# Patient Record
Sex: Male | Born: 1960
Health system: Southern US, Community
[De-identification: ages and names within clinical notes are randomized; demographics above are authoritative.]

## PROBLEM LIST (undated history)

## (undated) DIAGNOSIS — D649 Anemia, unspecified: Secondary | ICD-10-CM

## (undated) DIAGNOSIS — K219 Gastro-esophageal reflux disease without esophagitis: Secondary | ICD-10-CM

## (undated) DIAGNOSIS — F17200 Nicotine dependence, unspecified, uncomplicated: Secondary | ICD-10-CM

## (undated) DIAGNOSIS — N529 Male erectile dysfunction, unspecified: Secondary | ICD-10-CM

## (undated) DIAGNOSIS — I10 Essential (primary) hypertension: Secondary | ICD-10-CM

## (undated) DIAGNOSIS — G379 Demyelinating disease of central nervous system, unspecified: Secondary | ICD-10-CM

## (undated) DIAGNOSIS — R972 Elevated prostate specific antigen [PSA]: Secondary | ICD-10-CM

## (undated) HISTORY — DX: Nicotine dependence, unspecified, uncomplicated: F17.200

## (undated) HISTORY — DX: Male erectile dysfunction, unspecified: N52.9

## (undated) HISTORY — DX: Demyelinating disease of central nervous system, unspecified: G37.9

## (undated) HISTORY — DX: Elevated prostate specific antigen (PSA): R97.20

## (undated) HISTORY — DX: Essential (primary) hypertension: I10

## (undated) HISTORY — DX: Anemia, unspecified: D64.9

## (undated) HISTORY — PX: COLONOSCOPY: SHX174

## (undated) HISTORY — PX: UPPER GASTROINTESTINAL ENDOSCOPY: SHX188

## (undated) HISTORY — DX: Gastro-esophageal reflux disease without esophagitis: K21.9

---

## 2000-06-27 ENCOUNTER — Encounter: Payer: Self-pay | Admitting: Family Medicine

## 2001-07-03 ENCOUNTER — Encounter: Payer: Self-pay | Admitting: Family Medicine

## 2001-07-03 ENCOUNTER — Ambulatory Visit (HOSPITAL_COMMUNITY): Admission: RE | Admit: 2001-07-03 | Discharge: 2001-07-03 | Payer: Self-pay | Admitting: Family Medicine

## 2003-06-08 ENCOUNTER — Ambulatory Visit (HOSPITAL_COMMUNITY): Admission: RE | Admit: 2003-06-08 | Discharge: 2003-06-08 | Payer: Self-pay | Admitting: Family Medicine

## 2004-06-14 ENCOUNTER — Ambulatory Visit: Payer: Self-pay | Admitting: Family Medicine

## 2004-07-25 ENCOUNTER — Ambulatory Visit: Payer: Self-pay | Admitting: Family Medicine

## 2004-11-29 ENCOUNTER — Ambulatory Visit: Payer: Self-pay | Admitting: Family Medicine

## 2005-03-27 ENCOUNTER — Ambulatory Visit: Payer: Self-pay | Admitting: Family Medicine

## 2005-06-16 ENCOUNTER — Ambulatory Visit: Payer: Self-pay | Admitting: Family Medicine

## 2005-08-15 ENCOUNTER — Ambulatory Visit: Payer: Self-pay | Admitting: Family Medicine

## 2005-10-10 HISTORY — PX: CHOLECYSTECTOMY: SHX55

## 2005-10-28 ENCOUNTER — Emergency Department (HOSPITAL_COMMUNITY): Admission: EM | Admit: 2005-10-28 | Discharge: 2005-10-28 | Payer: Self-pay | Admitting: Emergency Medicine

## 2005-10-31 ENCOUNTER — Emergency Department (HOSPITAL_COMMUNITY): Admission: EM | Admit: 2005-10-31 | Discharge: 2005-10-31 | Payer: Self-pay | Admitting: Emergency Medicine

## 2005-11-01 ENCOUNTER — Ambulatory Visit: Payer: Self-pay | Admitting: Family Medicine

## 2005-11-02 ENCOUNTER — Inpatient Hospital Stay (HOSPITAL_COMMUNITY): Admission: AD | Admit: 2005-11-02 | Discharge: 2005-11-04 | Payer: Self-pay | Admitting: General Surgery

## 2005-11-02 ENCOUNTER — Ambulatory Visit (HOSPITAL_COMMUNITY): Admission: RE | Admit: 2005-11-02 | Discharge: 2005-11-02 | Payer: Self-pay | Admitting: Family Medicine

## 2005-11-03 ENCOUNTER — Encounter (INDEPENDENT_AMBULATORY_CARE_PROVIDER_SITE_OTHER): Payer: Self-pay | Admitting: Specialist

## 2005-11-09 ENCOUNTER — Ambulatory Visit: Payer: Self-pay | Admitting: Family Medicine

## 2005-12-25 ENCOUNTER — Ambulatory Visit: Payer: Self-pay | Admitting: Family Medicine

## 2006-04-26 ENCOUNTER — Ambulatory Visit: Payer: Self-pay | Admitting: Family Medicine

## 2006-04-30 ENCOUNTER — Ambulatory Visit (HOSPITAL_COMMUNITY): Admission: RE | Admit: 2006-04-30 | Discharge: 2006-04-30 | Payer: Self-pay | Admitting: Family Medicine

## 2006-08-17 ENCOUNTER — Encounter: Payer: Self-pay | Admitting: Family Medicine

## 2006-08-17 LAB — CONVERTED CEMR LAB
BUN: 15 mg/dL (ref 6–23)
Basophils Absolute: 0.1 10*3/uL (ref 0.0–0.1)
Calcium: 9.6 mg/dL (ref 8.4–10.5)
Chloride: 101 meq/L (ref 96–112)
Cholesterol: 162 mg/dL (ref 0–200)
Eosinophils Absolute: 0.3 10*3/uL (ref 0.0–0.7)
Eosinophils Relative: 7 % — ABNORMAL HIGH (ref 0–5)
Glucose, Bld: 78 mg/dL (ref 70–99)
HCT: 38.9 % — ABNORMAL LOW (ref 39.0–52.0)
HDL: 46 mg/dL (ref >39)
Hemoglobin: 13.2 g/dL (ref 13.0–17.0)
Lymphocytes Relative: 31 % (ref 12–46)
MCV: 84 fL (ref 78.0–100.0)
Monocytes Absolute: 0.6 10*3/uL (ref 0.2–0.7)
Monocytes Relative: 12 % — ABNORMAL HIGH (ref 3–11)
Neutro Abs: 2.5 10*3/uL (ref 1.7–7.7)
PSA: 2.21 ng/mL (ref 0.10–4.00)
Platelets: 262 10*3/uL (ref 150–400)
RBC: 4.63 M/uL (ref 4.22–5.81)
RDW: 13.5 % (ref 11.5–14.0)
WBC: 5 10*3/uL (ref 4.0–10.5)

## 2006-08-24 ENCOUNTER — Ambulatory Visit: Payer: Self-pay | Admitting: Family Medicine

## 2007-02-05 ENCOUNTER — Ambulatory Visit: Payer: Self-pay | Admitting: Family Medicine

## 2007-06-13 ENCOUNTER — Encounter: Payer: Self-pay | Admitting: Family Medicine

## 2007-06-26 ENCOUNTER — Ambulatory Visit: Payer: Self-pay | Admitting: Family Medicine

## 2007-06-26 ENCOUNTER — Ambulatory Visit (HOSPITAL_COMMUNITY): Admission: RE | Admit: 2007-06-26 | Discharge: 2007-06-26 | Payer: Self-pay | Admitting: Family Medicine

## 2007-06-26 LAB — CONVERTED CEMR LAB
Nitrite: NEGATIVE
Protein, ur: NEGATIVE mg/dL
Specific Gravity, Urine: 1.024 (ref 1.005–1.03)
Urine Glucose: NEGATIVE mg/dL
Urobilinogen, UA: 0.2 (ref 0.0–1.0)
pH: 6.5 (ref 5.0–8.0)

## 2007-09-06 ENCOUNTER — Encounter: Payer: Self-pay | Admitting: Family Medicine

## 2007-09-06 LAB — CONVERTED CEMR LAB
BUN: 13 mg/dL (ref 6–23)
CO2: 25 meq/L (ref 19–32)
Cholesterol: 148 mg/dL (ref 0–200)
Eosinophils Absolute: 0.6 10*3/uL (ref 0.0–0.7)
Eosinophils Relative: 12 % — ABNORMAL HIGH (ref 0–5)
Glucose, Bld: 87 mg/dL (ref 70–99)
HDL: 47 mg/dL (ref >39)
Hemoglobin: 14.1 g/dL (ref 13.0–17.0)
PSA: 2.06 ng/mL (ref 0.10–4.00)
Platelets: 206 10*3/uL (ref 150–400)
Potassium: 4.2 meq/L (ref 3.5–5.3)
RBC: 4.69 M/uL (ref 4.22–5.81)
Sodium: 139 meq/L (ref 135–145)
WBC: 4.9 10*3/uL (ref 4.0–10.5)

## 2007-12-27 DIAGNOSIS — F528 Other sexual dysfunction not due to a substance or known physiological condition: Secondary | ICD-10-CM

## 2007-12-27 DIAGNOSIS — I10 Essential (primary) hypertension: Secondary | ICD-10-CM

## 2007-12-27 DIAGNOSIS — G378 Other specified demyelinating diseases of central nervous system: Secondary | ICD-10-CM | POA: Insufficient documentation

## 2008-01-20 ENCOUNTER — Encounter: Payer: Self-pay | Admitting: Family Medicine

## 2008-01-31 ENCOUNTER — Ambulatory Visit: Payer: Self-pay | Admitting: Family Medicine

## 2008-07-01 ENCOUNTER — Encounter: Payer: Self-pay | Admitting: Family Medicine

## 2008-07-01 LAB — CONVERTED CEMR LAB
BUN: 12 mg/dL (ref 6–23)
CO2: 25 meq/L (ref 19–32)
Calcium: 8.9 mg/dL (ref 8.4–10.5)
Glucose, Bld: 88 mg/dL (ref 70–99)
Potassium: 4.4 meq/L (ref 3.5–5.3)

## 2008-07-07 ENCOUNTER — Ambulatory Visit: Payer: Self-pay | Admitting: Family Medicine

## 2008-07-07 DIAGNOSIS — I739 Peripheral vascular disease, unspecified: Secondary | ICD-10-CM

## 2008-07-10 ENCOUNTER — Ambulatory Visit (HOSPITAL_COMMUNITY): Admission: RE | Admit: 2008-07-10 | Discharge: 2008-07-10 | Payer: Self-pay | Admitting: Family Medicine

## 2008-12-28 ENCOUNTER — Ambulatory Visit: Payer: Self-pay | Admitting: Family Medicine

## 2008-12-28 DIAGNOSIS — R972 Elevated prostate specific antigen [PSA]: Secondary | ICD-10-CM

## 2008-12-28 LAB — CONVERTED CEMR LAB
Basophils Absolute: 0 10*3/uL (ref 0.0–0.1)
Basophils Relative: 0 % (ref 0–1)
Eosinophils Absolute: 0.3 10*3/uL (ref 0.0–0.7)
Eosinophils Relative: 3 % (ref 0–5)
Hemoglobin: 13 g/dL (ref 13.0–17.0)
Lymphocytes Relative: 14 % (ref 12–46)
Lymphs Abs: 1.3 10*3/uL (ref 0.7–4.0)
MCHC: 34.7 g/dL (ref 30.0–36.0)
Monocytes Relative: 12 % (ref 3–12)
Neutro Abs: 6.6 10*3/uL (ref 1.7–7.7)
Neutrophils Relative %: 70 % (ref 43–77)
PSA: 5.24 ng/mL — ABNORMAL HIGH (ref 0.10–4.00)
Platelets: 267 10*3/uL (ref 150–400)
RBC: 4.4 M/uL (ref 4.22–5.81)
RDW: 14.1 % (ref 11.5–15.5)
TSH: 0.668 microintl units/mL (ref 0.350–4.500)
WBC: 9.3 10*3/uL (ref 4.0–10.5)

## 2009-01-06 ENCOUNTER — Encounter: Payer: Self-pay | Admitting: Family Medicine

## 2009-01-18 ENCOUNTER — Encounter: Payer: Self-pay | Admitting: Family Medicine

## 2009-04-13 ENCOUNTER — Ambulatory Visit: Payer: Self-pay | Admitting: Family Medicine

## 2009-04-13 LAB — CONVERTED CEMR LAB
BUN: 10 mg/dL (ref 6–23)
CO2: 25 meq/L (ref 19–32)
Calcium: 8.9 mg/dL (ref 8.4–10.5)
Chloride: 101 meq/L (ref 96–112)
Cholesterol: 165 mg/dL (ref 0–200)
Creatinine, Ser: 1.17 mg/dL (ref 0.40–1.50)
HDL: 51 mg/dL (ref >39)
LDL Cholesterol: 99 mg/dL (ref 0–99)
Sodium: 137 meq/L (ref 135–145)
Triglycerides: 75 mg/dL (ref <150)
VLDL: 15 mg/dL (ref 0–40)

## 2009-06-02 ENCOUNTER — Emergency Department (HOSPITAL_COMMUNITY): Admission: EM | Admit: 2009-06-02 | Discharge: 2009-06-02 | Payer: Self-pay | Admitting: Emergency Medicine

## 2009-09-13 ENCOUNTER — Ambulatory Visit: Payer: Self-pay | Admitting: Family Medicine

## 2010-05-09 LAB — CONVERTED CEMR LAB
BUN: 10 mg/dL (ref 6–23)
Basophils Absolute: 0 10*3/uL (ref 0.0–0.1)
Eosinophils Absolute: 0.2 10*3/uL (ref 0.0–0.7)
Hemoglobin: 13.4 g/dL (ref 13.0–17.0)
Lymphocytes Relative: 39 % (ref 12–46)
Neutrophils Relative %: 37 % — ABNORMAL LOW (ref 43–77)
Potassium: 4.2 meq/L (ref 3.5–5.3)
Sodium: 136 meq/L (ref 135–145)
TSH: 0.97 microintl units/mL (ref 0.350–4.500)
Vit D, 25-Hydroxy: 14 ng/mL — ABNORMAL LOW (ref 30–89)
WBC: 4.5 10*3/uL (ref 4.0–10.5)

## 2010-05-12 ENCOUNTER — Ambulatory Visit: Payer: Self-pay | Admitting: Family Medicine

## 2010-05-15 DIAGNOSIS — F17200 Nicotine dependence, unspecified, uncomplicated: Secondary | ICD-10-CM

## 2010-07-03 ENCOUNTER — Encounter: Payer: Self-pay | Admitting: Family Medicine

## 2010-07-12 NOTE — Assessment & Plan Note (Signed)
Summary: office visit   Vital Signs:  Patient profile:   50 year old male Height:      68 inches Weight:      159.75 pounds BMI:     24.38 O2 Sat:      99 % on Room air Pulse rate:   70 / minute Pulse rhythm:   regular Resp:     16 per minute BP sitting:   120 / 78  (left arm)  Vitals Entered By: Adella Hare LPN (May 12, 2010 9:32 AM)  O2 Flow:  Room air CC: follow-up visit Is Patient Diabetic? No Pain Assessment Patient in pain? no        CC:  follow-up visit.  History of Present Illness: Reports  that he has been doing well. Denies recent fever or chills. Denies sinus pressure, nasal congestion , ear pain or sore throat. Denies chest congestion, or cough productive of sputum. Denies chest pain, palpitations, PND, orthopnea or leg swelling. Denies abdominal pain, nausea, vomitting, diarrhea or constipation. Denies change in bowel movements or bloody stool. Denies dysuria , frequency, incontinence or hesitancy. Denies  joint pain or swelling, has chronic reduced  mobility in both lower extremites and weakness, secondary to neurologic disorder Denies headaches, vertigo, seizures. Denies depression, anxiety or insomnia. Denies  rash, lesions, or itch.     Preventive Screening-Counseling & Management  Alcohol-Tobacco     Smoking Cessation Counseling: yes  Current Medications (verified): 1)  Hydrochlorothiazide 12.5 Mg  Tabs (Hydrochlorothiazide) .... One Tab By Mouth Once Daily 2)  Diltzac 360 Mg  Xr24h-Cap (Diltiazem Hcl Er Beads) .... Once Cap By Mouth Once Daily 3)  Klor-Con M10 10 Meq  Cr-Tabs (Potassium Chloride Crys Cr) .... One Tab By Mouth Once Daily 4)  Flonase 50 Mcg/act Susp (Fluticasone Propionate) .... 2 Puffs Per Nostril Daily As Needed  Allergies (verified): No Known Drug Allergies  Review of Systems      See HPI Eyes:  Denies discharge, eye pain, and red eye. Endo:  Denies cold intolerance, excessive thirst, excessive urination, and  polyuria. Heme:  Denies abnormal bruising, bleeding, fevers, and skin discoloration. Allergy:  Denies hives or rash and itching eyes.  Physical Exam  General:  Well-developed,well-nourished,in no acute distress; alert,appropriate and cooperative throughout examination HEENT: No facial asymmetry,  EOMI, No sinus tenderness, TM's Clear, oropharynx  pink and moist.   Chest: Clear to auscultation bilaterally.  CVS: S1, S2, No murmurs, No S3.   Abd: Soft, Nontender.  MS: Adequate ROM spine, hips, shoulders and knees.  Ext: No edema.   CNS: CN 2-12 intact,reduced  power and  tone in lower extremities  Skin: Intact, no visible lesions or rashes.  Psych: Good eye contact, normal affect.  Memory intact, not anxious or depressed appearing.    Impression & Recommendations:  Problem # 1:  HYPERTENSION (ICD-401.9) Assessment Comment Only  His updated medication list for this problem includes:    Hydrochlorothiazide 12.5 Mg Tabs (Hydrochlorothiazide) ..... One tab by mouth once daily    Diltzac 360 Mg Xr24h-cap (Diltiazem hcl er beads) ..... 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   Complete Medication List: 1)  Hydrochlorothiazide 12.5 Mg Tabs (Hydrochlorothiazide) .... One tab by mouth  once daily 2)  Diltzac 360 Mg Xr24h-cap (Diltiazem hcl er beads) .... Once cap by mouth once daily 3)  Klor-con M10 10 Meq Cr-tabs (Potassium chloride crys cr) .... One tab by mouth once daily 4)  Flonase 50 Mcg/act Susp (Fluticasone propionate) .... 2 puffs per nostril daily as needed 5)  Multivitamins Tabs (Multiple vitamin) .... Take 1 tablet by mouth once a day 6)  Aspir-low 81 Mg Tbec (Aspirin) .... Take 1 tablet by mouth once a day  Patient Instructions: 1)  Follow up appointment in 5.28months 2)  Labs are excellent, no med changes. 3)  Tobacco is very bad for your health and your loved ones! You Should stop smoking!. 4)  Stop Smoking Tips: Choose a Quit date. Cut down before the Quit date. decide what you will do as a substitute when you feel the urge to smoke(gum,toothpick,exercise). 5)  Current is 5/day. 6)  Quit date is [place], 2012 7)  New meds asprin, and a multivit Prescriptions: KLOR-CON M10 10 MEQ  CR-TABS (POTASSIUM CHLORIDE CRYS CR) one tab by mouth once daily  #30 x 5   Entered by:   Adella Hare LPN   Authorized by:   Syliva Overman MD   Signed by:   Adella Hare LPN on 16/03/9603   Method used:   Electronically to        [facility] [address] [phone]* (retail)       [address], [place]  [address]       Ph: [phone]       Fax: [phone]   RxID:   [id number] HYDROCHLOROTHIAZIDE 12.5 MG  TABS (HYDROCHLOROTHIAZIDE) one tab by mouth once daily  #30 x 5   Entered by:   Adella Hare LPN   Authorized by:   Syliva Overman MD   Signed by:   Adella Hare LPN on 53/66/4403   Method used:   Electronically to        [facility] [phone]* (retail)       [address] [place], [place]  [address]       Ph: [phone]       Fax: [phone]   RxID:   [id number] DILTZAC 360 MG  XR24H-CAP (DILTIAZEM HCL ER BEADS) once cap by mouth once daily  #30.0 Each x 5   Entered by:   Adella Hare LPN   Authorized by:   Syliva Overman  MD   Signed by:   Adella Hare LPN on 25/42/7062   Method used:   Electronically to        [facility] [phone]* (retail)       [address] [place], [place]  [address]       Ph: [phone]       Fax: [phone]   RxID:   [id number] ASPIR-LOW 81 MG TBEC (ASPIRIN) Take 1 tablet by mouth once a day  #30 x 11   Entered and Authorized by:   Syliva Overman MD   Signed by:  Syliva Overman MD on 05/12/2010   Method used:   Electronically to        [facility]. [phone]* (retail)       [address] [place]  [address]       Ph: [phone]       Fax: [phone]   RxID:   [id number] MULTIVITAMINS  TABS (MULTIPLE VITAMIN) Take 1 tablet by mouth once a day  #30 x 11   Entered and Authorized by:   Syliva Overman MD   Signed by:   Syliva Overman MD on 05/12/2010   Method used:   Electronically to        [facility] [address] [phone]* (retail)       [address] [place], [place]  [address]       Ph: [phone]       Fax: [phone]   RxID:   [id number]    Orders Added: 1)  Est. Patient Level IV [85462] 2)  Medicare Electronic Prescription [phone]

## 2010-07-12 NOTE — Assessment & Plan Note (Signed)
Summary: PHY   Vital Signs:  Patient profile:   50 year old male Height:      68 inches Weight:      160.50 pounds BMI:     24.49 O2 Sat:      98 % Pulse rate:   87 / minute Pulse rhythm:   regular Resp:     16 per minute BP sitting:   130 / 86  (left arm) Cuff size:   regular  Vitals Entered By: Everitt Amber LPN (September 13, 452 8:05 AM) CC: CPE Is Patient Diabetic? No   CC:  CPE.  History of Present Illness: Reports  that he has been  doing well. Denies recent fever or chills. Denies sinus pressure, nasal congestion , ear pain or sore throat. Denies chest congestion, or cough productive of sputum. Denies chest pain, palpitations, PND, orthopnea or leg swelling. Denies abdominal pain, nausea, vomitting, diarrhea or constipation. Denies change in bowel movements or bloody stool. Denies dysuria , frequency, incontinence or hesitancy. reports   joint pain, and  reduced mobilityof right lower extremity.Reduced power in toes bilaterally, which is chronic Denies headaches, vertigo, seizures. Denies depression, anxiety or insomnia.He has recently been under increased stress because of illness in family members, which is impproving.  Denies  rash, lesions, or itch.     Preventive Screening-Counseling & Management  Alcohol-Tobacco     Smoking Cessation Counseling: yes  Current Medications (verified): 1)  Hydrochlorothiazide 12.5 Mg  Tabs (Hydrochlorothiazide) .... One Tab By Mouth Once Daily 2)  Diltzac 360 Mg  Xr24h-Cap (Diltiazem Hcl Er Beads) .... Once Cap By Mouth Once Daily 3)  Klor-Con M10 10 Meq  Cr-Tabs (Potassium Chloride Crys Cr) .... One Tab By Mouth Once Daily  Allergies (verified): No Known Drug Allergies  Review of Systems      See HPI General:  Complains of sleep disorder; difficulty sleeping lying down over the past several weeks, alot of his close family members got sick but they are all doing better. Eyes:  Denies discharge, eye pain, and red eye. ENT:   Complains of hoarseness and nasal congestion; denies sinus pressure and sore throat; 3 to 4 ciggs per day x 2 months. CV:  Denies chest pain or discomfort, lightheadness, near fainting, palpitations, and shortness of breath with exertion. Resp:  Complains of shortness of breath; denies cough, sputum productive, and wheezing. GI:  Denies abdominal pain. GU:  Complains of erectile dysfunction; denies dysuria and urinary frequency; uses a pump. MS:  Complains of joint pain and stiffness; denies low back pain and mid back pain; pain and stiffness in both feet and toes. Derm:  Denies itching, lesion(s), and rash. Neuro:  Denies headaches, seizures, and sensation of room spinning. Psych:  Complains of anxiety; denies depression, suicidal thoughts/plans, and thoughts of violence. Endo:  Denies cold intolerance, excessive hunger, excessive thirst, excessive urination, heat intolerance, polyuria, and weight change. Heme:  Denies abnormal bruising and bleeding. Allergy:  Complains of seasonal allergies; denies hives or rash and itching eyes; increased allergy symptoms x 3 weeks, eyes puffy.  Physical Exam  General:  Well-developed,well-nourished,in no acute distress; alert,appropriate and cooperative throughout examination Head:  Normocephalic and atraumatic without obvious abnormalities. No apparent alopecia or balding. Eyes:  No corneal or conjunctival inflammation noted. EOMI. Perrla. Funduscopic exam benign, without hemorrhages, exudates or papilledema. Vision grossly normal. Ears:  External ear exam shows no significant lesions or deformities.  Otoscopic examination reveals clear canals, tympanic membranes are intact bilaterally without bulging,  retraction, inflammation or discharge. Hearing is grossly normal bilaterally. Nose:  External nasal examination shows no deformity or inflammation. Nasal mucosa are pink and moist without lesions or exudates. Mouth:  pharynx pink and moist and teeth missing.     Neck:  No deformities, masses, or tenderness noted. Chest Wall:  No deformities, masses, tenderness or gynecomastia noted. Breasts:  No masses or gynecomastia noted Lungs:  Normal respiratory effort, chest expands symmetrically. Lungs are clear to auscultation, no crackles or wheezes. Heart:  Normal rate and regular rhythm. S1 and S2 normal without gallop, murmur, click, rub or other extra sounds. Abdomen:  Bowel sounds positive,abdomen soft and non-tender without masses, organomegaly or hernias noted. Rectal:  No external abnormalities noted. Normal sphincter tone. No rectal masses or tenderness.Guaic neg stool Genitalia:  deferred, dt sees urology Prostate:  Prostate gland firm and smooth, no enlargement, nodularity, tenderness, mass, asymmetry or induration. Msk:  No deformity or scoliosis noted of thoracic or lumbar spine.   Pulses:  R and L carotid,radial,femoral,dorsalis pedis and posterior tibial pulses are full and equal bilaterally Extremities:  decreasd power in toes grade 5 power throughoutotherwise. Neurologic:  alert & oriented X3, cranial nerves II-XII intact, and sensation intact to light touch.   Skin:  Intact without suspicious lesions or rashes Cervical Nodes:  No lymphadenopathy noted Axillary Nodes:  No palpable lymphadenopathy Inguinal Nodes:  No significant adenopathy Psych:  Cognition and judgment appear intact. Alert and cooperative with normal attention span and concentration. No apparent delusions, illusions, hallucinations   Impression & Recommendations:  Problem # 1:  HYPERTENSION (ICD-401.9) Assessment Deteriorated  His updated medication list for this problem includes:    Hydrochlorothiazide 12.5 Mg Tabs (Hydrochlorothiazide) ..... One tab by mouth once daily    Diltzac 360 Mg Xr24h-cap (Diltiazem hcl er beads) ..... Once cap by mouth once daily  Orders: T-Basic Metabolic Panel [phone])  BP today: 130/86 Prior BP: 120/80 (04/13/2009)  Labs  Reviewed: K+: 3.8 (04/13/2009) Creat: : 1.17 (04/13/2009)   Chol: 165 (04/13/2009)   HDL: 51 (04/13/2009)   LDL: 99 (04/13/2009)   TG: 75 (04/13/2009)  Problem # 2:  OTHER DEMYELINATING DISEASES OF CNTRL NERV SYS (ICD-341.8) Assessment: Unchanged  Problem # 3:  ERECTILE DYSFUNCTION (ICD-302.72) Assessment: Unchanged uses a pump  Complete Medication List: 1)  Hydrochlorothiazide 12.5 Mg Tabs (Hydrochlorothiazide) .... One tab by mouth once daily 2)  Diltzac 360 Mg Xr24h-cap (Diltiazem hcl er beads) .... Once cap by mouth once daily 3)  Klor-con M10 10 Meq Cr-tabs (Potassium chloride crys cr) .... One tab by mouth once daily 4)  Flonase 50 Mcg/act Susp (Fluticasone propionate) .... 2 puffs per nostril daily as needed  Other Orders: T-CBC w/Diff [id number]) T-Vitamin D (25-Hydroxy) [phone]) T-TSH [phone]) Hemoccult Guaiac-1 spec.(in office) (40347)  Patient Instructions: 1)  F/U in 5.5 months. 2)  It is important that you exercise regularly at least 20 minutes 5 times a week. If you develop chest pain, have severe difficulty breathing, or feel very tired , stop exercising immediately and seek medical attention. 3)  Tobacco is very bad for your health and your loved ones! You Should stop smoking!. 4)  Stop Smoking Tips: Choose a Quit date. Cut down before the Quit date. decide what you will do as a substitute when you feel the urge to smoke(gum,toothpick,exercise). 5)  BMP prior to visit, ICD-9: 6)  TSH prior to visit, ICD-9:  fasting in 5 ,5 months 7)  CBC w/ Diff prior to visit, ICD-9: 8)  Vit D level 9)   keep appt with urology as scheduled 10)  ew med for ALLERGIES Prescriptions: KLOR-CON M10 10 MEQ  CR-TABS (POTASSIUM CHLORIDE CRYS CR) one tab by mouth once daily  #30 x 5   Entered by:   Everitt Amber LPN   Authorized by:   Syliva Overman MD   Signed by:   Everitt Amber LPN on 16/03/9603   Method used:   Electronically to        [facility] [address] [phone]*  (retail)       [address] [place], [place]  [address]       Ph: [phone]       Fax: [phone]   RxID:   [id number] DILTZAC 360 MG  XR24H-CAP (DILTIAZEM HCL ER BEADS) once cap by mouth once daily  #30.0 Each x 5   Entered by:   Everitt Amber LPN   Authorized by:   Syliva Overman MD   Signed by:   Everitt Amber LPN on 53/66/4403   Method used:   Electronically to        [facility] [address] [phone]* (retail)       [address], [place]  [address]       Ph: [phone]       Fax: [phone]   RxID:   [id number] HYDROCHLOROTHIAZIDE 12.5 MG  TABS (HYDROCHLOROTHIAZIDE) one tab by mouth once daily  #30 x 5   Entered by:   Everitt Amber LPN   Authorized by:   Syliva Overman MD   Signed by:   Everitt Amber LPN on 25/42/7062   Method used:   Electronically to        [facility] [address] [phone]* (retail)       [address] [place], [place]  [address]       Ph: [phone]       Fax: [phone]   RxID:   [id number] FLONASE 50 MCG/ACT SUSP (FLUTICASONE PROPIONATE) 2 puffs per nostril daily as needed  #1 x 5   Entered and Authorized by:   Syliva Overman MD   Signed by:   Syliva Overman MD on 09/13/2009   Method used:   Electronically to        [facility] [address] [phone]* (retail)       [address] [place], [place]  [address]       Ph: [phone]       Fax: [phone]   RxID:   [id number]      Laboratory Results    Stool - Occult Blood Hemmoccult #1: positive Date: 09/13/2009 Comments: [id number] 9r 8/11 118 10 12

## 2010-07-12 NOTE — Letter (Signed)
Summary: demographic  demographic   Imported By: Curtis Sites 12/01/2009 11:48:34  _____________________________________________________________________  External Attachment:    Type:   Image     Comment:   External Document

## 2010-07-12 NOTE — Letter (Signed)
Summary: misc.  misc.   Imported By: Curtis Sites 12/01/2009 11:49:54  _____________________________________________________________________  External Attachment:    Type:   Image     Comment:   External Document

## 2010-07-12 NOTE — Letter (Signed)
Summary: xray  xray   Imported By: Curtis Sites 12/01/2009 11:50:43  _____________________________________________________________________  External Attachment:    Type:   Image     Comment:   External Document

## 2010-07-12 NOTE — Letter (Signed)
Summary: history and physical  history and physical   Imported By: Curtis Sites 12/01/2009 11:49:17  _____________________________________________________________________  External Attachment:    Type:   Image     Comment:   External Document

## 2010-07-12 NOTE — Letter (Signed)
Summary: progress notes  progress notes   Imported By: Curtis Sites 12/01/2009 11:50:25  _____________________________________________________________________  External Attachment:    Type:   Image     Comment:   External Document

## 2010-07-12 NOTE — Letter (Signed)
Summary: lab  lab   Imported By: Curtis Sites 12/01/2009 11:49:38  _____________________________________________________________________  External Attachment:    Type:   Image     Comment:   External Document

## 2010-10-28 NOTE — Op Note (Signed)
NAME:  Craig Shaffer, Craig Shaffer NO.:  [id number]   MEDICAL RECORD NO.:  [id number]          PATIENT TYPE:  INP   LOCATION:  A336                          FACILITY:  [facility]   PHYSICIAN:  Dirk Dress. Katrinka Blazing, M.D.   DATE OF BIRTH:  Oct 06, 1960   DATE OF PROCEDURE:  11/03/2005  DATE OF DISCHARGE:                                 OPERATIVE REPORT   PREOPERATIVE DIAGNOSIS:  Acute cholecystitis and cholelithiasis.   POSTOPERATIVE DIAGNOSIS:  Acute cholecystitis and cholelithiasis   PROCEDURE:  Laparoscopic cholecystectomy.   SURGEON:  Dirk Dress. Katrinka Blazing, M.D.   DESCRIPTION:  Under general anesthesia, the patient's abdomen was prepped  and draped in a sterile field.  A supraumbilical incision was made and the  Veress needle was inserted uneventfully.  The abdomen was insufflated with 2  liters of CO2.  Using a Visiport guide, a 10 mm port was placed.  The  laparoscope was placed.  The gallbladder was visualized.  The patient was  placed in reversed Trendelenburg position.  Under videoscopic guidance, a 2  mm port and two 5 mm ports were placed in the right subcostal region.  The  gallbladder was grasped and positioned.  There was acute inflammation with  thickening of the wall of the gallbladder.  There were adhesions of omentum  to the gallbladder.  These adhesions were taken down bluntly with the use of  electrocautery.  The gallbladder was further grasped and positioned.  The  cystic duct was dissected back to the gallbladder.  It was clipped with five  clips and divided.  The cystic artery had two branches.  Each was clipped  with three clips and divided.  The gallbladder was then separated from the  hepatic bed with electrocautery.  The gallbladder was placed in an EndoCatch  device and removed.  Irrigation was carried out.  There was minimal bleeding  but there was some edema in the area.  CO2 was allowed to escape from the  abdomen and the ports were removed.  The abdomen was closed  using 0 Vicryl  on the fascia of the umbilicus and staples on skin.  The patient tolerated  the procedure well.  Dressings were placed.  He was awakened from anesthesia  uneventfully and transferred to a bed and taken to the post anesthesia care  unit for further monitoring.      Dirk Dress. Katrinka Blazing, M.D.  Electronically Signed     LCS/MEDQ  D:  11/03/2005  T:  11/04/2005  Job:  [id number]   cc:   Milus Mallick. Lodema Hong, M.D.  Fax: [phone]

## 2010-10-28 NOTE — Discharge Summary (Signed)
NAME:  Craig Shaffer, Craig Shaffer NO.:  [id number]   MEDICAL RECORD NO.:  [id number]          PATIENT TYPE:  INP   LOCATION:  A336                          FACILITY:  [facility]   PHYSICIAN:  Annia Friendly. Loleta Chance, MD     DATE OF BIRTH:  11-16-1960   DATE OF ADMISSION:  11/02/2005  DATE OF DISCHARGE:  05/26/2007LH                                 DISCHARGE SUMMARY   PRIMARY CARE PHYSICIAN:  Milus Mallick. Lodema Hong, M.D.   SURGEON:  Dr. Elpidio Anis.   PROCEDURE:  Laparoscopic cholecystectomy on Nov 03, 2005 due to acute  cholecystitis/cholelithiasis.   The patient was a 50 year old single, disabled black male from [place],  [place].  Chief complaints were nausea, vomiting and stomach pain  over six days.  The patient had a ultrasound of the abdomen on Nov 02, 2005  revealing cholelithiasis including gallstones, fixed in position, at the  gallbladder neck with associated thickened gallbladder wall.  History was  negative for fever, chills, diarrhea, melena, hematemesis and chest  discomfort.  Medical history was positive for neurological disorder  discovered at [facility] that required him to wear  plastic bilateral heel splints to avoid foot drop and hypertension.  The  patient was not allergic to any known medication.   Habits were positive cigarettes smoking since age 29 (six per day) and  former use of ethanol since April 1999.   Family history revealed mother living age 70, history of hypertension.  Father deceased at age 110 due to accident at weak.  Two brothers living at  age 16 in good health; age 8 with history of one kidney.  Five sisters  living at age 84 with history of hypertension, age 5 with history of  hypertension, age 14 good health, age 50 good health, age 83 good health.   CHIEF COMPLAINT:  Problem 1:  ABDOMINAL PAIN WITH EMESIS SECONDARY TO  CHOLECYSTITIS/CHOLELITHIASIS:  General appearance revealed a middle-aged  slight small frame  medium height, alert black man in no apparent respiratory  distress.  Vitals on admission were as follow:  Temperature 98.6, pulse 66,  respiration 20, blood pressure 113/67.  Lungs were clear.  Heart revealed  obvious S1 and S2 without murmur.  Rhythm was regular and rate within normal  limits.  Palpation of costovertebral angle demonstrated no tenderness.  Abdomen revealed no distention with hypoactive bowel sounds.  Abdomen was  soft and positive for mid-epigastric and right upper quadrant tenderness on  palpation.  The patient had a positive Murphy's sign.  Rectal exam was  deferred.   The patient was evaluated by Dr. Elpidio Anis, surgeon, on admission.  His  exam revealed that the patient needed removal of his gallbladder.  The  patient underwent laparoscopic cholecystectomy on Nov 03, 2005 without  complications.  White count on admission was 4700.  Erythrocyte  sedimentation rate was 5. The patient's hospital course was uneventful.  Repeat white count on Nov 04, 2005 was 6600.  Electrolytes were within  normal limits on the day of discharge (sodium 136, potassium 3.9, chloride  107, CO2 27).  The patient was alert and oriented to person, place and time at the time of  discharge.  Lung fields were clear.  Heart exam was within normal limits.  Abdominal exam demonstrated no significant distention.  His abdomen was soft  and positive for tenderness around wound sites only.  The patient was  passing flatus rectally.  He was tolerating a regular diet without problems.  Therefore, the patient was discharged to his home on Nov 04, 2005.   Problem 2:  HYPERTENSION:  Blood pressure on admission was 113/67.  The  patient required no antihypertensive medication during this hospitalization.  He will follow up with his family doctor for restarting of antihypertensive  medication.  Diet will be low sodium and low cholesterol.   Problem 3:  CHRONIC NEUROLOGICAL DISORDER WITH BILATERAL FOOT  DROP:  The  patient was on no medication.  He will continue to wear his plastic heel  splints as before admission.  He will follow up with his family doctor  pertaining to this chronic neurological disorder.  Examination of the lower  extremity demonstrated no significant atrophy.  The patient's sensation was  intact.  He was observed to take very high steps with a slightly wide gait  on admission.   FINAL PRIMARY DIAGNOSIS:  Acute abdominal pain with emesis secondary to  cholecystitis/cholelithiasis.   SECONDARY DIAGNOSES:  1.  Hypertension.  2.  Chronic neurological disorder with bilateral foot drop.   DIET:  A 4 gm sodium, low-cholesterol.   ACTIVITY:  No driving x1 week.  No lifting x4 weeks.  Increased activity  slowly.   MEDICATIONS:  1.  Tylox 1-2 tablets every four hours as needed for pain.  2.  Zantac 150 mg p.o. daily.  3.  Reglan 10 mg 1 tablet 30 minutes before meals and bed time.   FOLLOW UP:  Dr. Maggie Schwalbe, surgeon, in two weeks.  Follow up with Dr.  Syliva Overman in one week for evaluation of blood pressure.      Annia Friendly. Loleta Chance, MD  Electronically Signed     GKH/MEDQ  D:  11/04/2005  T:  11/05/2005  Job:  [id number]

## 2010-10-28 NOTE — H&P (Signed)
NAME:  Craig Shaffer, BUCKLES NO.:  [id number]   MEDICAL RECORD NO.:  [id number]          PATIENT TYPE:  INP   LOCATION:  A336                          FACILITY:  [facility]   PHYSICIAN:  Annia Friendly. Craig Chance, MD     DATE OF BIRTH:  12/16/1960   DATE OF ADMISSION:  11/02/2005  DATE OF DISCHARGE:  LH                                HISTORY & PHYSICAL   IDENTIFYING DATA:  The patient is a 50 year old single, disabled black male  from [place], [place].   CHIEF COMPLAINT:  Nausea, vomiting and stomach pain for over 6 days.   HISTORY OF PRESENT ILLNESS:  The pain started on Oct 28, 2005 with nausea  and vomiting. The pain was described as sharp, aching and was located in the  right upper quadrant of her abdomen. The pain increased in severity  therefore patient went to the emergency room for evaluation on Oct 28, 2005  and Oct 31, 2005. He also experienced vomiting which he described as food  stuffand a  brownish liquid which had a bitter taste. The pain did not  radiate through the stomach to back or under the shoulderblade. The patient  denies fever, chills, diarrhea, melena, hematemesis, and chest pain. An  acute abdominal series on Oct 31, 2005 by ER revealed minimal small bowel  ileus. The patient was seen by his family doctor, Dr. Syliva Shaffer, on  Oct 31, 2005. Dr. Lodema Hong ordered an ultrasound of the abdomen on Nov 02, 2005, revealing cholelithiasis including gallstones fixed in position at the  gallbladder neck with associated thickened gallbladder wall.   PAST MEDICAL HISTORY:  1.  Positive for a neurological disorder discovered at [facility] that required him to wear plastic bilateral heel      splints to avoid foot drop.  2.  Hypertension.  3.  Medical history is negative for diabetes, tuberculosis, cancer, sickle      cell, asthma and seizure disorder.   MEDICATIONS:  1.  Diltiazem 360 mg p.o. daily.  2.  Klor-Con ER 10 mEq p.o.  daily.  3.  Hydrochlorothiazide 12.5 mg p.o. daily.   ALLERGIES:  No known drug allergies.   HABITS:  Positive for cigarette smoking since age 46 (6 per day), and former  use of ethanol since April, 1999.  Negative for use of street drugs. No sexually transmitted disease(  gonorrhea,  syphilis, herpes or HIV infection).   FAMILY HISTORY:  Mother is living at age 20 with history of hypertension.  Father deceased at age 61 due to an accident at work; two brothers living,  ages 24 (good health) and age 15 with history of one kidney. Five sisters  living at ages 8, (good health), age 44 (good health), age 64 (good  health), age 32 with history of hypertension, age 32 with history of  hypertension. The patient has no children.   REVIEW OF SYSTEMS:  Positive for chronic bilateral foot drop. Review of  systems negative for chronic headache, epistaxis, dysphagia, chronic cough,  hemoptysis, syncope, dizziness, dysuria, gross hematuria, melena, leg  edema,  unexplained fever, weight loss, etc.   PHYSICAL EXAMINATION:  GENERAL:  Middle-aged, slight small framed, medium  height, black male in no apparent respiratory distress.  VITAL SIGNS:  Temperature is 98.6, pulse 66, respiration 20, blood pressure  113/67.  SKIN:  Warm and dry.  HEENT:  Ears normal. Eyes - lids negative for ptosis, sclerae white, pupils  are equal, round and reactive to light. Extraocular movements are intact.  Nose negative for discharge. Mouth - No oral lesion, positive for a broken  tooth, no bleeding gums  NECK:  Negative for lymphadenopathy or thyromegaly.  LUNGS:  Clear.  BACK:  No CVA tenderness.  HEART:  Audible S1, S2 without murmur. Regular rate and rhythm.  ABDOMEN:  No distention. Hypoactive bowel sounds, soft, positive mid  epigastric and right upper quadrant tenderness on palpation. Positive  Murphy's sign.  GENITALIA:  Penis uncircumcised, no penile lesion or discharge. Scrotum,  testicles without  nodules or tenderness.  RECTAL:  No external lesions.  EXTREMITIES:  No edema of feet, palpable dorsalis pedis bilaterally.  NEUROLOGIC:  Alert and oriented to person, place and time. Cranial nerves II-  XII grossly intact.  Gait - patient observed to take high steps with a slightly wide gait.   LABORATORY DATA:  White count 4700, hemoglobin 14.1, hematocrit 41.2,  platelets 202,000. Sodium 136, potassium 3.6, chloride 102, CO2 28, glucose  96, BUN 9, creatinine 1.1, calcium 9.1. AST 17, ALT 17, ALP 69, total  bilirubin 0.4, serum amylase 110, serum lipase 17.   IMPRESSION:  Acute abdominal pain secondary to cholelithiasis.   SECONDARY DIAGNOSES:  1.  Hypertension.  2.  Chronic neurologic disorder with bilateral foot drop.   PLAN:  Laparoscopic cholecystectomy by Dr. Elpidio Anis on Nov 03, 2005. The  purpose of the procedure and its complication has been discussed with the  patient by Dr. Elpidio Anis. Continue antihypertensive medications, clear  liquid diet today and n.p.o. after midnight. Urinalysis, IV fluids,  analgesia IV for pain.   ACTIVITY:  As tolerated.  Chest x-ray on Nov 02, 2005 revealed a normal heart size, mediastinum  contour and vascularity; mild bronchitic changes; no infiltrate, effusion or  pneumothorax, unremarkable as read by Dr. Ulyses Southward.  EKG is read as normal sinus rhythm, normal EKG. Ventricular rate is 62,  normal axis.     Annia Friendly. Craig Chance, MD  Electronically Signed    GKH/MEDQ  D:  11/02/2005  T:  11/02/2005  Job:  [id number]

## 2010-11-02 ENCOUNTER — Encounter: Payer: Self-pay | Admitting: Family Medicine

## 2010-11-03 ENCOUNTER — Encounter: Payer: Self-pay | Admitting: Family Medicine

## 2010-11-08 ENCOUNTER — Other Ambulatory Visit: Payer: Self-pay | Admitting: Family Medicine

## 2010-11-08 ENCOUNTER — Ambulatory Visit (INDEPENDENT_AMBULATORY_CARE_PROVIDER_SITE_OTHER): Payer: Medicare Other | Admitting: Family Medicine

## 2010-11-08 ENCOUNTER — Encounter: Payer: Self-pay | Admitting: Family Medicine

## 2010-11-08 VITALS — BP 120/82 | HR 77 | Resp 16 | Ht 69.0 in | Wt 161.1 lb

## 2010-11-08 DIAGNOSIS — F17200 Nicotine dependence, unspecified, uncomplicated: Secondary | ICD-10-CM

## 2010-11-08 DIAGNOSIS — I10 Essential (primary) hypertension: Secondary | ICD-10-CM

## 2010-11-08 DIAGNOSIS — R1013 Epigastric pain: Secondary | ICD-10-CM

## 2010-11-08 DIAGNOSIS — R972 Elevated prostate specific antigen [PSA]: Secondary | ICD-10-CM

## 2010-11-08 DIAGNOSIS — Z79899 Other long term (current) drug therapy: Secondary | ICD-10-CM

## 2010-11-08 DIAGNOSIS — Z13220 Encounter for screening for lipoid disorders: Secondary | ICD-10-CM

## 2010-11-08 DIAGNOSIS — K3189 Other diseases of stomach and duodenum: Secondary | ICD-10-CM

## 2010-11-08 DIAGNOSIS — E559 Vitamin D deficiency, unspecified: Secondary | ICD-10-CM

## 2010-11-08 LAB — BASIC METABOLIC PANEL
BUN: 12 mg/dL (ref 6–23)
CO2: 29 mEq/L (ref 19–32)
Chloride: 96 mEq/L (ref 96–112)
Glucose, Bld: 88 mg/dL (ref 70–99)
Potassium: 4.3 mEq/L (ref 3.5–5.3)

## 2010-11-08 LAB — LIPID PANEL
LDL Cholesterol: 94 mg/dL (ref 0–99)
Total CHOL/HDL Ratio: 3.9 Ratio
Triglycerides: 133 mg/dL (ref <150)

## 2010-11-08 MED ORDER — OMEPRAZOLE 40 MG PO CPDR: 40.0000 mg | DELAYED_RELEASE_CAPSULE | Freq: Every day | ORAL | Status: DC

## 2010-11-08 NOTE — Assessment & Plan Note (Signed)
Controlled, no change in medication  

## 2010-11-08 NOTE — Assessment & Plan Note (Signed)
Unchanged , cessation counseling done 

## 2010-11-08 NOTE — Assessment & Plan Note (Signed)
Urology is following the pt

## 2010-11-08 NOTE — Assessment & Plan Note (Signed)
New, lifestyle modofication, h pylori and 2 months omeprazole

## 2010-11-08 NOTE — Progress Notes (Signed)
  Subjective:    Patient ID: Craig Shaffer, male    DOB: 06-21-1960, 49 y.o.   MRN: [id number]  HPI 2 month h/o intermittent epigastric pain which at times is burning, lasts 5 to 10 minutes, sometimes causes him to break out in a sweat. Notes aggravated by by popcorn and soda.Approx every 3 to 4 weeks No lightheadedness, sometimes nauseated.OPccurs when sitting or lying down. S/p cholecystectomy. Drinks a lot f sodas at least 20 ounces x 3 per day No other concerns   Review of Systems Denies recent fever or chills. Denies sinus pressure, nasal congestion, ear pain or sore throat. Denies chest congestion, productive cough or wheezing. Denies chest pains, palpitations, paroxysmal nocturnal dyspnea, orthopnea and leg swelling Denies  nausea, vomiting,diarrhea or constipation.  Denies rectal bleeding or change in bowel movement. Denies dysuria, frequency, hesitancy or incontinence. Denies joint pain, swelling and limitation in mobility. Denies headaches, seizure, numbness, or tingling. Denies depression, anxiety or insomnia. Denies skin break down or rash. Chronic right lower ext weakness, uses a brace, ever since dx with neurologic disease over 10 yrs ago       Objective:   Physical Exam Patient alert and oriented and in no Cardiopulmonary distress.  HEENT: No facial asymmetry, EOMI, no sinus tenderness, TM's clear, Oropharynx pink and moist.  Neck supple no adenopathy.  Chest: Clear to auscultation bilaterally.  CVS: S1, S2 no murmurs, no S3.  ABD: Soft non tender. Bowel sounds normal.  Ext: No edema  MS: Adequate ROM spine, shoulders, hips and knees.  Skin: Intact, no ulcerations or rash noted.  Psych: Good eye contact, normal affect. Memory intact not anxious or depressed appearing.  CNS: CN 2-12 intact, reduced , and sensation in right lower ext        Assessment & Plan:

## 2010-11-08 NOTE — Patient Instructions (Addendum)
F/u in early December.  Pls start NEW one vit D3 tablet 800IU one daily, this is OTC, continue your multivitamin as before.   Please think about quitting smoking.  This is very important for your health.  Consider setting a quit date, then cutting back or switching brands to prepare to stop.  Also think of the money you will save every day by not smoking.  Quick Tips to Quit Smoking: Fix a date i.e. keep a date in mind from when you would not touch a tobacco product to smoke  Keep yourself busy and block your mind with work loads or reading books or watching movies in malls where smoking is not allowed  Vanish off the things which reminds you about smoking for example match box, or your favorite lighter, or the pipe you used for smoking, or your favorite jeans and shirt with which you used to enjoy smoking, or the club where you used to do smoking  Try to avoid certain people places and incidences where and with whom smoking is a common factor to add on  Praise yourself with some token gifts from the money you saved by stopping smoking  Anti Smoking teams are there to help you. Join their programs  Anti-smoking Gums are there in many medical shops. Try them to quit smoking   Side-effects of Smoking: Disease caused by smoking cigarettes are emphysema, bronchitis, heart failures  Premature death  Cancer is the major side effect of smoking  Heart attacks and strokes are the quick effects of smoking causing sudden death  Some smokers lives end up with limbs amputated  Breathing problem or fast breathing is another side effect of smoking  Due to more intakes of smokes, carbon mono-oxide goes into your brain and other muscles of the body which leads to swelling of the veins and blockage to the air passage to lungs  Carbon monoxide blocks blood vessels which leads to blockage in the flow of blood to different major body organs like heart lungs and thus leads to attacks and deaths  During pregnancy  smoking is very harmful and leads to premature birth of the infant, spontaneous abortions, low weight of the infant during birth  Fat depositions to narrow and blocked blood vessels causing heart attacks  In many cases cigarette smoking caused infertility in men    New medication for heartburn for 2 months only,  Labs today and just before follow up   pls call if abdominal pain persists, you will be referred to GI

## 2010-11-09 LAB — H. PYLORI ANTIBODY, IGG: H Pylori IgG: 1.22 {ISR} — ABNORMAL HIGH

## 2010-11-10 ENCOUNTER — Other Ambulatory Visit: Payer: Self-pay

## 2010-11-10 MED ORDER — AMOXICILL-CLARITHRO-LANSOPRAZ PO MISC: Freq: Two times a day (BID) | ORAL | Status: AC

## 2010-11-11 ENCOUNTER — Telehealth: Payer: Self-pay | Admitting: Family Medicine

## 2010-11-16 NOTE — Telephone Encounter (Signed)
Antibiotic prescription was split and cvered, prevacid not covered, ok'd omeprazole in its place

## 2010-12-13 ENCOUNTER — Other Ambulatory Visit: Payer: Self-pay | Admitting: Family Medicine

## 2011-01-24 ENCOUNTER — Ambulatory Visit (INDEPENDENT_AMBULATORY_CARE_PROVIDER_SITE_OTHER): Payer: Medicare Other | Admitting: Family Medicine

## 2011-01-24 ENCOUNTER — Encounter: Payer: Self-pay | Admitting: Family Medicine

## 2011-01-24 DIAGNOSIS — I10 Essential (primary) hypertension: Secondary | ICD-10-CM

## 2011-01-24 DIAGNOSIS — N390 Urinary tract infection, site not specified: Secondary | ICD-10-CM

## 2011-01-24 DIAGNOSIS — F17200 Nicotine dependence, unspecified, uncomplicated: Secondary | ICD-10-CM

## 2011-01-24 DIAGNOSIS — N3090 Cystitis, unspecified without hematuria: Secondary | ICD-10-CM | POA: Insufficient documentation

## 2011-01-24 MED ORDER — CIPROFLOXACIN HCL 500 MG PO TABS: 500.0000 mg | ORAL_TABLET | Freq: Two times a day (BID) | ORAL | Status: AC

## 2011-01-24 NOTE — Progress Notes (Signed)
  Subjective:    Patient ID: Craig Shaffer, male    DOB: 1961-03-29, 50 y.o.   MRN: [id number]  HPI 3 day h/u burning with urination, denies penile d/c , fever or chills, sees dr Rito Ehrlich re bPH and has had UTI in the past. Still smokes, no plan to quit. No other concerns   Review of Systems See HPI Denies recent fever or chills. Denies sinus pressure, nasal congestion, ear pain or sore throat. Denies chest congestion, productive cough or wheezing. Denies chest pains, palpitations and leg swelling Denies abdominal pain, nausea, vomiting,diarrhea or constipation.   . Denies joint pain and  swelling , chronic lower extremity limitation in mobility. Denies headaches, seizures, numbness, or tingling. Denies depression, anxiety or insomnia. Denies skin break down or rash.        Objective:   Physical Exam Patient alert and oriented and in no cardiopulmonary distress.  HEENT: No facial asymmetry, EOMI, no sinus tenderness,  oropharynx pink and moist.  Neck supple no adenopathy.  Chest: Clear to auscultation bilaterally.  CVS: S1, S2 no murmurs, no S3.  ABD: Soft non tender. Bowel sounds normal.  Ext: No edema  MS: Adequate ROM spine and  shoulders,  Skin: Intact, no ulcerations or rash noted.  Psych: Good eye contact, normal affect. Memory intact not anxious or depressed appearing.  CNS: CN 2-12 intact, power, tone and sensation normal throughout.        Assessment & Plan:

## 2011-01-24 NOTE — Patient Instructions (Signed)
F/u as before.  You will get medication sent in for presumed urinary tract infection, we will also send  The urine for further testing, if you do indeed have a uTI, you will be notified/called, and also your urologist Dr Rito Ehrlich   Please think about quitting smoking.  This is very important for your health.  Consider setting a quit date, then cutting back or switching brands to prepare to stop.  Also think of the money you will save every day by not smoking.  Quick Tips to Quit Smoking: Fix a date i.e. keep a date in mind from when you would not touch a tobacco product to smoke  Keep yourself busy and block your mind with work loads or reading books or watching movies in malls where smoking is not allowed  Vanish off the things which reminds you about smoking for example match box, or your favorite lighter, or the pipe you used for smoking, or your favorite jeans and shirt with which you used to enjoy smoking, or the club where you used to do smoking  Try to avoid certain people places and incidences where and with whom smoking is a common factor to add on  Praise yourself with some token gifts from the money you saved by stopping smoking  Anti Smoking teams are there to help you. Join their programs  Anti-smoking Gums are there in many medical shops. Try them to quit smoking   Side-effects of Smoking: Disease caused by smoking cigarettes are emphysema, bronchitis, heart failures  Premature death  Cancer is the major side effect of smoking  Heart attacks and strokes are the quick effects of smoking causing sudden death  Some smokers lives end up with limbs amputated  Breathing problem or fast breathing is another side effect of smoking  Due to more intakes of smokes, carbon mono-oxide goes into your brain and other muscles of the body which leads to swelling of the veins and blockage to the air passage to lungs  Carbon monoxide blocks blood vessels which leads to blockage in the flow of  blood to different major body organs like heart lungs and thus leads to attacks and deaths  During pregnancy smoking is very harmful and leads to premature birth of the infant, spontaneous abortions, low weight of the infant during birth  Fat depositions to narrow and blocked blood vessels causing heart attacks  In many cases cigarette smoking caused infertility in men

## 2011-01-27 LAB — URINE CULTURE: Colony Count: >=100000

## 2011-02-02 NOTE — Assessment & Plan Note (Signed)
Unchanged counseled to quit 

## 2011-02-02 NOTE — Assessment & Plan Note (Signed)
Controlled, no change in medication  

## 2011-02-02 NOTE — Assessment & Plan Note (Signed)
Antibiotics prescribd based on history and abn UA, has had uti in the past and is followed by urology for BPH

## 2011-05-16 ENCOUNTER — Other Ambulatory Visit: Payer: Self-pay | Admitting: Family Medicine

## 2011-05-17 ENCOUNTER — Encounter: Payer: Self-pay | Admitting: Family Medicine

## 2011-05-17 LAB — BASIC METABOLIC PANEL
CO2: 28 mEq/L (ref 19–32)
Calcium: 9 mg/dL (ref 8.4–10.5)
Chloride: 100 mEq/L (ref 96–112)
Potassium: 4.1 mEq/L (ref 3.5–5.3)
Sodium: 136 mEq/L (ref 135–145)

## 2011-05-19 ENCOUNTER — Other Ambulatory Visit: Payer: Self-pay | Admitting: Family Medicine

## 2011-05-19 ENCOUNTER — Ambulatory Visit (INDEPENDENT_AMBULATORY_CARE_PROVIDER_SITE_OTHER): Payer: Medicare Other | Admitting: Family Medicine

## 2011-05-19 ENCOUNTER — Other Ambulatory Visit: Payer: Self-pay

## 2011-05-19 ENCOUNTER — Encounter: Payer: Self-pay | Admitting: Family Medicine

## 2011-05-19 VITALS — BP 120/82 | HR 76 | Resp 16 | Ht 68.0 in | Wt 163.8 lb

## 2011-05-19 DIAGNOSIS — F17200 Nicotine dependence, unspecified, uncomplicated: Secondary | ICD-10-CM

## 2011-05-19 DIAGNOSIS — I10 Essential (primary) hypertension: Secondary | ICD-10-CM

## 2011-05-19 DIAGNOSIS — R5381 Other malaise: Secondary | ICD-10-CM

## 2011-05-19 DIAGNOSIS — Z79899 Other long term (current) drug therapy: Secondary | ICD-10-CM

## 2011-05-19 DIAGNOSIS — E559 Vitamin D deficiency, unspecified: Secondary | ICD-10-CM

## 2011-05-19 DIAGNOSIS — G378 Other specified demyelinating diseases of central nervous system: Secondary | ICD-10-CM

## 2011-05-19 DIAGNOSIS — R5383 Other fatigue: Secondary | ICD-10-CM

## 2011-05-19 LAB — LIPID PANEL
HDL: 44 mg/dL (ref >39)
LDL Cholesterol: 94 mg/dL (ref 0–99)
Total CHOL/HDL Ratio: 3.6 Ratio
Triglycerides: 109 mg/dL (ref <150)

## 2011-05-19 LAB — CBC WITH DIFFERENTIAL/PLATELET
Basophils Absolute: 0 10*3/uL (ref 0.0–0.1)
Eosinophils Relative: 4 % (ref 0–5)
HCT: 36.4 % — ABNORMAL LOW (ref 39.0–52.0)
Hemoglobin: 11.9 g/dL — ABNORMAL LOW (ref 13.0–17.0)
Lymphs Abs: 1.5 10*3/uL (ref 0.7–4.0)
Monocytes Absolute: 0.8 10*3/uL (ref 0.1–1.0)
Monocytes Relative: 13 % — ABNORMAL HIGH (ref 3–12)
Neutro Abs: 3.6 10*3/uL (ref 1.7–7.7)
Neutrophils Relative %: 59 % (ref 43–77)

## 2011-05-19 LAB — FOLATE: Folate: 5.9 ng/mL

## 2011-05-19 LAB — VITAMIN B12: Vitamin B-12: 743 pg/mL (ref 211–911)

## 2011-05-19 MED ORDER — OMEPRAZOLE 40 MG PO CPDR: DELAYED_RELEASE_CAPSULE | ORAL | Status: DC

## 2011-05-19 MED ORDER — VITAMIN D3 1.25 MG (50000 UT) PO CAPS: 50000.0000 [IU] | ORAL_CAPSULE | ORAL | Status: DC

## 2011-05-19 MED ORDER — HYDROCHLOROTHIAZIDE 12.5 MG PO CAPS: ORAL_CAPSULE | ORAL | Status: DC

## 2011-05-19 MED ORDER — DILTIAZEM HCL ER BEADS 360 MG PO CP24: ORAL_CAPSULE | ORAL | Status: DC

## 2011-05-19 MED ORDER — POTASSIUM CHLORIDE 10 MEQ PO TBCR: 10.0000 meq | EXTENDED_RELEASE_TABLET | Freq: Every day | ORAL | Status: DC

## 2011-05-19 MED ORDER — ASPIRIN 81 MG PO TABS: 81.0000 mg | ORAL_TABLET | Freq: Every day | ORAL | Status: DC

## 2011-05-19 NOTE — Assessment & Plan Note (Signed)
Controlled, no change in medication  

## 2011-05-19 NOTE — Assessment & Plan Note (Signed)
Stable for years, uses braces to legs, wants updated equipment if available which is less heavy

## 2011-05-19 NOTE — Patient Instructions (Signed)
F/u in 6 months.  You are being referred for screening colonscopy.  You are being referred to physical therapy for evaluation of new braces /other suopports for your legs if available.   Fasting lipid, cbc , TSH, vit D today  Flu vaccine is recommended  It is important that you exercise regularly at least 30 minutes 5 times a week. If you develop chest pain, have severe difficulty breathing, or feel very tired, stop exercising immediately and seek medical attention

## 2011-05-19 NOTE — Progress Notes (Signed)
  Subjective:    Patient ID: Craig Shaffer, male    DOB: [birth year], 50 y.o.   MRN: [id number]  HPI The PT is here for follow up and re-evaluation of chronic medical conditions, medication management and review of any available recent lab and radiology data.  Preventive health is updated, specifically  Cancer screening and Immunization.   Questions or concerns regarding consultations or procedures which the PT has had in the interim are  addressed. The PT denies any adverse reactions to current medications since the last visit.  Pt wants to know if he can have new lighter braces for his legs, has had the current ones over 10 years for neurologic disease leading to lower extremity weakness. States recently tried to donate blood and was told he was anemic, last colonoscopy approx 10 yrs ago per his report, will attempt to verify    Review of Systems See HPI Denies recent fever or chills. Denies sinus pressure, nasal congestion, ear pain or sore throat. Denies chest congestion, productive cough or wheezing. Denies chest pains, palpitations and leg swelling Denies abdominal pain, nausea, vomiting,diarrhea or constipation.   Denies dysuria, frequency, hesitancy or incontinence. Denies joint pain, swelling and limitation in mobility. Denies headaches, seizures, numbness, or tingling.Bilateral lower ext weakness needs leg braces Denies depression, anxiety or insomnia. Denies skin break down or rash.        Objective:   Physical Exam Patient alert and oriented and in no cardiopulmonary distress.  HEENT: No facial asymmetry, EOMI, no sinus tenderness,  oropharynx pink and moist.  Neck supple no adenopathy.  Chest: Clear to auscultation bilaterally.  CVS: S1, S2 no murmurs, no S3.  ABD: Soft non tender. Bowel sounds normal.  Ext: No edema  MS: Adequate ROM spine, shoulders, hips and knees.  Skin: Intact, no ulcerations or rash noted.  Psych: Good eye contact, normal affect.  Memory intact not anxious or depressed appearing.  CNS: CN 2-12 intact, tone and sensation normal throughout.Bilateral lower ext weakness        Assessment & Plan:

## 2011-05-19 NOTE — Assessment & Plan Note (Signed)
Followed by urology.   

## 2011-05-19 NOTE — Assessment & Plan Note (Signed)
Unchanged counseled to quit 

## 2011-05-20 LAB — VITAMIN D 25 HYDROXY (VIT D DEFICIENCY, FRACTURES): Vit D, 25-Hydroxy: 16 ng/mL — ABNORMAL LOW (ref 30–89)

## 2011-05-22 ENCOUNTER — Other Ambulatory Visit: Payer: Self-pay | Admitting: Family Medicine

## 2011-05-22 MED ORDER — VITAMIN D3 1.25 MG (50000 UT) PO CAPS: 50000.0000 [IU] | ORAL_CAPSULE | ORAL | Status: DC

## 2011-05-23 ENCOUNTER — Encounter: Payer: Self-pay | Admitting: Gastroenterology

## 2011-05-23 ENCOUNTER — Ambulatory Visit (INDEPENDENT_AMBULATORY_CARE_PROVIDER_SITE_OTHER): Payer: Medicare Other | Admitting: Gastroenterology

## 2011-05-23 VITALS — BP 119/78 | HR 80 | Temp 98.3°F | Ht 69.0 in | Wt 162.4 lb

## 2011-05-23 DIAGNOSIS — D509 Iron deficiency anemia, unspecified: Secondary | ICD-10-CM

## 2011-05-23 DIAGNOSIS — Z1211 Encounter for screening for malignant neoplasm of colon: Secondary | ICD-10-CM

## 2011-05-23 NOTE — Progress Notes (Addendum)
Referring Provider: Syliva Overman, MD Primary Care Physician:  Syliva Overman, MD, MD Primary Gastroenterologist:  Dr. Darrick Penna    Chief Complaint  Patient presents with  . Colonoscopy    HPI:   Craig Shaffer is a 50 year old male who presents today at the request of Dr. Lodema Hong for an updated colonoscopy. Incidentally, on review of his chart, I noted microcytic anemia in early December with Hgb 11.9. His iron has also been trending low, with recent value of 18. No ferritin on file currently. He also has a history of positive H.pylori serology in May of this year, s/p Prevpac treatment.  His last colonoscopy was in 2002 with Dr. Katrinka Blazing at [facility]. The path report is not available at this time, but one polyp was removed. He denies any symptoms such as abdominal pain, N/V, melena, hematochezia, or change in bowel habits. No wt loss or lack of appetite. Denies use of NSAIDs, aspirin powders. He takes Prilosec daily, with no reflux exacerbations. Denies any dysphagia or odynophagia.    Past Medical History  Diagnosis Date  . ED (erectile dysfunction)   . Nicotine addiction   . Hypertension   . Demyelinating disease of central nervous system   . Elevated PSA     Past Surgical History  Procedure Date  . Cholecystectomy 5/07  . Colonoscopy Jan 2002    Dr. Katrinka Blazing: sigmoid polyp benign     Current Outpatient Prescriptions  Medication Sig Dispense Refill  . aspirin 81 MG tablet Take 1 tablet (81 mg total) by mouth daily.  30 tablet  5  . Cholecalciferol (VITAMIN D3) 50000 UNITS CAPS Take 50,000 Units by mouth once a week.  12 capsule  1  . diltiazem (TAZTIA XT) 360 MG 24 hr capsule TAKE 1 CAPSULE BY MOUTH DAILY  30 capsule  5  . hydrochlorothiazide (MICROZIDE) 12.5 MG capsule TAKE 1 CAPSULE BY MOUTH ONCE DAILY  30 capsule  5  . multivitamin (THERAGRAN) per tablet Take 1 tablet by mouth daily.        Marland Kitchen omeprazole (PRILOSEC) 40 MG capsule TAKE 1 CAPSULE BY MOUTH EVERY DAY  90 capsule  1    . potassium chloride (KLOR-CON) 10 MEQ CR tablet Take 1 tablet (10 mEq total) by mouth daily.  30 tablet  5    Allergies as of 05/23/2011  . (No Known Allergies)    Family History  Problem Relation Age of Onset  . Colon cancer Neg Hx     History   Social History  . Marital Status: Single    Spouse Name: N/A    Number of Children: 0  . Years of Education: N/A   Occupational History  .     Social History Main Topics  . Smoking status: Current Everyday Smoker -- 0.3 packs/day    Types: Cigarettes  . Smokeless tobacco: Not on file  . Alcohol Use: No  . Drug Use: No  . Sexually Active: Not on file   Other Topics Concern  . Not on file   Social History Narrative  . No narrative on file    Review of Systems: Gen: Denies any fever, chills, loss of appetite, fatigue, weight loss. CV: Denies chest pain, heart palpitations, syncope, peripheral edema. Resp: Denies shortness of breath with rest, cough, wheezing GI: Denies dysphagia or odynophagia. Denies hematemesis, fecal incontinence, or jaundice.  GU : Denies urinary burning, urinary frequency, urinary incontinence.  MS: Denies joint pain, muscle weakness, cramps, limited movement Derm: Denies rash, itching, dry skin  Psych: Denies depression, anxiety, confusion or memory loss  Heme: Denies bruising, bleeding, and enlarged lymph nodes.  Physical Exam: BP 119/78  Pulse 80  Temp(Src) 98.3 F (36.8 C) (Temporal)  Ht 5\' 9"  (1.753 m)  Wt 162 lb 6.4 oz (73.664 kg)  BMI 23.98 kg/m2 General:   Alert and oriented. Well-developed, well-nourished, pleasant and cooperative. Head:  Normocephalic and atraumatic. Eyes:  Conjunctiva pink, sclera clear, no icterus.   Conjunctiva pink. Ears:  Normal auditory acuity. Nose:  No deformity, discharge,  or lesions. Mouth:  No deformity or lesions, mucosa pink and moist.  Neck:  Supple, without mass or thyromegaly. Lungs:  Clear to auscultation bilaterally, without wheezing, rales, or  rhonchi.  Heart:  S1, S2 present without murmurs noted.  Abdomen:  +BS, soft, non-tender and non-distended. Without mass or HSM. No rebound or guarding. No hernias noted. Rectal:  Deferred  Msk:  Symmetrical without gross deformities. Normal posture. Extremities:  Without clubbing or edema. Neurologic:  Alert and  oriented x4;  grossly normal neurologically. Skin:  Intact, warm and dry without significant lesions or rashes Cervical Nodes:  No significant cervical adenopathy. Psych:  Alert and cooperative. Normal mood and affect.

## 2011-05-23 NOTE — Patient Instructions (Signed)
We have set you up for a look at your upper and lower GI tract with an endoscopy and colonoscopy. This will be done with Dr. Darrick Penna.  Further recommendations to follow after these procedures.   Merry Christmas!

## 2011-05-24 ENCOUNTER — Ambulatory Visit (HOSPITAL_COMMUNITY)
Admission: RE | Admit: 2011-05-24 | Discharge: 2011-05-24 | Disposition: A | Discharge status: Home / Self Care | Payer: Medicare Other | Source: Ambulatory Visit | Attending: Family Medicine | Admitting: Family Medicine

## 2011-05-24 ENCOUNTER — Encounter: Payer: Self-pay | Admitting: Gastroenterology

## 2011-05-24 DIAGNOSIS — D509 Iron deficiency anemia, unspecified: Secondary | ICD-10-CM | POA: Insufficient documentation

## 2011-05-24 DIAGNOSIS — IMO0001 Reserved for inherently not codable concepts without codable children: Secondary | ICD-10-CM | POA: Insufficient documentation

## 2011-05-24 DIAGNOSIS — M6281 Muscle weakness (generalized): Secondary | ICD-10-CM | POA: Insufficient documentation

## 2011-05-24 DIAGNOSIS — R262 Difficulty in walking, not elsewhere classified: Secondary | ICD-10-CM | POA: Insufficient documentation

## 2011-05-24 NOTE — Assessment & Plan Note (Signed)
See IDA.

## 2011-05-24 NOTE — Assessment & Plan Note (Signed)
50 year old male referred by Dr. Lodema Hong for updated colonoscopy. Upon review of chart, apparently had positive H.pylori serology in May 2012 and has been treated. He is asymptomatic at this time. However, appears to have microcytic anemia on labs as well as low iron. No ferritin available at this time. Question IDA, in which case he needs both a colonoscopy and EGD. We will obtain a ferritin and ifobt for our files as well.  Proceed with colonoscopy and EGD with Dr. Darrick Penna in the near future. The risks, benefits, and alternatives have been discussed in detail with the patient. They state understanding and desire to proceed.  Ferritin ifobt

## 2011-05-24 NOTE — Progress Notes (Signed)
Physical Therapy Evaluation  Patient Details  Name: Craig Shaffer MRN: [id number] Date of Birth: [birth year]  Today's Date: 05/24/2011 Time: 4098-1191 Time Calculation (min): 18 min Visit#: 1  of 1   Re-eval:   Assessment Diagnosis: Demyelinating DZ  Prior Therapy: 1999  Past Medical History:  Past Medical History  Diagnosis Date  . ED (erectile dysfunction)   . Nicotine addiction   . Hypertension   . Demyelinating disease of central nervous system   . Elevated PSA    Past Surgical History:  Past Surgical History  Procedure Date  . Cholecystectomy 5/07    Subjective Symptoms/Limitations Symptoms: Craig Shaffer states that he was diagnosed with a demyelinating dz over 10 yrs ago and was wheelchair bound.  After extensive therapy he progressed to a point where he is able to walk with AFO's.  The patient has had his AFO since 1999 and states for the past several years the AFO's have been becoming increasingly uncomfortable and he actually believes that it is starting to be more difficult walking with them.    How long can you sit comfortably?: no Problem How long can you stand comfortably?: no problem How long can you walk comfortably?: 15 to 20 minutes before the AFO's start rubbing and being painful Pain Assessment Currently in Pain?: No/denies    Prior Functioning  Home Living Lives With: Family  Assessment RLE Strength Right Hip Flexion: 5/5 Right Hip Extension: 5/5 Right Hip ABduction: 5/5 Right Hip ADduction: 5/5 Right Knee Flexion: 5/5 Right Knee Extension: 5/5 Right Ankle Dorsiflexion: 1/5 Right Ankle Plantar Flexion: 1/5 Right Ankle Inversion: 1/5 Right Ankle Eversion: 1/5 LLE Strength Left Hip Flexion: 5/5 Left Hip Extension: 5/5 Left Hip ABduction: 5/5 Left Hip ADduction: 5/5 Left Knee Flexion: 5/5 Left Knee Extension: 5/5 Left Ankle Dorsiflexion: 1/5 Left Ankle Plantar Flexion: 1/5 Left Ankle Inversion: 1/5 Left Ankle Eversion: 1/5      Physical Therapy Assessment and Plan PT Assessment and Plan Clinical Impression Statement: Pt with good LE strength except for ankle's.   PT Plan: No skilled therapy needed pt needs referal to [facility] which was made.   Pt to go to appointment on Dec. 18th.    Goals PT Short Term Goals PT Short Term Goal 1: Referral to Craig Shaffer for custom made B AFO's PT Short Term Goal 1 - Progress: Met  Problem List Patient Active Problem List  Diagnoses  . ERECTILE DYSFUNCTION  . NICOTINE ADDICTION  . OTHER DEMYELINATING DISEASES OF CNTRL NERV SYS  . HYPERTENSION  . UNSPECIFIED PERIPHERAL VASCULAR DISEASE  . ELEVATED PROSTATE SPECIFIC ANTIGEN  . Dyspepsia  . Cystitis    General Behavior During Session: Craig Shaffer for tasks performed Cognition: Craig Shaffer for tasks performed   Craig Shaffer,Craig Shaffer 05/24/2011, 10:10 AM  Physician Documentation Your signature is required to indicate approval of the treatment plan as stated above.  Please sign and either send electronically or make a copy of this report for your files and return this physician signed original.   Please mark one 1.__approve of plan  2. ___approve of plan with the following conditions.   ______________________________                                                          _____________________ Physician Signature  Date  

## 2011-05-25 ENCOUNTER — Encounter: Payer: Self-pay | Admitting: Gastroenterology

## 2011-05-25 NOTE — Progress Notes (Signed)
Cc to PCP 

## 2011-05-25 NOTE — Progress Notes (Signed)
Called pt to come by and get iFOBT kit and Lab order for Ferritin. He said he will be by today.

## 2011-05-26 LAB — FERRITIN: Ferritin: 14 ng/mL — ABNORMAL LOW (ref 22–322)

## 2011-05-29 NOTE — Progress Notes (Signed)
Quick Note:  Low ferritin. IDA.  Proceed with TCS and EGD as planned. ______

## 2011-05-29 NOTE — Progress Notes (Signed)
Quick Note:  LMOM to call. ______ 

## 2011-05-30 ENCOUNTER — Telehealth: Payer: Self-pay

## 2011-05-30 ENCOUNTER — Encounter: Payer: Self-pay | Admitting: Gastroenterology

## 2011-05-30 NOTE — Telephone Encounter (Signed)
Thanks

## 2011-05-30 NOTE — Telephone Encounter (Signed)
Pt returned phone call for results. Informed iron is low. Informed he is to proceed with procedures as planned. He said he just started taking iron. I told him to make sure he does not take it 7 days before procedures.

## 2011-05-30 NOTE — Telephone Encounter (Signed)
Pt already has instructions- I will mail him new ones

## 2011-05-31 ENCOUNTER — Ambulatory Visit (INDEPENDENT_AMBULATORY_CARE_PROVIDER_SITE_OTHER): Payer: Medicare Other | Admitting: Gastroenterology

## 2011-05-31 DIAGNOSIS — D509 Iron deficiency anemia, unspecified: Secondary | ICD-10-CM

## 2011-06-01 DIAGNOSIS — D509 Iron deficiency anemia, unspecified: Secondary | ICD-10-CM

## 2011-06-01 LAB — IFOBT (OCCULT BLOOD): IFOBT: NEGATIVE

## 2011-06-01 NOTE — Progress Notes (Signed)
Quick Note:  Negative. Continue with TCS/EGD as planned secondary to IDA. ______

## 2011-06-14 NOTE — Progress Notes (Signed)
REVIEWED.  NEED POLYP PATH NOTED IN CHART

## 2011-06-16 ENCOUNTER — Encounter (HOSPITAL_COMMUNITY): Payer: Self-pay | Admitting: Pharmacy Technician

## 2011-06-22 MED ORDER — SODIUM CHLORIDE 0.45 % IV SOLN: Freq: Once | INTRAVENOUS | Status: DC

## 2011-06-23 ENCOUNTER — Other Ambulatory Visit: Payer: Self-pay | Admitting: Gastroenterology

## 2011-06-23 ENCOUNTER — Encounter (HOSPITAL_COMMUNITY): Payer: Self-pay | Admitting: *Deleted

## 2011-06-23 ENCOUNTER — Ambulatory Visit (HOSPITAL_COMMUNITY)
Admission: RE | Admit: 2011-06-23 | Discharge: 2011-06-23 | Disposition: A | Discharge status: Home / Self Care | Payer: Medicare Other | Source: Ambulatory Visit | Attending: Gastroenterology | Admitting: Gastroenterology

## 2011-06-23 ENCOUNTER — Encounter (HOSPITAL_COMMUNITY)
Admission: RE | Disposition: A | Discharge status: Home / Self Care | Payer: Self-pay | Source: Ambulatory Visit | Attending: Gastroenterology

## 2011-06-23 DIAGNOSIS — Z7982 Long term (current) use of aspirin: Secondary | ICD-10-CM | POA: Diagnosis not present

## 2011-06-23 DIAGNOSIS — K2970 Gastritis, unspecified, without bleeding: Secondary | ICD-10-CM | POA: Diagnosis not present

## 2011-06-23 DIAGNOSIS — K2940 Chronic atrophic gastritis without bleeding: Secondary | ICD-10-CM | POA: Diagnosis not present

## 2011-06-23 DIAGNOSIS — K648 Other hemorrhoids: Secondary | ICD-10-CM | POA: Diagnosis not present

## 2011-06-23 DIAGNOSIS — I10 Essential (primary) hypertension: Secondary | ICD-10-CM | POA: Insufficient documentation

## 2011-06-23 DIAGNOSIS — Z79899 Other long term (current) drug therapy: Secondary | ICD-10-CM | POA: Insufficient documentation

## 2011-06-23 DIAGNOSIS — D509 Iron deficiency anemia, unspecified: Secondary | ICD-10-CM

## 2011-06-23 DIAGNOSIS — D128 Benign neoplasm of rectum: Secondary | ICD-10-CM | POA: Insufficient documentation

## 2011-06-23 DIAGNOSIS — K2990 Gastroduodenitis, unspecified, without bleeding: Secondary | ICD-10-CM

## 2011-06-23 DIAGNOSIS — D129 Benign neoplasm of anus and anal canal: Secondary | ICD-10-CM | POA: Insufficient documentation

## 2011-06-23 DIAGNOSIS — K621 Rectal polyp: Secondary | ICD-10-CM | POA: Diagnosis not present

## 2011-06-23 DIAGNOSIS — K620 Anal polyp: Secondary | ICD-10-CM | POA: Diagnosis not present

## 2011-06-23 SURGERY — COLONOSCOPY WITH ESOPHAGOGASTRODUODENOSCOPY (EGD)
Anesthesia: Moderate Sedation

## 2011-06-23 MED ORDER — STERILE WATER FOR IRRIGATION IR SOLN
Status: DC | PRN
  Administered 2011-06-23: 09:00:00

## 2011-06-23 MED ORDER — MEPERIDINE HCL 100 MG/ML IJ SOLN
INTRAMUSCULAR | Status: DC | PRN
  Administered 2011-06-23: 25 mg via INTRAVENOUS
  Administered 2011-06-23: 50 mg via INTRAVENOUS

## 2011-06-23 MED ORDER — MIDAZOLAM HCL 5 MG/5ML IJ SOLN
INTRAMUSCULAR | Status: AC
  Filled 2011-06-23: qty 5

## 2011-06-23 MED ORDER — MEPERIDINE HCL 100 MG/ML IJ SOLN
INTRAMUSCULAR | Status: AC
  Filled 2011-06-23: qty 1

## 2011-06-23 MED ORDER — MIDAZOLAM HCL 5 MG/5ML IJ SOLN
INTRAMUSCULAR | Status: DC | PRN
  Administered 2011-06-23 (×2): 2 mg via INTRAVENOUS

## 2011-06-23 MED ORDER — BUTAMBEN-TETRACAINE-BENZOCAINE 2-2-14 % EX AERO
INHALATION_SPRAY | CUTANEOUS | Status: DC | PRN
  Administered 2011-06-23: 2 via TOPICAL

## 2011-06-23 NOTE — Op Note (Signed)
[facility] [address] [place], [place]  [address]  COLONOSCOPY PROCEDURE REPORT  PATIENT:  Matan, Steen  MR#:  [id number] BIRTHDATE:  10/23/1960, 50 yrs. old  GENDER:  male  ENDOSCOPIST:  Jonette Eva, MD REF. BY:  Syliva Overman, M.D. ASSISTANT:  PROCEDURE DATE:  06/23/2011 PROCEDURE:  ILEOColonoscopy with biopsy  INDICATIONS:  ferritin 14 hb 11/9 no BRBPR OR MELENA DEC 2012 iFOBT NEG  MEDICATIONS:   Demerol 75 mg IV, Versed 4 mg IV  DESCRIPTION OF PROCEDURE:    Physical exam was performed. Informed consent was obtained from the patient after explaining the benefits, risks, and alternatives to procedure.  The patient was connected to monitor and placed in left lateral position. Continuous oxygen was provided by nasal cannula and IV medicine administered through an indwelling cannula.  After administration of sedation and rectal exam, the patient's rectum was intubated and the EC-3890Li ([id number]) colonoscope was advanced under direct visualization to the ILEUM.  The scope was removed slowly by carefully examining the color, texture, anatomy, and integrity mucosa on the way out.  The patient was recovered in endoscopy and discharged home in satisfactory condition. <<PROCEDUREIMAGES>>  FINDINGS:  A 4 MM sessile polyp was found in the rectum & REMOVED VIA COLD FORCEPS.  MODRATE Internal Hemorrhoids were found.  PREP QUALITY: EXCELLENT CECAL W/D TIME:     10 minutes  COMPLICATIONS:    None  ENDOSCOPIC IMPRESSION: 1) Sessile polyp in the rectum 2) Internal hemorrhoids  RECOMMENDATIONS: TCS IN 10 YEARS HIGH FIBER DIET AWAIT BIOPSIES PROCEED TO EGD  REPEAT EXAM:  No  ______________________________ Jonette Eva, MD  CC:  Syliva Overman, M.D.  n. eSIGNED:   Sandi Fields at 06/23/2011 10:33 AM  Aniceto Boss, [phone]

## 2011-06-23 NOTE — Op Note (Signed)
[facility] [address] [place], [place]  [address]  ENDOSCOPY PROCEDURE REPORT  PATIENT:  Craig Shaffer, Craig Shaffer  MR#:  [id number] BIRTHDATE:  [birth year], 50 yrs. old  GENDER:  male  ENDOSCOPIST:  Jonette Eva, MD Referred by:  Syliva Overman, M.D.  PROCEDURE DATE:  06/23/2011 PROCEDURE:  EGD with biopsy, [address] ASA CLASS: INDICATIONS:  IRON DEFICIENCY ANEMIA  MEDICATIONS:  TCS + None TOPICAL ANESTHETIC:  Cetacaine Spray  DESCRIPTION OF PROCEDURE:     Physical exam was performed. Informed consent was obtained from the patient after explaining the benefits, risks, and alternatives to the procedure.  The patient was connected to the monitor and placed in the left lateral position.  Continuous oxygen was provided by nasal cannula and IV medicine administered through an indwelling cannula.  After administration of sedation, the patient's esophagus was intubated and the EC-3890Li ([id number]) and EG-[id number] ([id number]) endoscope was advanced under direct visualization to the second portion of the duodenum.  The scope was removed slowly by carefully examining the color, texture, anatomy, and integrity of the mucosa on the way out.  The patient was recovered in endoscopy and discharged home in satisfactory condition. <<PROCEDUREIMAGES>>  Mild gastritis was found & BIOPSIED VIA COLD FORCEPS. Normal esophagus. Normal appearing duodenum. Biopsied via cold forceps to evaluate for celiac sprue.  COMPLICATIONS:    None  ENDOSCOPIC IMPRESSION: 1) Mild gastritis  RECOMMENDATIONS: CONTINUE OMEPRAZOLE & IRON. FOLLOW UP IN 3 MOS. AWAIT BIOPSIES.  REPEAT EXAM:  No  ______________________________ Jonette Eva, MD  CC:  n. eSIGNED:   Muhanad Torosyan at 06/23/2011 11:11 AM  Aniceto Boss, [phone]

## 2011-06-23 NOTE — H&P (Signed)
  Primary Care Physician:  Syliva Overman, MD, MD Primary Gastroenterologist:  Dr. Darrick Penna  Pre-Procedure History & Physical: HPI:  Craig Shaffer is a 51 y.o. male here for ANEMIA  Past Medical History  Diagnosis Date  . ED (erectile dysfunction)   . Nicotine addiction   . Hypertension   . Demyelinating disease of central nervous system   . Elevated PSA     Past Surgical History  Procedure Date  . Cholecystectomy 5/07  . Colonoscopy Jan 2002    Dr. Katrinka Blazing: sigmoid polyp benign     Prior to Admission medications   Medication Sig Start Date End Date Taking? Authorizing Provider  ferrous gluconate (FERGON) 324 MG tablet Take 324 mg by mouth daily with breakfast.   Yes Historical Provider, MD  aspirin 81 MG tablet Take 1 tablet (81 mg total) by mouth daily. 05/19/11   Syliva Overman, MD  Cholecalciferol (VITAMIN D3) 50000 UNITS CAPS Take 50,000 Units by mouth once a week. 05/22/11   Syliva Overman, MD  diltiazem (TAZTIA XT) 360 MG 24 hr capsule TAKE 1 CAPSULE BY MOUTH DAILY 05/19/11   Syliva Overman, MD  hydrochlorothiazide (MICROZIDE) 12.5 MG capsule TAKE 1 CAPSULE BY MOUTH ONCE DAILY 05/19/11   Syliva Overman, MD  multivitamin [facility]) per tablet Take 1 tablet by mouth daily.      Historical Provider, MD  omeprazole (PRILOSEC) 40 MG capsule TAKE 1 CAPSULE BY MOUTH EVERY DAY 05/19/11   Syliva Overman, MD  potassium chloride (KLOR-CON) 10 MEQ CR tablet Take 1 tablet (10 mEq total) by mouth daily. 05/19/11   Syliva Overman, MD    Allergies as of 05/23/2011  . (No Known Allergies)    Family History  Problem Relation Age of Onset  . Colon cancer Neg Hx     History   Social History  . Marital Status: Single    Spouse Name: N/A    Number of Children: 0  . Years of Education: N/A   Occupational History  .     Social History Main Topics  . Smoking status: Current Everyday Smoker -- 0.3 packs/day    Types: Cigarettes  . Smokeless tobacco: Not on file  .  Alcohol Use: No  . Drug Use: No  . Sexually Active: Not on file   Other Topics Concern  . Not on file   Social History Narrative  . No narrative on file    Review of Systems: See HPI, otherwise negative ROS   Physical Exam: BP 128/92  Pulse 89  Temp(Src) 98.6 F (37 C) (Oral)  Resp 16  Ht 5\' 9"  (1.753 m)  Wt 162 lb (73.483 kg)  BMI 23.92 kg/m2  SpO2 98% General:   Alert,  pleasant and cooperative in NAD Head:  Normocephalic and atraumatic. Neck:  Supple;  Lungs:  Clear throughout to auscultation.    Heart:  Regular rate and rhythm. Abdomen:  Soft, nontender and nondistended. Normal bowel sounds, without guarding, and without rebound.   Neurologic:  Alert and  oriented x4;  grossly normal neurologically.  Impression/Plan:     Iron deficiency anemia  PLAN: TCS/?EGD TODAY

## 2011-06-28 ENCOUNTER — Telehealth: Payer: Self-pay | Admitting: Gastroenterology

## 2011-06-28 NOTE — Telephone Encounter (Signed)
Please call pt. He had HYPERPLASTIC POLYPS removed from her colon. TCS in 10 years. High fiber diet.  

## 2011-06-28 NOTE — Telephone Encounter (Signed)
Results Cc to PCP  

## 2011-06-28 NOTE — Telephone Encounter (Signed)
Pt informed

## 2011-07-04 NOTE — Telephone Encounter (Signed)
Reminder in epic to have tcs in 10 years 

## 2011-08-10 DIAGNOSIS — R972 Elevated prostate specific antigen [PSA]: Secondary | ICD-10-CM | POA: Diagnosis not present

## 2011-08-17 DIAGNOSIS — N390 Urinary tract infection, site not specified: Secondary | ICD-10-CM | POA: Diagnosis not present

## 2011-08-17 DIAGNOSIS — R972 Elevated prostate specific antigen [PSA]: Secondary | ICD-10-CM | POA: Diagnosis not present

## 2011-09-21 ENCOUNTER — Ambulatory Visit: Payer: Medicare Other | Admitting: Gastroenterology

## 2011-09-26 ENCOUNTER — Encounter: Payer: Self-pay | Admitting: Gastroenterology

## 2011-09-27 ENCOUNTER — Ambulatory Visit (INDEPENDENT_AMBULATORY_CARE_PROVIDER_SITE_OTHER): Payer: Medicare Other | Admitting: Gastroenterology

## 2011-09-27 ENCOUNTER — Encounter: Payer: Self-pay | Admitting: Gastroenterology

## 2011-09-27 VITALS — BP 131/81 | HR 75 | Temp 97.7°F | Ht 69.0 in | Wt 161.0 lb

## 2011-09-27 DIAGNOSIS — D509 Iron deficiency anemia, unspecified: Secondary | ICD-10-CM | POA: Diagnosis not present

## 2011-09-27 NOTE — Assessment & Plan Note (Signed)
TCS/EGD DID NOT REVEAL A SOURCE FOR LOW IRON/HB/MCV.  NEEDS CAPSULE ENDOSCOPY TO COMPLETE EVALUATION FOR OBSCURE GIB/FEDA. HOLD IRON FOR 7 DAYS FOR THE STUDY. DISCUSSED PROCEDURE & INDICATION WITH PT. OPV IN 6 MOS.

## 2011-09-27 NOTE — Patient Instructions (Signed)
HOLD IRON FOR 7 DAYS. CAPSULE ENDOSCOPY NEXT WEEK. FOLLOW UP IN 6 MOS.

## 2011-09-27 NOTE — Progress Notes (Signed)
  Subjective:    Patient ID: Craig Shaffer, male    DOB: 03-29-1961, 51 y.o.   MRN: [id number]  PCP: [facility]  HPI The patient denies abdominal or flank pain, anorexia, nausea or vomiting, dysphagia, change in bowel habits or black or bloody stools or weight loss. NO QUESTIONS OR CONCERNS. NO DYSPHAGIA. USES ASA DAILY-NO OTHER NSAIDS.  Past Medical History  Diagnosis Date  . ED (erectile dysfunction)   . Nicotine addiction   . Hypertension   . Demyelinating disease of central nervous system   . Elevated PSA   . Anemia 2012 MCV 76.2 HB 12.3 CR 1.0-1.24    FERRITIN 14     Past Surgical History  Procedure Date  . Cholecystectomy 5/07  . Upper gastrointestinal endoscopy JAN 2013 ANEMIA    GASTRITIS, NL DUO Bx  . Colonoscopy 2002 [facility] POLYP  . Colonoscopy JAN 2013 [facility] ANEMIA    H POLYP, IH    A[facility]  Current Outpatient Prescriptions  Medication Sig Dispense Refill  . aspirin 81 MG tablet Take 1 tablet (81 mg total) by mouth daily.    . Cholecalciferol (VITAMIN D3) 50000 UNITS CAPS Take 50,000 Units by mouth once a week.    . diltiazem (TAZTIA XT) 360 MG 24 hr capsule TAKE 1 CAPSULE BY MOUTH DAILY    . ferrous gluconate (FERGON) 324 MG tablet Take 324 mg by mouth daily with breakfast.    . hydrochlorothiazide (MICROZIDE) 12.5 MG capsule TAKE 1 CAPSULE BY MOUTH ONCE DAILY    . multivitamin (THERAGRAN) per tablet Take 1 tablet by mouth daily.      Marland Kitchen omeprazole (PRILOSEC) 40 MG capsule TAKE 1 CAPSULE BY MOUTH EVERY DAY    . potassium chloride (KLOR-CON) 10 MEQ CR tablet Take 1 tablet (10 mEq total) by mouth daily.        Review of Systems     Objective:   Physical Exam  Vitals reviewed. Constitutional: He is oriented to person, place, and time. He appears well-developed and well-nourished. No distress.  HENT:  Head: Normocephalic and atraumatic.  Mouth/Throat: Oropharynx is clear and moist. No oropharyngeal exudate.  Eyes: Pupils are equal, round, and  reactive to light.  Neck: Normal range of motion. Neck supple.  Cardiovascular: Normal rate, regular rhythm and normal heart sounds.   Pulmonary/Chest: Effort normal and breath sounds normal. No respiratory distress.  Abdominal: Soft. Bowel sounds are normal. He exhibits no distension. There is no tenderness.  Musculoskeletal: Normal range of motion. He exhibits no edema.  Lymphadenopathy:    He has no cervical adenopathy.  Neurological: He is alert and oriented to person, place, and time.       NO FOCAL DEFICITS   Psychiatric: He has a normal mood and affect.          Assessment & Plan:

## 2011-09-27 NOTE — Progress Notes (Signed)
Faxed to PCP

## 2011-09-27 NOTE — Progress Notes (Signed)
Addended by: Cherene Julian D on: 09/27/2011 10:21 AM   Modules accepted: Orders

## 2011-09-27 NOTE — Progress Notes (Signed)
Reminder in epic to follow up in 6 months °

## 2011-10-05 ENCOUNTER — Encounter (HOSPITAL_COMMUNITY): Payer: Self-pay | Admitting: *Deleted

## 2011-10-05 ENCOUNTER — Encounter (HOSPITAL_COMMUNITY)
Admission: RE | Disposition: A | Discharge status: Home / Self Care | Payer: Self-pay | Source: Ambulatory Visit | Attending: Gastroenterology

## 2011-10-05 ENCOUNTER — Ambulatory Visit (HOSPITAL_COMMUNITY)
Admission: RE | Admit: 2011-10-05 | Discharge: 2011-10-05 | Disposition: A | Discharge status: Home / Self Care | Payer: Medicare Other | Source: Ambulatory Visit | Attending: Gastroenterology | Admitting: Gastroenterology

## 2011-10-05 DIAGNOSIS — D509 Iron deficiency anemia, unspecified: Secondary | ICD-10-CM | POA: Insufficient documentation

## 2011-10-05 HISTORY — PX: GIVENS CAPSULE STUDY: SHX5432

## 2011-10-05 SURGERY — IMAGING PROCEDURE, GI TRACT, INTRALUMINAL, VIA CAPSULE

## 2011-10-06 ENCOUNTER — Encounter (HOSPITAL_COMMUNITY): Payer: Self-pay | Admitting: Gastroenterology

## 2011-11-13 ENCOUNTER — Other Ambulatory Visit: Payer: Self-pay | Admitting: Family Medicine

## 2011-11-17 ENCOUNTER — Encounter: Payer: Self-pay | Admitting: Family Medicine

## 2011-11-17 ENCOUNTER — Ambulatory Visit (INDEPENDENT_AMBULATORY_CARE_PROVIDER_SITE_OTHER): Payer: Medicare Other | Admitting: Family Medicine

## 2011-11-17 ENCOUNTER — Other Ambulatory Visit: Payer: Self-pay

## 2011-11-17 VITALS — BP 122/82 | HR 76 | Resp 16 | Ht 69.0 in | Wt 161.1 lb

## 2011-11-17 DIAGNOSIS — R5383 Other fatigue: Secondary | ICD-10-CM | POA: Diagnosis not present

## 2011-11-17 DIAGNOSIS — R1013 Epigastric pain: Secondary | ICD-10-CM

## 2011-11-17 DIAGNOSIS — M899 Disorder of bone, unspecified: Secondary | ICD-10-CM

## 2011-11-17 DIAGNOSIS — Z139 Encounter for screening, unspecified: Secondary | ICD-10-CM | POA: Diagnosis not present

## 2011-11-17 DIAGNOSIS — M949 Disorder of cartilage, unspecified: Secondary | ICD-10-CM

## 2011-11-17 DIAGNOSIS — D509 Iron deficiency anemia, unspecified: Secondary | ICD-10-CM

## 2011-11-17 DIAGNOSIS — I10 Essential (primary) hypertension: Secondary | ICD-10-CM | POA: Diagnosis not present

## 2011-11-17 DIAGNOSIS — K3189 Other diseases of stomach and duodenum: Secondary | ICD-10-CM

## 2011-11-17 DIAGNOSIS — F17200 Nicotine dependence, unspecified, uncomplicated: Secondary | ICD-10-CM

## 2011-11-17 DIAGNOSIS — F528 Other sexual dysfunction not due to a substance or known physiological condition: Secondary | ICD-10-CM

## 2011-11-17 DIAGNOSIS — R5381 Other malaise: Secondary | ICD-10-CM

## 2011-11-17 MED ORDER — DILTIAZEM HCL ER BEADS 360 MG PO CP24: ORAL_CAPSULE | ORAL | Status: DC

## 2011-11-17 NOTE — Assessment & Plan Note (Signed)
Pt to stop caffeine, drinks sodas, and try to discontinue omeprazole

## 2011-11-17 NOTE — Progress Notes (Signed)
  Subjective:    Patient ID: Craig Shaffer, male    DOB: 1960/08/07, 50 y.o.   MRN: [id number]  HPI The PT is here for follow up and re-evaluation of chronic medical conditions, medication management and review of any available recent lab and radiology data.  Preventive health is updated, specifically  Cancer screening and Immunization.   Questions or concerns regarding consultations or procedures which the PT has had in the interim are  addressed. The PT denies any adverse reactions to current medications since the last visit.  There are no new concerns.  There are no specific complaints       Review of Systems See HPI Denies recent fever or chills. Denies sinus pressure, nasal congestion, ear pain or sore throat. Denies chest congestion, productive cough or wheezing. Denies chest pains, palpitations and leg swelling Denies abdominal pain, nausea, vomiting,diarrhea or constipation.   Denies dysuria, frequency, hesitancy or incontinence.  Denies headaches, seizures, numbness, or tingling. Denies depression, anxiety or insomnia. Denies skin break down or rash.        Objective:   Physical Exam Patient alert and oriented and in no cardiopulmonary distress.  HEENT: No facial asymmetry, EOMI, no sinus tenderness,  oropharynx pink and moist.  Neck supple no adenopathy.  Chest: Clear to auscultation bilaterally.Decreased air entry throughout  CVS: S1, S2 no murmurs, no S3.  ABD: Soft non tender. Bowel sounds normal.  Ext: No edema  MS: Adequate ROM spine  Skin: Intact, no ulcerations or rash noted.  Psych: Good eye contact, normal affect. Memory intact not anxious or depressed appearing.  CNS: CN 2-12 intact, power, tone and sensation normal throughout.        Assessment & Plan:

## 2011-11-17 NOTE — Assessment & Plan Note (Signed)
Managed by urology 

## 2011-11-17 NOTE — Assessment & Plan Note (Signed)
Unchanged counseled for approx 5 minutes about the need to stop smoking to improve health, no commitment to quitting made

## 2011-11-17 NOTE — Patient Instructions (Addendum)
F/u in 3.5 month  Continue regular physical activity, and ensure you eat a lot of fruit and vegetables  When you have finished these three medicines, DO NOT REFILL  Vitamin D, Hydrochlorothiazide and potassium  STOP omeprazole, and stop drinking sodas, if you have bad heartburn call and let me know  PLEASE START one multivitamin with iron once daily  Fasting chem 7, vit D, cbc and ferritin in 3.5 month before visit   Please think about quitting smoking.  This is very important for your health.  Consider setting a quit date, then cutting back or switching brands to prepare to stop.  Also think of the money you will save every day by not smoking.  Quick Tips to Quit Smoking: Fix a date i.e. keep a date in mind from when you would not touch a tobacco product to smoke  Keep yourself busy and block your mind with work loads or reading books or watching movies in malls where smoking is not allowed  Vanish off the things which reminds you about smoking for example match box, or your favorite lighter, or the pipe you used for smoking, or your favorite jeans and shirt with which you used to enjoy smoking, or the club where you used to do smoking  Try to avoid certain people places and incidences where and with whom smoking is a common factor to add on  Praise yourself with some token gifts from the money you saved by stopping smoking  Anti Smoking teams are there to help you. Join their programs  Anti-smoking Gums are there in many medical shops. Try them to quit smoking   Side-effects of Smoking: Disease caused by smoking cigarettes are emphysema, bronchitis, heart failures  Premature death  Cancer is the major side effect of smoking  Heart attacks and strokes are the quick effects of smoking causing sudden death  Some smokers lives end up with limbs amputated  Breathing problem or fast breathing is another side effect of smoking  Due to more intakes of smokes, carbon mono-oxide goes into  your brain and other muscles of the body which leads to swelling of the veins and blockage to the air passage to lungs  Carbon monoxide blocks blood vessels which leads to blockage in the flow of blood to different major body organs like heart lungs and thus leads to attacks and deaths  During pregnancy smoking is very harmful and leads to premature birth of the infant, spontaneous abortions, low weight of the infant during birth  Fat depositions to narrow and blocked blood vessels causing heart attacks  In many cases cigarette smoking caused infertility in men

## 2011-11-17 NOTE — Assessment & Plan Note (Signed)
Adequate correction will simplify treatment by reducing med prescribed

## 2011-12-04 ENCOUNTER — Telehealth: Payer: Self-pay | Admitting: Family Medicine

## 2011-12-04 NOTE — Telephone Encounter (Signed)
See previous message

## 2011-12-11 ENCOUNTER — Other Ambulatory Visit: Payer: Self-pay | Admitting: Family Medicine

## 2012-03-01 DIAGNOSIS — R5381 Other malaise: Secondary | ICD-10-CM | POA: Diagnosis not present

## 2012-03-01 DIAGNOSIS — M949 Disorder of cartilage, unspecified: Secondary | ICD-10-CM | POA: Diagnosis not present

## 2012-03-01 DIAGNOSIS — M899 Disorder of bone, unspecified: Secondary | ICD-10-CM | POA: Diagnosis not present

## 2012-03-01 DIAGNOSIS — D509 Iron deficiency anemia, unspecified: Secondary | ICD-10-CM | POA: Diagnosis not present

## 2012-03-01 LAB — BASIC METABOLIC PANEL
BUN: 8 mg/dL (ref 6–23)
Chloride: 104 mEq/L (ref 96–112)
Potassium: 4.6 mEq/L (ref 3.5–5.3)
Sodium: 140 mEq/L (ref 135–145)

## 2012-03-01 LAB — CBC WITH DIFFERENTIAL/PLATELET
Eosinophils Relative: 7 % — ABNORMAL HIGH (ref 0–5)
HCT: 41.7 % (ref 39.0–52.0)
Hemoglobin: 14.4 g/dL (ref 13.0–17.0)
Lymphocytes Relative: 47 % — ABNORMAL HIGH (ref 12–46)
Lymphs Abs: 2.4 10*3/uL (ref 0.7–4.0)
MCV: 84.1 fL (ref 78.0–100.0)
Monocytes Absolute: 0.7 10*3/uL (ref 0.1–1.0)
Monocytes Relative: 13 % — ABNORMAL HIGH (ref 3–12)
RBC: 4.96 MIL/uL (ref 4.22–5.81)
WBC: 5.1 10*3/uL (ref 4.0–10.5)

## 2012-03-07 ENCOUNTER — Encounter: Payer: Self-pay | Admitting: Gastroenterology

## 2012-03-08 ENCOUNTER — Encounter: Payer: Self-pay | Admitting: Family Medicine

## 2012-03-08 ENCOUNTER — Ambulatory Visit (INDEPENDENT_AMBULATORY_CARE_PROVIDER_SITE_OTHER): Payer: Medicare Other | Admitting: Family Medicine

## 2012-03-08 VITALS — BP 120/80 | HR 70 | Resp 15 | Ht 69.0 in | Wt 157.0 lb

## 2012-03-08 DIAGNOSIS — F17200 Nicotine dependence, unspecified, uncomplicated: Secondary | ICD-10-CM | POA: Diagnosis not present

## 2012-03-08 DIAGNOSIS — F528 Other sexual dysfunction not due to a substance or known physiological condition: Secondary | ICD-10-CM | POA: Diagnosis not present

## 2012-03-08 DIAGNOSIS — R972 Elevated prostate specific antigen [PSA]: Secondary | ICD-10-CM | POA: Diagnosis not present

## 2012-03-08 DIAGNOSIS — K3189 Other diseases of stomach and duodenum: Secondary | ICD-10-CM

## 2012-03-08 DIAGNOSIS — I10 Essential (primary) hypertension: Secondary | ICD-10-CM

## 2012-03-08 DIAGNOSIS — G378 Other specified demyelinating diseases of central nervous system: Secondary | ICD-10-CM

## 2012-03-08 DIAGNOSIS — R1013 Epigastric pain: Secondary | ICD-10-CM

## 2012-03-08 MED ORDER — DILTIAZEM HCL ER BEADS 360 MG PO CP24: ORAL_CAPSULE | ORAL | Status: DC

## 2012-03-08 NOTE — Assessment & Plan Note (Signed)
Unchanged and stable, wears brace on right leg

## 2012-03-08 NOTE — Progress Notes (Signed)
  Subjective:    Patient ID: Craig Shaffer, male    DOB: 09-24-1960, 51 y.o.   MRN: [id number]  HPI The PT is here for follow up and re-evaluation of chronic medical conditions, medication management and review of any available recent lab and radiology data.  Preventive health is updated, specifically  Cancer screening and Immunization.   Questions or concerns regarding consultations or procedures which the PT has had in the interim are  addressed. The PT denies any adverse reactions to current medications since the last visit.  There are no new concerns.  There are no specific complaints       Review of Systems See HPI Denies recent fever or chills. Denies sinus pressure, nasal congestion, ear pain or sore throat. Denies chest congestion, productive cough or wheezing. Denies chest pains, palpitations and leg swelling Denies abdominal pain, nausea, vomiting,diarrhea or constipation.   Denies dysuria, frequency, hesitancy or incontinence. Denies joint pain, chronic  limitation in mobility. Denies headaches, seizures, numbness, or tingling. Denies depression, anxiety or insomnia. Denies skin break down or rash.       Objective:   Physical Exam  Patient alert and oriented and in no cardiopulmonary distress.  HEENT: No facial asymmetry, EOMI, no sinus tenderness,  oropharynx pink and moist.  Neck supple no adenopathy.  Chest: Clear to auscultation bilaterally.  CVS: S1, S2 no murmurs, no S3.  ABD: Soft non tender. Bowel sounds normal.  Ext: No edema  MS: Adequate though reduced  ROM spine,  hips and knees.  Skin: Intact, no ulcerations or rash noted.  Psych: Good eye contact, normal affect. Memory intact not anxious or depressed appearing.  CNS: CN 2-12 intact, power, tone and sensation normal throughout.       Assessment & Plan:

## 2012-03-08 NOTE — Assessment & Plan Note (Signed)
Followed by urology.   

## 2012-03-08 NOTE — Assessment & Plan Note (Signed)
Smokes 5 per day, plans to quit 06/12/2012 Patient counseled for approximately 5 minutes regarding the health risks of ongoing nicotine use, specifically all types of cancer, heart disease, stroke and respiratory failure. The options available for help with cessation ,the behavioral changes to assist the process, and the option to either gradully reduce usage  Or abruptly stop.is also discussed. Pt is also encouraged to set specific goals in number of cigarettes used daily, as well as to set a quit date.

## 2012-03-08 NOTE — Patient Instructions (Addendum)
F/u  With rectal in 6 month  Please call if you need me before.  Please re consider the flu vaccine  Labs are excellent and so is your blood pressure.  Please check and ensure you keep f/u with urology  Fasting lipid, chem 7 and TSH in 6 month  Quit date for cigarettes is June 12, 2012  Please think about quitting smoking.  This is very important for your health.  Consider setting a quit date, then cutting back or switching brands to prepare to stop.  Also think of the money you will save every day by not smoking.  Quick Tips to Quit Smoking: Fix a date i.e. keep a date in mind from when you would not touch a tobacco product to smoke  Keep yourself busy and block your mind with work loads or reading books or watching movies in malls where smoking is not allowed  Vanish off the things which reminds you about smoking for example match box, or your favorite lighter, or the pipe you used for smoking, or your favorite jeans and shirt with which you used to enjoy smoking, or the club where you used to do smoking  Try to avoid certain people places and incidences where and with whom smoking is a common factor to add on  Praise yourself with some token gifts from the money you saved by stopping smoking  Anti Smoking teams are there to help you. Join their programs  Anti-smoking Gums are there in many medical shops. Try them to quit smoking   Side-effects of Smoking: Disease caused by smoking cigarettes are emphysema, bronchitis, heart failures  Premature death  Cancer is the major side effect of smoking  Heart attacks and strokes are the quick effects of smoking causing sudden death  Some smokers lives end up with limbs amputated  Breathing problem or fast breathing is another side effect of smoking  Due to more intakes of smokes, carbon mono-oxide goes into your brain and other muscles of the body which leads to swelling of the veins and blockage to the air passage to lungs  Carbon  monoxide blocks blood vessels which leads to blockage in the flow of blood to different major body organs like heart lungs and thus leads to attacks and deaths  During pregnancy smoking is very harmful and leads to premature birth of the infant, spontaneous abortions, low weight of the infant during birth  Fat depositions to narrow and blocked blood vessels causing heart attacks  In many cases cigarette smoking caused infertility in men

## 2012-03-08 NOTE — Assessment & Plan Note (Signed)
Using pump for assistance succesfully, prescribed by urology

## 2012-03-08 NOTE — Assessment & Plan Note (Signed)
Asymptomatic off medication 

## 2012-03-08 NOTE — Assessment & Plan Note (Signed)
Controlled, no change in medication DASH diet and commitment to daily physical activity for a minimum of 30 minutes discussed and encouraged, as a part of hypertension management. The importance of attaining a healthy weight is also discussed.  

## 2012-06-14 ENCOUNTER — Other Ambulatory Visit: Payer: Self-pay | Admitting: Family Medicine

## 2012-08-30 ENCOUNTER — Encounter: Payer: Self-pay | Admitting: Family Medicine

## 2012-08-30 ENCOUNTER — Ambulatory Visit (INDEPENDENT_AMBULATORY_CARE_PROVIDER_SITE_OTHER): Payer: Medicare Other | Admitting: Family Medicine

## 2012-08-30 VITALS — BP 130/84 | HR 85 | Resp 16 | Ht 69.0 in | Wt 169.0 lb

## 2012-08-30 DIAGNOSIS — G378 Other specified demyelinating diseases of central nervous system: Secondary | ICD-10-CM

## 2012-08-30 DIAGNOSIS — Z79899 Other long term (current) drug therapy: Secondary | ICD-10-CM | POA: Diagnosis not present

## 2012-08-30 DIAGNOSIS — Z125 Encounter for screening for malignant neoplasm of prostate: Secondary | ICD-10-CM

## 2012-08-30 DIAGNOSIS — R1013 Epigastric pain: Secondary | ICD-10-CM

## 2012-08-30 DIAGNOSIS — I10 Essential (primary) hypertension: Secondary | ICD-10-CM | POA: Diagnosis not present

## 2012-08-30 DIAGNOSIS — Z1211 Encounter for screening for malignant neoplasm of colon: Secondary | ICD-10-CM

## 2012-08-30 DIAGNOSIS — F17200 Nicotine dependence, unspecified, uncomplicated: Secondary | ICD-10-CM

## 2012-08-30 DIAGNOSIS — R972 Elevated prostate specific antigen [PSA]: Secondary | ICD-10-CM

## 2012-08-30 DIAGNOSIS — R5383 Other fatigue: Secondary | ICD-10-CM

## 2012-08-30 DIAGNOSIS — K3189 Other diseases of stomach and duodenum: Secondary | ICD-10-CM

## 2012-08-30 DIAGNOSIS — F528 Other sexual dysfunction not due to a substance or known physiological condition: Secondary | ICD-10-CM

## 2012-08-30 DIAGNOSIS — Z13220 Encounter for screening for lipoid disorders: Secondary | ICD-10-CM

## 2012-08-30 DIAGNOSIS — R5381 Other malaise: Secondary | ICD-10-CM | POA: Diagnosis not present

## 2012-08-30 LAB — HEMOCCULT GUIAC POC 1CARD (OFFICE): Fecal Occult Blood, POC: NEGATIVE

## 2012-08-30 LAB — LIPID PANEL
Cholesterol: 184 mg/dL (ref 0–200)
HDL: 43 mg/dL (ref >39)
LDL Cholesterol: 114 mg/dL — ABNORMAL HIGH (ref 0–99)
Triglycerides: 134 mg/dL (ref <150)
VLDL: 27 mg/dL (ref 0–40)

## 2012-08-30 LAB — BASIC METABOLIC PANEL
BUN: 10 mg/dL (ref 6–23)
CO2: 31 mEq/L (ref 19–32)
Calcium: 9.2 mg/dL (ref 8.4–10.5)
Creat: 1.03 mg/dL (ref 0.50–1.35)

## 2012-08-30 LAB — PSA, MEDICARE: PSA: 2.6 ng/mL (ref <=4.00)

## 2012-08-30 MED ORDER — ASPIRIN 81 MG PO TBEC: DELAYED_RELEASE_TABLET | ORAL | Status: DC

## 2012-08-30 MED ORDER — DILTIAZEM HCL ER BEADS 360 MG PO CP24: ORAL_CAPSULE | ORAL | Status: DC

## 2012-08-30 NOTE — Assessment & Plan Note (Signed)
Lab update and f/u with urology

## 2012-08-30 NOTE — Assessment & Plan Note (Signed)
Stable, no new neurologic symptoms

## 2012-08-30 NOTE — Assessment & Plan Note (Signed)
Improved, asymptomatic off of regular meds

## 2012-08-30 NOTE — Assessment & Plan Note (Signed)
Good results with vacuum pump, managed by urology

## 2012-08-30 NOTE — Patient Instructions (Signed)
Annual wellness in October, call if you need me before  Congrats on cutting back cigarettes, you should give away what you have left and quit no later than your April birthday.  Fasting lipid, chem 7, TSH and PSa today.  We will schedule an appt with Dr Jerre Simon for you.  It is important that you exercise regularly at least 30 minutes 5 times a week. If you develop chest pain, have severe difficulty breathing, or feel very tired, stop exercising immediately and seek medical attention    Blood pressure is good , no med chnage

## 2012-08-30 NOTE — Assessment & Plan Note (Signed)
Controlled, no change in medication DASH diet and commitment to daily physical activity for a minimum of 30 minutes discussed and encouraged, as a part of hypertension management. The importance of attaining a healthy weight is also discussed.  

## 2012-08-30 NOTE — Progress Notes (Signed)
  Subjective:    Patient ID: Craig Shaffer, male    DOB: 02-18-1961, 52 y.o.   MRN: [id number]  HPI The PT is here for follow up and re-evaluation of chronic medical conditions, medication management and review of any available recent lab and radiology data.  Preventive health is updated, specifically  Cancer screening and Immunization.   Questions or concerns regarding consultations or procedures which the PT has had in the interim are  Addressed.Had colonoscopy 06/2011. The PT denies any adverse reactions to current medications since the last visit.  There are no new concerns.  There are no specific complaints  Smoking on avg 2 cigarettes per month, none for the month of January, counseled re the need to quit      Review of Systems See HPI Denies recent fever or chills. Denies sinus pressure, nasal congestion, ear pain or sore throat. Denies chest congestion, productive cough or wheezing. Denies chest pains, palpitations and leg swelling Denies abdominal pain, nausea, vomiting,diarrhea or constipation.   Denies dysuria, frequency, hesitancy or incontinence. Denies joint pain, swelling  Denies headaches, seizures, numbness, or tingling. Denies depression, anxiety or insomnia. Denies skin break down or rash.        Objective:   Physical Exam  Patient alert and oriented and in no cardiopulmonary distress.  HEENT: No facial asymmetry, EOMI, no sinus tenderness,  oropharynx pink and moist.  Neck supple no adenopathy.  Chest: Clear to auscultation bilaterally.  CVS: S1, S2 no murmurs, no S3.  ABD: Soft non tender. Bowel sounds normal. Rectal: no mass, prostate smooth, no nodules, not enlarged. Heme negative stool Ext: No edema  MS: Adequate ROM spine, shoulders, hips and knees.  Skin: Intact, no ulcerations or rash noted.  Psych: Good eye contact, normal affect. Memory intact not anxious or depressed appearing.  CNS: CN 2-12 intact, power, tone and sensation  normal throughout.       Assessment & Plan:

## 2012-08-30 NOTE — Addendum Note (Signed)
Addended by: Abner Greenspan on: 08/30/2012 08:43 AM   Modules accepted: Orders

## 2012-08-30 NOTE — Assessment & Plan Note (Signed)
Improved, smokes on avg 2 per month, needs to quit. Patient counseled for approximately 5 minutes regarding the health risks of ongoing nicotine use, specifically all types of cancer, heart disease, stroke and respiratory failure. The options available for help with cessation ,the behavioral changes to assist the process, and the option to either gradully reduce usage  Or abruptly stop.is also discussed. Pt is also encouraged to set specific goals in number of cigarettes used daily, as well as to set a quit date.

## 2012-10-11 ENCOUNTER — Ambulatory Visit (INDEPENDENT_AMBULATORY_CARE_PROVIDER_SITE_OTHER): Payer: Medicare Other | Admitting: Urology

## 2012-10-11 DIAGNOSIS — N529 Male erectile dysfunction, unspecified: Secondary | ICD-10-CM

## 2012-10-11 DIAGNOSIS — R972 Elevated prostate specific antigen [PSA]: Secondary | ICD-10-CM

## 2012-10-11 DIAGNOSIS — N402 Nodular prostate without lower urinary tract symptoms: Secondary | ICD-10-CM

## 2013-03-05 ENCOUNTER — Telehealth: Payer: Self-pay | Admitting: Family Medicine

## 2013-03-05 NOTE — Telephone Encounter (Signed)
Letter written and put to be signed by Dr

## 2013-03-05 NOTE — Telephone Encounter (Signed)
Pls priont /tyoe letter to excuse opt from jury duty on medical grounds, I will sign (He has a neurologic disease which makes sitting for prolonged periods painful)

## 2013-04-04 ENCOUNTER — Ambulatory Visit (INDEPENDENT_AMBULATORY_CARE_PROVIDER_SITE_OTHER): Payer: Medicare Other | Admitting: Family Medicine

## 2013-04-04 ENCOUNTER — Encounter: Payer: Self-pay | Admitting: Family Medicine

## 2013-04-04 VITALS — BP 132/84 | HR 73 | Resp 16 | Wt 164.4 lb

## 2013-04-04 DIAGNOSIS — Z0000 Encounter for general adult medical examination without abnormal findings: Secondary | ICD-10-CM | POA: Insufficient documentation

## 2013-04-04 DIAGNOSIS — Z79899 Other long term (current) drug therapy: Secondary | ICD-10-CM | POA: Diagnosis not present

## 2013-04-04 DIAGNOSIS — I10 Essential (primary) hypertension: Secondary | ICD-10-CM | POA: Diagnosis not present

## 2013-04-04 DIAGNOSIS — Z23 Encounter for immunization: Secondary | ICD-10-CM

## 2013-04-04 DIAGNOSIS — F528 Other sexual dysfunction not due to a substance or known physiological condition: Secondary | ICD-10-CM

## 2013-04-04 LAB — CBC WITH DIFFERENTIAL/PLATELET
Eosinophils Absolute: 0.2 10*3/uL (ref 0.0–0.7)
Hemoglobin: 13.9 g/dL (ref 13.0–17.0)
Lymphocytes Relative: 29 % (ref 12–46)
Lymphs Abs: 1.4 10*3/uL (ref 0.7–4.0)
MCH: 29.7 pg (ref 26.0–34.0)
Monocytes Relative: 17 % — ABNORMAL HIGH (ref 3–12)
Neutrophils Relative %: 49 % (ref 43–77)
RBC: 4.68 MIL/uL (ref 4.22–5.81)
WBC: 5 10*3/uL (ref 4.0–10.5)

## 2013-04-04 LAB — BASIC METABOLIC PANEL
BUN: 9 mg/dL (ref 6–23)
Calcium: 9.2 mg/dL (ref 8.4–10.5)
Creat: 0.86 mg/dL (ref 0.50–1.35)
Glucose, Bld: 77 mg/dL (ref 70–99)
Potassium: 4.1 mEq/L (ref 3.5–5.3)

## 2013-04-04 LAB — LIPID PANEL
Cholesterol: 166 mg/dL (ref 0–200)
HDL: 44 mg/dL (ref >39)
Total CHOL/HDL Ratio: 3.8 Ratio
Triglycerides: 74 mg/dL (ref <150)
VLDL: 15 mg/dL (ref 0–40)

## 2013-04-04 MED ORDER — DILTIAZEM HCL ER BEADS 360 MG PO CP24: ORAL_CAPSULE | ORAL | Status: DC

## 2013-04-04 NOTE — Progress Notes (Signed)
Subjective:    Patient ID: Craig Shaffer, male    DOB: Feb 07, 1961, 52 y.o.   MRN: [id number]  HPI Preventive Screening-Counseling & Management   Patient present here today for a Medicare annual wellness visit.   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 No  Over the past two weeks, have you felt little interest or pleasure in doing things? No  Have you lost interest or pleasure in daily life? No  Do you often feel hopeless? No  Do you cry easily over simple problems? No   Activities of Daily Living  In your present state of health, do you have any difficulty performing the following activities?  Driving?: yes, unable due to weakness in lower extremities and toes Managing money?: No Feeding yourself?:No Getting from bed to chair?:No Climbing a flight of stairs?:No Preparing food and eating?:No Bathing or showering?:No Getting dressed?:No Getting to the toilet?:No Using the toilet?:No Moving around from place to place?: No  Fall Risk Assessment In the past year have you fallen or had a near fall?:No Are you currently taking any medications that make you dizzy?:No   Hearing Difficulties: No Do you often ask people to speak up or repeat themselves?:No Do you experience ringing or noises in your ears?:No Do you have  difficulty understanding soft or whispered voices?:No  Cognitive Testing  Alert? Yes Normal Appearance?Yes  Oriented to person? Yes Place? Yes  Time? Yes  Displays appropriate judgment?Yes  Can read the correct time from a watch face? yes Are you having problems remembering things?No  Advanced Directives have been discussed with the patient?Yes,full code ,     List the Names of Other Physician/Practitioners you currently use: urology   Indicate any recent Medical Services you may have received from other than [facility] providers in the past year (date may be approximate).   Assessment:    Annual Wellness Exam   Plan:    During the course of the visit the patient was educated and counseled about appropriate screening and preventive services including:  A healthy diet is rich in fruit, vegetables and whole grains. Poultry fish, nuts and beans are a healthy choice for protein rather then red meat. A low sodium diet and drinking 64 ounces of water daily is generally recommended. Oils and sweet should be limited. Carbohydrates especially for those who are diabetic or overweight, should be limited to 30-45 gram per meal. It is important to eat on a regular schedule, at least 3 times daily. Snacks should be primarily fruits, vegetables or nuts. It is important that you exercise regularly at least 30 minutes 5 times a week. If you develop chest pain, have severe difficulty breathing, or feel very tired, stop exercising immediately and seek medical attention  Immunization reviewed and updated. Cancer screening reviewed and updated    Patient Instructions (the written plan) was given to the  patient.  Medicare Attestation  I have personally reviewed:  The patient's medical and social history  Their use of alcohol, tobacco or illicit drugs  Their current medications and supplements  The patient's functional ability including ADLs,fall risks, home safety risks, cognitive, and hearing and visual  impairment  Diet and physical activities  Evidence for depression or mood disorders  The patient's weight, height, BMI, and visual acuity have been recorded in the chart. I have made referrals, counseling, and provided education to the patient based on review of the above and I have provided the patient with a written personalized care plan for preventive services.      Review of Systems     Objective:   Physical Exam        Assessment & Plan:

## 2013-04-04 NOTE — Patient Instructions (Addendum)
F/u in end April,with rectal exam,call if you need me before  Flu vaccine today  CBC, lipid, chem 7 today

## 2013-04-04 NOTE — Assessment & Plan Note (Signed)
Annual wellness as documented Fully functional, despite neurologic disease. End of life issues discussed and clarified. Pt to continue healthy lifestyle, except that he needs to quit smoking but is still not willing to do so

## 2013-07-11 ENCOUNTER — Ambulatory Visit (INDEPENDENT_AMBULATORY_CARE_PROVIDER_SITE_OTHER): Payer: Medicare Other | Admitting: Urology

## 2013-07-11 DIAGNOSIS — N529 Male erectile dysfunction, unspecified: Secondary | ICD-10-CM

## 2013-07-11 DIAGNOSIS — R972 Elevated prostate specific antigen [PSA]: Secondary | ICD-10-CM | POA: Diagnosis not present

## 2013-07-11 DIAGNOSIS — N402 Nodular prostate without lower urinary tract symptoms: Secondary | ICD-10-CM | POA: Diagnosis not present

## 2013-07-11 DIAGNOSIS — N138 Other obstructive and reflux uropathy: Secondary | ICD-10-CM

## 2013-07-11 DIAGNOSIS — N403 Nodular prostate with lower urinary tract symptoms: Secondary | ICD-10-CM

## 2013-10-03 ENCOUNTER — Encounter: Payer: Self-pay | Admitting: Family Medicine

## 2013-10-03 ENCOUNTER — Encounter (INDEPENDENT_AMBULATORY_CARE_PROVIDER_SITE_OTHER): Payer: Self-pay

## 2013-10-03 ENCOUNTER — Ambulatory Visit (INDEPENDENT_AMBULATORY_CARE_PROVIDER_SITE_OTHER): Payer: Medicare Other | Admitting: Family Medicine

## 2013-10-03 VITALS — BP 130/82 | HR 74 | Resp 16 | Wt 161.0 lb

## 2013-10-03 DIAGNOSIS — F528 Other sexual dysfunction not due to a substance or known physiological condition: Secondary | ICD-10-CM

## 2013-10-03 DIAGNOSIS — Z79899 Other long term (current) drug therapy: Secondary | ICD-10-CM

## 2013-10-03 DIAGNOSIS — E559 Vitamin D deficiency, unspecified: Secondary | ICD-10-CM

## 2013-10-03 DIAGNOSIS — G378 Other specified demyelinating diseases of central nervous system: Secondary | ICD-10-CM | POA: Diagnosis not present

## 2013-10-03 DIAGNOSIS — I10 Essential (primary) hypertension: Secondary | ICD-10-CM | POA: Diagnosis not present

## 2013-10-03 DIAGNOSIS — J309 Allergic rhinitis, unspecified: Secondary | ICD-10-CM

## 2013-10-03 DIAGNOSIS — F17200 Nicotine dependence, unspecified, uncomplicated: Secondary | ICD-10-CM | POA: Diagnosis not present

## 2013-10-03 MED ORDER — DILTIAZEM HCL ER BEADS 360 MG PO CP24: ORAL_CAPSULE | ORAL | Status: DC

## 2013-10-03 NOTE — Patient Instructions (Addendum)
F/u in 6 month, call if you need me before  Exam is good.  HAPPY BIRTHDAY!, hope yoou have many , many more good years!  No more cigarettes starting May 1, GREAT decision!   CBC, fasting lipid, chem 7 , TSh and vit D in 6 month, before visit  It is important that you exercise regularly at least 30 minutes 5 times a week. If you develop chest pain, have severe difficulty breathing, or feel very tired, stop exercising immediately and seek medical attention

## 2013-10-03 NOTE — Progress Notes (Signed)
   Subjective:    Patient ID: Craig Shaffer, male    DOB: 1960-06-27, 53 y.o.   MRN: [id number]  HPI The PT is here for follow up and re-evaluation of chronic medical conditions, medication management and review of any available recent lab and radiology data.  Preventive health is updated, specifically  Cancer screening and Immunization.   Has had his annual urology eval , he uses a pump for ED successfully, also has had lab from urology. The PT denies any adverse reactions to current medications since the last visit.  Mom recently diagnosed with recurrent liver ca she is 55y/o trying to handle as well as able, initial dx was approx 7 years ago B/day today intend to stop smoking May1    Review of Systems See HPI Denies recent fever or chills. Denies sinus pressure, nasal congestion, ear pain or sore throat. Denies chest congestion, productive cough or wheezing. Denies chest pains, palpitations and leg swelling Denies abdominal pain, nausea, vomiting,diarrhea or constipation.   Denies dysuria, frequency, hesitancy or incontinence. Chronic right lower ext pain  and limitation in mob with weakness due to neurolgical ds which is not progressive. Denies headaches, seizures, numbness, or tingling. Denies depression, anxiety or insomnia. Denies skin break down or rash.        Objective:   Physical Exam  BP 130/82  Pulse 74  Resp 16  Wt 161 lb (73.029 kg)  SpO2 97% Patient alert and oriented and in no cardiopulmonary distress.  HEENT: No facial asymmetry, EOMI, no sinus tenderness,  oropharynx pink and moist.  Neck supple no adenopathy.  Chest: Clear to auscultation bilaterally.  CVS: S1, S2 no murmurs, no S3.  ABD: Soft non tender. Bowel sounds normal.  Ext: No edema  MS: Adequate ROM spine, shoulders, hips and knees.Reducedin right lower extremity  Skin: Intact, no ulcerations or rash noted.  Psych: Good eye contact, normal affect. Memory intact not anxious or  depressed appearing.  CNS: CN 2-12 intact, power and  tone  reduced in right lower extremity , otherwise normal      Assessment & Plan:  HYPERTENSION Controlled, no change in medication   OTHER DEMYELINATING DISEASES OF CNTRL NERV SYS Stable and unchnaged, pt encouraged to continue regular exercise, and maintain a healthy weight  NICOTINE ADDICTION Patient counseled for approximately 5 minutes regarding the health risks of ongoing nicotine use, specifically all types of cancer, heart disease, stroke and respiratory failure. The options available for help with cessation ,the behavioral changes to assist the process, and the option to either gradully reduce usage  Or abruptly stop.is also discussed. Pt is also encouraged to set specific goals in number of cigarettes used daily, as well as to set a quit date. Intends to quit in 1 week  ERECTILE DYSFUNCTION Well managed with pump, followed annually by urology  Allergic rhinitis Increased symptoms, as expected in te Spring, however OTC medication intermittently is suficient. Counseled to use a face mask in the event of excessive exposure to pollen

## 2013-10-04 ENCOUNTER — Encounter: Payer: Self-pay | Admitting: Family Medicine

## 2013-10-04 DIAGNOSIS — J309 Allergic rhinitis, unspecified: Secondary | ICD-10-CM | POA: Insufficient documentation

## 2013-10-04 NOTE — Assessment & Plan Note (Signed)
Well managed with pump, followed annually by urology

## 2013-10-04 NOTE — Assessment & Plan Note (Signed)
Controlled, no change in medication  

## 2013-10-04 NOTE — Assessment & Plan Note (Addendum)
Increased symptoms, as expected in te Spring, however OTC medication intermittently is suficient. Counseled to use a face mask in the event of excessive exposure to pollen

## 2013-10-04 NOTE — Assessment & Plan Note (Signed)
Stable and unchnaged, pt encouraged to continue regular exercise, and maintain a healthy weight

## 2013-10-04 NOTE — Assessment & Plan Note (Signed)
Patient counseled for approximately 5 minutes regarding the health risks of ongoing nicotine use, specifically all types of cancer, heart disease, stroke and respiratory failure. The options available for help with cessation ,the behavioral changes to assist the process, and the option to either gradully reduce usage  Or abruptly stop.is also discussed. Pt is also encouraged to set specific goals in number of cigarettes used daily, as well as to set a quit date. Intends to quit in 1 week

## 2014-03-05 ENCOUNTER — Telehealth: Payer: Self-pay | Admitting: Family Medicine

## 2014-03-06 ENCOUNTER — Other Ambulatory Visit: Payer: Self-pay | Admitting: Family Medicine

## 2014-03-06 ENCOUNTER — Other Ambulatory Visit: Payer: Self-pay

## 2014-03-06 DIAGNOSIS — R1013 Epigastric pain: Secondary | ICD-10-CM

## 2014-03-06 MED ORDER — OMEPRAZOLE 40 MG PO CPDR: 40.0000 mg | DELAYED_RELEASE_CAPSULE | Freq: Every day | ORAL | Status: DC

## 2014-03-06 NOTE — Telephone Encounter (Signed)
Medication refilled to pharmacy.  

## 2014-03-11 DIAGNOSIS — I10 Essential (primary) hypertension: Secondary | ICD-10-CM | POA: Diagnosis not present

## 2014-03-11 DIAGNOSIS — Z79899 Other long term (current) drug therapy: Secondary | ICD-10-CM | POA: Diagnosis not present

## 2014-03-11 DIAGNOSIS — E559 Vitamin D deficiency, unspecified: Secondary | ICD-10-CM | POA: Diagnosis not present

## 2014-03-12 LAB — CBC WITH DIFFERENTIAL/PLATELET
BASOS PCT: 0 % (ref 0–1)
Basophils Absolute: 0 10*3/uL (ref 0.0–0.1)
EOS ABS: 0.1 10*3/uL (ref 0.0–0.7)
Eosinophils Relative: 3 % (ref 0–5)
HEMATOCRIT: 41 % (ref 39.0–52.0)
HEMOGLOBIN: 14 g/dL (ref 13.0–17.0)
Lymphocytes Relative: 46 % (ref 12–46)
Lymphs Abs: 2.2 10*3/uL (ref 0.7–4.0)
MCH: 29.3 pg (ref 26.0–34.0)
MCHC: 34.1 g/dL (ref 30.0–36.0)
MCV: 85.8 fL (ref 78.0–100.0)
MONO ABS: 0.7 10*3/uL (ref 0.1–1.0)
MONOS PCT: 14 % — AB (ref 3–12)
Neutro Abs: 1.8 10*3/uL (ref 1.7–7.7)
Neutrophils Relative %: 37 % — ABNORMAL LOW (ref 43–77)
Platelets: 231 10*3/uL (ref 150–400)
RBC: 4.78 MIL/uL (ref 4.22–5.81)
RDW: 15.3 % (ref 11.5–15.5)
WBC: 4.8 10*3/uL (ref 4.0–10.5)

## 2014-03-12 LAB — VITAMIN D 25 HYDROXY (VIT D DEFICIENCY, FRACTURES): VIT D 25 HYDROXY: 29 ng/mL — AB (ref 30–89)

## 2014-03-12 LAB — LIPID PANEL
Cholesterol: 160 mg/dL (ref 0–200)
HDL: 41 mg/dL (ref >39)
LDL CALC: 95 mg/dL (ref 0–99)
TRIGLYCERIDES: 120 mg/dL (ref <150)
Total CHOL/HDL Ratio: 3.9 Ratio
VLDL: 24 mg/dL (ref 0–40)

## 2014-03-12 LAB — BASIC METABOLIC PANEL
BUN: 11 mg/dL (ref 6–23)
CHLORIDE: 101 meq/L (ref 96–112)
CO2: 28 mEq/L (ref 19–32)
CREATININE: 1.05 mg/dL (ref 0.50–1.35)
Calcium: 9.2 mg/dL (ref 8.4–10.5)
Glucose, Bld: 95 mg/dL (ref 70–99)
POTASSIUM: 3.9 meq/L (ref 3.5–5.3)
Sodium: 138 mEq/L (ref 135–145)

## 2014-03-12 LAB — TSH: TSH: 1.145 u[IU]/mL (ref 0.350–4.500)

## 2014-03-16 ENCOUNTER — Encounter: Payer: Self-pay | Admitting: Family Medicine

## 2014-03-16 ENCOUNTER — Ambulatory Visit (INDEPENDENT_AMBULATORY_CARE_PROVIDER_SITE_OTHER): Payer: Medicare Other | Admitting: Family Medicine

## 2014-03-16 VITALS — BP 130/80 | HR 69 | Resp 16 | Ht 69.0 in | Wt 165.0 lb

## 2014-03-16 DIAGNOSIS — F17210 Nicotine dependence, cigarettes, uncomplicated: Secondary | ICD-10-CM

## 2014-03-16 DIAGNOSIS — Z720 Tobacco use: Secondary | ICD-10-CM

## 2014-03-16 DIAGNOSIS — I10 Essential (primary) hypertension: Secondary | ICD-10-CM | POA: Diagnosis not present

## 2014-03-16 DIAGNOSIS — Z23 Encounter for immunization: Secondary | ICD-10-CM

## 2014-03-16 DIAGNOSIS — F528 Other sexual dysfunction not due to a substance or known physiological condition: Secondary | ICD-10-CM

## 2014-03-16 DIAGNOSIS — F17200 Nicotine dependence, unspecified, uncomplicated: Secondary | ICD-10-CM

## 2014-03-16 NOTE — Patient Instructions (Addendum)
Annual exam with rectal in 5 months, call if you need me  before  Sorry to hear of your Mom's passing  Aim to quit nicotine by dec 31, this will be good for you  Need to stop the sodas and sweets , blood sugar has increased , but cholesterol and all other labs are excellent  No changes in meds  Flu vaccine   Fasting chem 7 in 5 month

## 2014-03-16 NOTE — Assessment & Plan Note (Signed)
Controlled, no change in medication DASH diet and commitment to daily physical activity for a minimum of 30 minutes discussed and encouraged, as a part of hypertension management. The importance of attaining a healthy weight is also discussed.  

## 2014-03-16 NOTE — Assessment & Plan Note (Signed)
Patient counseled for approximately 5 minutes regarding the health risks of ongoing nicotine use, specifically all types of cancer, heart disease, stroke and respiratory failure. The options available for help with cessation ,the behavioral changes to assist the process, and the option to either gradully reduce usage  Or abruptly stop.is also discussed. Pt is also encouraged to set specific goals in number of cigarettes used daily, as well as to set a quit date. Quit date set for 06/11/2014

## 2014-03-16 NOTE — Assessment & Plan Note (Signed)
Good result with injections followed by urology

## 2014-03-16 NOTE — Progress Notes (Signed)
   Subjective:    Patient ID: Craig Shaffer, male    DOB: Oct 31, 1960, 53 y.o.   MRN: [id number]  HPI The PT is here for follow up and re-evaluation of chronic medical conditions, medication management and review of any available recent lab and radiology data.  Preventive health is updated, specifically  Cancer screening and Immunization.   . The PT denies any adverse reactions to current medications since the last visit.  He resumed smoking in June after his Mom passed from metastatic liver cancer Plans to quit in end of the year. Has increased sweet and soda intake,  fasting blood sugar now elevated, but still within norm Has upcoming urology appt next February    Review of Systems See HPI Denies recent fever or chills. Denies sinus pressure, nasal congestion, ear pain or sore throat. Denies chest congestion, productive cough or wheezing. Denies chest pains, palpitations and leg swelling Denies abdominal pain, nausea, vomiting,diarrhea or constipation.   Denies dysuria, frequency, hesitancy or incontinence. chronic  limitation in mobility due to neurologcs disease Denies headaches, seizures, numbness, or tingling. Denies depression, anxiety or insomnia. Denies skin break down or rash.        Objective:   Physical Exam BP 130/80  Pulse 69  Resp 16  Ht 5\' 9"  (1.753 m)  Wt 165 lb (74.844 kg)  BMI 24.36 kg/m2  SpO2 99% Patient alert and oriented and in no cardiopulmonary distress.  HEENT: No facial asymmetry, EOMI,   oropharynx pink and moist.  Neck supple no JVD, no mass.  Chest: Clear to auscultation bilaterally.  CVS: S1, S2 no murmurs, no S3.Regular rate.  ABD: Soft non tender.   Ext: No edema  MS: Adequate ROM spine, shoulders, hips and knees.  Skin: Intact, no ulcerations or rash noted.  Psych: Good eye contact, normal affect. Memory intact not anxious or depressed appearing.  CNS: CN 2-12 intact, power,  normal throughout.no focal deficits  noted.        Assessment & Plan:

## 2014-03-16 NOTE — Assessment & Plan Note (Signed)
Vaccine administered at visit.  

## 2014-03-16 NOTE — Assessment & Plan Note (Signed)
Chronic lower extremity weakness and unsteady gait, relies on brace. No h/o falls

## 2014-05-18 ENCOUNTER — Other Ambulatory Visit: Payer: Self-pay | Admitting: Family Medicine

## 2014-07-07 DIAGNOSIS — N402 Nodular prostate without lower urinary tract symptoms: Secondary | ICD-10-CM | POA: Diagnosis not present

## 2014-07-17 ENCOUNTER — Ambulatory Visit (INDEPENDENT_AMBULATORY_CARE_PROVIDER_SITE_OTHER): Payer: Medicare Other | Admitting: Urology

## 2014-07-17 DIAGNOSIS — R972 Elevated prostate specific antigen [PSA]: Secondary | ICD-10-CM | POA: Diagnosis not present

## 2014-07-17 DIAGNOSIS — N5201 Erectile dysfunction due to arterial insufficiency: Secondary | ICD-10-CM | POA: Diagnosis not present

## 2014-07-17 DIAGNOSIS — N402 Nodular prostate without lower urinary tract symptoms: Secondary | ICD-10-CM | POA: Diagnosis not present

## 2014-08-18 ENCOUNTER — Encounter: Payer: Medicare Other | Admitting: Family Medicine

## 2014-09-30 ENCOUNTER — Telehealth: Payer: Self-pay

## 2014-09-30 DIAGNOSIS — Z125 Encounter for screening for malignant neoplasm of prostate: Secondary | ICD-10-CM

## 2014-09-30 DIAGNOSIS — R7302 Impaired glucose tolerance (oral): Secondary | ICD-10-CM | POA: Diagnosis not present

## 2014-09-30 DIAGNOSIS — R972 Elevated prostate specific antigen [PSA]: Secondary | ICD-10-CM | POA: Diagnosis not present

## 2014-09-30 DIAGNOSIS — I10 Essential (primary) hypertension: Secondary | ICD-10-CM | POA: Diagnosis not present

## 2014-09-30 DIAGNOSIS — E669 Obesity, unspecified: Secondary | ICD-10-CM

## 2014-09-30 LAB — CBC
HCT: 40.3 % (ref 39.0–52.0)
HEMOGLOBIN: 14 g/dL (ref 13.0–17.0)
MCH: 29.7 pg (ref 26.0–34.0)
MCHC: 34.7 g/dL (ref 30.0–36.0)
MCV: 85.4 fL (ref 78.0–100.0)
MPV: 10.9 fL (ref 8.6–12.4)
PLATELETS: 217 10*3/uL (ref 150–400)
RBC: 4.72 MIL/uL (ref 4.22–5.81)
RDW: 15.1 % (ref 11.5–15.5)
WBC: 4.3 10*3/uL (ref 4.0–10.5)

## 2014-09-30 LAB — LIPID PANEL
CHOL/HDL RATIO: 3.3 ratio
Cholesterol: 150 mg/dL (ref 0–200)
HDL: 45 mg/dL (ref >=40)
LDL Cholesterol: 87 mg/dL (ref 0–99)
TRIGLYCERIDES: 90 mg/dL (ref <150)
VLDL: 18 mg/dL (ref 0–40)

## 2014-09-30 LAB — TSH: TSH: 1.188 u[IU]/mL (ref 0.350–4.500)

## 2014-09-30 LAB — BASIC METABOLIC PANEL
BUN: 8 mg/dL (ref 6–23)
CHLORIDE: 104 meq/L (ref 96–112)
CO2: 26 mEq/L (ref 19–32)
Calcium: 9 mg/dL (ref 8.4–10.5)
Creat: 1.09 mg/dL (ref 0.50–1.35)
GLUCOSE: 81 mg/dL (ref 70–99)
POTASSIUM: 4.3 meq/L (ref 3.5–5.3)
Sodium: 136 mEq/L (ref 135–145)

## 2014-09-30 NOTE — Telephone Encounter (Signed)
Labs ordered prior to next visit.

## 2014-10-01 LAB — HEMOGLOBIN A1C
HEMOGLOBIN A1C: 5.7 % — AB (ref <5.7)
MEAN PLASMA GLUCOSE: 117 mg/dL — AB (ref <117)

## 2014-10-01 LAB — PSA, MEDICARE: PSA: 3.49 ng/mL (ref <=4.00)

## 2014-10-06 ENCOUNTER — Ambulatory Visit (INDEPENDENT_AMBULATORY_CARE_PROVIDER_SITE_OTHER): Payer: Medicare Other | Admitting: Family Medicine

## 2014-10-06 ENCOUNTER — Encounter: Payer: Self-pay | Admitting: Family Medicine

## 2014-10-06 VITALS — BP 130/80 | HR 66 | Resp 18 | Ht 69.0 in | Wt 166.0 lb

## 2014-10-06 DIAGNOSIS — R0600 Dyspnea, unspecified: Secondary | ICD-10-CM | POA: Insufficient documentation

## 2014-10-06 DIAGNOSIS — R0789 Other chest pain: Secondary | ICD-10-CM | POA: Diagnosis not present

## 2014-10-06 DIAGNOSIS — Z0000 Encounter for general adult medical examination without abnormal findings: Secondary | ICD-10-CM | POA: Diagnosis not present

## 2014-10-06 DIAGNOSIS — F17210 Nicotine dependence, cigarettes, uncomplicated: Secondary | ICD-10-CM

## 2014-10-06 DIAGNOSIS — R7302 Impaired glucose tolerance (oral): Secondary | ICD-10-CM

## 2014-10-06 DIAGNOSIS — Z1211 Encounter for screening for malignant neoplasm of colon: Secondary | ICD-10-CM

## 2014-10-06 DIAGNOSIS — F17200 Nicotine dependence, unspecified, uncomplicated: Secondary | ICD-10-CM

## 2014-10-06 DIAGNOSIS — Z720 Tobacco use: Secondary | ICD-10-CM

## 2014-10-06 LAB — HEMOCCULT GUIAC POC 1CARD (OFFICE): Fecal Occult Blood, POC: NEGATIVE

## 2014-10-06 NOTE — Addendum Note (Signed)
Addended by: Denman George B on: 10/06/2014 08:53 AM   Modules accepted: Orders

## 2014-10-06 NOTE — Addendum Note (Signed)
Addended by: Denman George B on: 10/06/2014 04:09 PM   Modules accepted: Orders

## 2014-10-06 NOTE — Progress Notes (Signed)
   Subjective:    Patient ID: Craig Shaffer, male    DOB: 10-17-1960, 54 y.o.   MRN: [id number]  HPI Patient is in for annual physical exam. No other health concerns are expressed at the visit.Fells well.Recent labs,  are reviewed. Immunization is reviewed , and  updated if needed.\    Review of Systems See HPI .    Objective:   Physical Exam BP 130/80 mmHg  Pulse 66  Resp 18  Ht 5\' 9"  (1.753 m)  Wt 166 lb (75.297 kg)  BMI 24.50 kg/m2  SpO2 100%  Pleasant well nourished male, alert and oriented x 3, in no cardio-pulmonary distress. Afebrile. HEENT No facial trauma or asymetry. Sinuses non tender. EOMI, PERTL, fundoscopic exam is negative for hemorhages or exudates. External ears normal, tympanic membranes clear. Oropharynx moist, no exudate, good dentition. Neck: supple, no adenopathy,JVD or thyromegaly.No bruits.  Chest: Clear to ascultation bilaterally.No crackles or wheezes. Non tender to palpation  Breast: No asymetry,no masses. No nipple discharge or inversion. No axillary or supraclavicular adenopathy  Cardiovascular system; Heart sounds normal,  S1 and  S2 ,no S3.  No murmur, or thrill. Apical beat not displaced Peripheral pulses normal.  Abdomen: Soft, non tender, no organomegaly or masses. No bruits. Bowel sounds normal. No guarding, tenderness or rebound.  Rectal:  Normal sphincter tone. External hemorrhoid. guaiac negative stool. Prostate smooth and firm  GU: No penile lesion or discharge. No testicular mass or tenderness.  Musculoskeletal exam: Decreased  ROM of spine, hips ,  and knees.Normal in shoulders No deformity ,swelling or crepitus noted.  muscle wasting of right lower extremity  Neurologic: Cranial nerves 2 to 12 intact. Decreased Power and  tone in right lower extremity Abnormal  gait. No tremor.  Skin: Intact, no ulceration, erythema , scaling or rash noted. Pigmentation normal throughout  Psych; Normal mood and  affect. Judgement and concentration normal        Assessment & Plan:  Annual physical exam Annual exam as documented. Counseling done  re healthy lifestyle involving commitment to 150 minutes exercise per week, heart healthy diet, and attaining healthy weight.The importance of adequate sleep also discussed. Regular seat belt use and home safety, is also discussed. Changes in health habits are decided on by the patient with goals and time frames  set for achieving them. Immunization and cancer screening needs are specifically addressed at this visit.    NICOTINE ADDICTION Patient counseled for approximately 5 minutes regarding the health risks of ongoing nicotine use, specifically all types of cancer, heart disease, stroke and respiratory failure. The options available for help with cessation ,the behavioral changes to assist the process, and the option to either gradully reduce usage  Or abruptly stop.is also discussed. Pt is also encouraged to set specific goals in number of cigarettes used daily, as well as to set a quit date.  Number of cigarettes/cigars currently smoking daily: 7 to 10    Dyspnea EKG in office today Encouraged smoking cessation    Atypical chest pain Occasional chest discomfort, no specific aggravating or relieving factor Nicotine and IGT, EKG today, sinus bradyacardia at 59, no ischemic changes, no LVH

## 2014-10-06 NOTE — Assessment & Plan Note (Signed)

## 2014-10-06 NOTE — Assessment & Plan Note (Signed)
Patient counseled for approximately 5 minutes regarding the health risks of ongoing nicotine use, specifically all types of cancer, heart disease, stroke and respiratory failure. The options available for help with cessation ,the behavioral changes to assist the process, and the option to either gradully reduce usage  Or abruptly stop.is also discussed. Pt is also encouraged to set specific goals in number of cigarettes used daily, as well as to set a quit date.  Number of cigarettes/cigars currently smoking daily: 7 to 10  

## 2014-10-06 NOTE — Patient Instructions (Addendum)
Annual wellness in 5 months, call if you need me before  Please continue to work on smoking cessation, will improve quality and length of life  EKG today shows no sign of heart damage , which is good  Change from sodas to water, your blood sugar is slightly increased  HBA1C, chem 7 and EGFr in 5 month

## 2014-10-06 NOTE — Assessment & Plan Note (Addendum)
Occasional chest discomfort, no specific aggravating or relieving factor Nicotine and IGT, EKG today, sinus bradyacardia at 59, no ischemic changes, no LVH

## 2014-10-06 NOTE — Assessment & Plan Note (Signed)
EKG in office today Encouraged smoking cessation

## 2014-10-23 ENCOUNTER — Encounter: Payer: Self-pay | Admitting: Family Medicine

## 2014-12-03 ENCOUNTER — Other Ambulatory Visit: Payer: Self-pay | Admitting: Family Medicine

## 2015-03-06 ENCOUNTER — Other Ambulatory Visit: Payer: Self-pay | Admitting: Family Medicine

## 2015-03-06 DIAGNOSIS — Z0000 Encounter for general adult medical examination without abnormal findings: Secondary | ICD-10-CM | POA: Diagnosis not present

## 2015-03-06 DIAGNOSIS — R7302 Impaired glucose tolerance (oral): Secondary | ICD-10-CM | POA: Diagnosis not present

## 2015-03-06 DIAGNOSIS — Z114 Encounter for screening for human immunodeficiency virus [HIV]: Secondary | ICD-10-CM | POA: Diagnosis not present

## 2015-03-06 DIAGNOSIS — Z1159 Encounter for screening for other viral diseases: Secondary | ICD-10-CM | POA: Diagnosis not present

## 2015-03-06 LAB — COMPLETE METABOLIC PANEL WITH GFR
ALT: 10 U/L (ref 9–46)
AST: 15 U/L (ref 10–35)
Albumin: 3.7 g/dL (ref 3.6–5.1)
Alkaline Phosphatase: 72 U/L (ref 40–115)
BILIRUBIN TOTAL: 0.3 mg/dL (ref 0.2–1.2)
BUN: 10 mg/dL (ref 7–25)
CO2: 27 mmol/L (ref 20–31)
Calcium: 8.9 mg/dL (ref 8.6–10.3)
Chloride: 106 mmol/L (ref 98–110)
Creat: 1.1 mg/dL (ref 0.70–1.33)
GFR, EST NON AFRICAN AMERICAN: 75 mL/min (ref >=60)
GFR, Est African American: 86 mL/min (ref >=60)
GLUCOSE: 82 mg/dL (ref 65–99)
Potassium: 4 mmol/L (ref 3.5–5.3)
SODIUM: 139 mmol/L (ref 135–146)
TOTAL PROTEIN: 7 g/dL (ref 6.1–8.1)

## 2015-03-07 LAB — HEMOGLOBIN A1C
Hgb A1c MFr Bld: 5.7 % — ABNORMAL HIGH (ref <5.7)
MEAN PLASMA GLUCOSE: 117 mg/dL — AB (ref <117)

## 2015-03-08 LAB — HIV ANTIBODY (ROUTINE TESTING W REFLEX): HIV: NONREACTIVE

## 2015-03-08 LAB — HEPATITIS C ANTIBODY: HCV Ab: NEGATIVE

## 2015-03-10 ENCOUNTER — Ambulatory Visit (INDEPENDENT_AMBULATORY_CARE_PROVIDER_SITE_OTHER): Payer: Medicare Other | Admitting: Family Medicine

## 2015-03-10 ENCOUNTER — Encounter: Payer: Self-pay | Admitting: Family Medicine

## 2015-03-10 VITALS — BP 130/82 | HR 61 | Resp 16 | Ht 69.25 in | Wt 159.1 lb

## 2015-03-10 DIAGNOSIS — Z0000 Encounter for general adult medical examination without abnormal findings: Secondary | ICD-10-CM

## 2015-03-10 DIAGNOSIS — F17210 Nicotine dependence, cigarettes, uncomplicated: Secondary | ICD-10-CM

## 2015-03-10 DIAGNOSIS — R7302 Impaired glucose tolerance (oral): Secondary | ICD-10-CM

## 2015-03-10 DIAGNOSIS — I10 Essential (primary) hypertension: Secondary | ICD-10-CM

## 2015-03-10 DIAGNOSIS — Z23 Encounter for immunization: Secondary | ICD-10-CM

## 2015-03-10 NOTE — Assessment & Plan Note (Signed)

## 2015-03-10 NOTE — Addendum Note (Signed)
Addended by: Denman George B on: 03/10/2015 08:59 AM   Modules accepted: Orders

## 2015-03-10 NOTE — Progress Notes (Signed)
Preventive Screening-Counseling & Management   Patient present here today for a Medicare annual wellness visit.   Current Problems (verified)   Medications Prior to Visit Allergies (verified)   PAST HISTORY  Family History (verified)   Social History  Single, no children, current smoker, has cut back down to 4 per day. Quit date set for 06/12/2015. Disabled since [place] [facility]  Current exercise habits:  Occasionally, he does walk a lot but no current routine   Dietary issues discussed: Encouraged low fat, heart healthy rich in fruits and vegetables    Cardiac risk factors: nicotine use   Depression Screen  (Note: if answer to either of the following is "Yes", a more complete depression screening is indicated)   Over the past two weeks, have you felt down, depressed or hopeless? No  Over the past two weeks, have you felt little interest or pleasure in doing things? No  Have you lost interest or pleasure in daily life? No  Do you often feel hopeless? No  Do you cry easily over simple problems? No   Activities of Daily Living  In your present state of health, do you have any difficulty performing the following activities?  Driving?: doesn't drive, either friend or sibling transports him Managing money?: No Feeding yourself?:No Getting from bed to chair?:No Climbing a flight of stairs?:No Preparing food and eating?:No Bathing or showering?:No Getting dressed?:No Getting to the toilet?:No Using the toilet?:No Moving around from place to place?: No  Fall Risk Assessment In the past year have you fallen or had a near fall?:No Are you currently taking any medications that make you dizzy?   Hearing Difficulties: No Do you often ask people to speak up or repeat themselves?:No Do you experience ringing or noises in your ears?:No Do you have difficulty understanding soft or whispered voices?:No  Cognitive Testing  Alert? Yes Normal Appearance?Yes  Oriented to  person? Yes Place? Yes  Time? Yes  Displays appropriate judgment?Yes  Can read the correct time from a watch face? yes Are you having problems remembering things?No  Advanced Directives have been discussed with the patient?Yes, brochure given  , , full code   List the Names of Other Physician/Practitioners you currently use:  Dr Jeffie Pollock (urology)   Indicate any recent Medical Services you may have received from other than [facility] providers in the past year (date may be approximate).   Assessment:    Annual Wellness Exam   Plan:    Medicare Attestation  I have personally reviewed:  The patient's medical and social history  Their use of alcohol, tobacco or illicit drugs  Their current medications and supplements  The patient's functional ability including ADLs,fall risks, home safety risks, cognitive, and hearing and visual impairment  Diet and physical activities  Evidence for depression or mood disorders  The patient's weight, height, BMI, and visual acuity have been recorded in the chart. I have made referrals, counseling, and provided education to the patient based on review of the above and I have provided the patient with a written personalized care plan for preventive services.   BP 130/82 mmHg  Pulse 61  Resp 16  Ht 5' 9.25" (1.759 m)  Wt 159 lb 1.9 oz (72.176 kg)  BMI 23.33 kg/m2  SpO2 100%   Medicare annual wellness visit, subsequent Annual exam as documented. Counseling done  re healthy lifestyle involving commitment to 150 minutes exercise per week, heart healthy diet, and attaining healthy weight.The importance of adequate sleep also  discussed. Regular seat belt use and home safety, is also discussed. Changes in health habits are decided on by the patient with goals and time frames  set for achieving them. Immunization and cancer screening needs are specifically addressed at this visit.   Need for prophylactic vaccination and inoculation against influenza After  obtaining informed consent, the vaccine is  administered by LPN.   NICOTINE ADDICTION Patient counseled for approximately 5 minutes regarding the health risks of ongoing nicotine use, specifically all types of cancer, heart disease, stroke and respiratory failure. The options available for help with cessation ,the behavioral changes to assist the process, and the option to either gradully reduce usage  Or abruptly stop.is also discussed. Pt is also encouraged to set specific goals in number of cigarettes used daily, as well as to set a quit date.  Number of cigarettes/cigars currently smoking daily: 4  Will quit 06/12/2015

## 2015-03-10 NOTE — Patient Instructions (Addendum)
F/u in 5 month, call if you need me before  Flu vaccine today  Quit date for cigarettes is 06/12/2015  Fasting labs to be drawn  Thanks for choosing [facility], we consider it a privelige to serve you.  All the best

## 2015-03-10 NOTE — Assessment & Plan Note (Signed)

## 2015-03-10 NOTE — Assessment & Plan Note (Signed)
After obtaining informed consent, the vaccine is  administered by LPN.  

## 2015-06-04 ENCOUNTER — Other Ambulatory Visit: Payer: Self-pay | Admitting: Family Medicine

## 2015-07-28 DIAGNOSIS — R972 Elevated prostate specific antigen [PSA]: Secondary | ICD-10-CM | POA: Diagnosis not present

## 2015-07-30 ENCOUNTER — Ambulatory Visit (INDEPENDENT_AMBULATORY_CARE_PROVIDER_SITE_OTHER): Payer: Medicare Other | Admitting: Urology

## 2015-07-30 DIAGNOSIS — N402 Nodular prostate without lower urinary tract symptoms: Secondary | ICD-10-CM | POA: Diagnosis not present

## 2015-07-30 DIAGNOSIS — R972 Elevated prostate specific antigen [PSA]: Secondary | ICD-10-CM

## 2015-07-30 DIAGNOSIS — N5201 Erectile dysfunction due to arterial insufficiency: Secondary | ICD-10-CM

## 2015-08-06 ENCOUNTER — Telehealth: Payer: Self-pay | Admitting: Family Medicine

## 2015-08-06 DIAGNOSIS — I10 Essential (primary) hypertension: Secondary | ICD-10-CM

## 2015-08-06 DIAGNOSIS — R7302 Impaired glucose tolerance (oral): Secondary | ICD-10-CM

## 2015-08-06 NOTE — Telephone Encounter (Signed)
Craig Shaffer is scheduled to see Dr. Moshe Cipro on Tues Feb 28th and he has questions about getting labs done, please advise?

## 2015-08-06 NOTE — Telephone Encounter (Signed)
Needs HBA1C and chem 7 non fast

## 2015-08-06 NOTE — Addendum Note (Signed)
Addended by: Denman George B on: 08/06/2015 02:52 PM   Modules accepted: Orders

## 2015-08-06 NOTE — Telephone Encounter (Signed)
Patient aware and states that he will go on 2/25.  Labs ordered and faxed.

## 2015-08-09 DIAGNOSIS — R7302 Impaired glucose tolerance (oral): Secondary | ICD-10-CM | POA: Diagnosis not present

## 2015-08-09 DIAGNOSIS — I10 Essential (primary) hypertension: Secondary | ICD-10-CM | POA: Diagnosis not present

## 2015-08-09 LAB — BASIC METABOLIC PANEL
BUN: 13 mg/dL (ref 7–25)
CHLORIDE: 103 mmol/L (ref 98–110)
CO2: 27 mmol/L (ref 20–31)
CREATININE: 1.13 mg/dL (ref 0.70–1.33)
Calcium: 8.8 mg/dL (ref 8.6–10.3)
Glucose, Bld: 87 mg/dL (ref 65–99)
Potassium: 4.2 mmol/L (ref 3.5–5.3)
Sodium: 139 mmol/L (ref 135–146)

## 2015-08-10 ENCOUNTER — Ambulatory Visit (INDEPENDENT_AMBULATORY_CARE_PROVIDER_SITE_OTHER): Payer: Medicare Other | Admitting: Family Medicine

## 2015-08-10 ENCOUNTER — Encounter: Payer: Self-pay | Admitting: Family Medicine

## 2015-08-10 VITALS — BP 142/84 | HR 71 | Resp 16 | Ht 69.0 in | Wt 163.4 lb

## 2015-08-10 DIAGNOSIS — I10 Essential (primary) hypertension: Secondary | ICD-10-CM | POA: Diagnosis not present

## 2015-08-10 DIAGNOSIS — Z13220 Encounter for screening for lipoid disorders: Secondary | ICD-10-CM | POA: Diagnosis not present

## 2015-08-10 DIAGNOSIS — F17200 Nicotine dependence, unspecified, uncomplicated: Secondary | ICD-10-CM

## 2015-08-10 DIAGNOSIS — R972 Elevated prostate specific antigen [PSA]: Secondary | ICD-10-CM

## 2015-08-10 LAB — HEMOGLOBIN A1C
HEMOGLOBIN A1C: 5.7 % — AB (ref <5.7)
Mean Plasma Glucose: 117 mg/dL — ABNORMAL HIGH (ref <117)

## 2015-08-10 MED ORDER — DILTIAZEM HCL ER BEADS 360 MG PO CP24: ORAL_CAPSULE | ORAL | Status: DC

## 2015-08-10 NOTE — Patient Instructions (Addendum)
Annual physical exam Sept 30 or after, call if you need me sooner  Cut back on salt and sodas , ham, nacon and hot dogs, you will lose weight , lower blood pressure and be healthier  BP is too high today  Set a quit date and work at this, you can do this  Fasting labs in September  You Can Quit Smoking If you are ready to quit smoking or are thinking about it, congratulations! You have chosen to help yourself be healthier and live longer! There are lots of different ways to quit smoking. Nicotine gum, nicotine patches, a nicotine inhaler, or nicotine nasal spray can help with physical craving. Hypnosis, support groups, and medicines help break the habit of smoking. TIPS TO GET OFF AND STAY OFF CIGARETTES  Learn to predict your moods. Do not let a bad situation be your excuse to have a cigarette. Some situations in your life might tempt you to have a cigarette.  Ask friends and co-workers not to smoke around you.  Make your home smoke-free.  Never have "just one" cigarette. It leads to wanting another and another. Remind yourself of your decision to quit.  On a card, make a list of your reasons for not smoking. Read it at least the same number of times a day as you have a cigarette. Tell yourself everyday, "I do not want to smoke. I choose not to smoke."  Ask someone at home or work to help you with your plan to quit smoking.  Have something planned after you eat or have a cup of coffee. Take a walk or get other exercise to perk you up. This will help to keep you from overeating.  Try a relaxation exercise to calm you down and decrease your stress. Remember, you may be tense and nervous the first two weeks after you quit. This will pass.  Find new activities to keep your hands busy. Play with a pen, coin, or rubber band. Doodle or draw things on paper.  Brush your teeth right after eating. This will help cut down the craving for the taste of tobacco after meals. You can try mouthwash  too.  Try gum, breath mints, or diet candy to keep something in your mouth. IF YOU SMOKE AND WANT TO QUIT:  Do not stock up on cigarettes. Never buy a carton. Wait until one pack is finished before you buy another.  Never carry cigarettes with you at work or at home.  Keep cigarettes as far away from you as possible. Leave them with someone else.  Never carry matches or a lighter with you.  Ask yourself, "Do I need this cigarette or is this just a reflex?"  Bet with someone that you can quit. Put cigarette money in a piggy bank every morning. If you smoke, you give up the money. If you do not smoke, by the end of the week, you keep the money.  Keep trying. It takes 21 days to change a habit!  Talk to your doctor about using medicines to help you quit. These include nicotine replacement gum, lozenges, or skin patches.   This information is not intended to replace advice given to you by your health care provider. Make sure you discuss any questions you have with your health care provider.   Document Released: 03/25/2009 Document Revised: 08/21/2011 Document Reviewed: 03/25/2009 Elsevier Interactive Patient Education [employer].

## 2015-08-10 NOTE — Assessment & Plan Note (Signed)

## 2015-08-10 NOTE — Addendum Note (Signed)
Addended by: Eual Fines on: 08/10/2015 08:41 AM   Modules accepted: Orders

## 2015-08-10 NOTE — Assessment & Plan Note (Signed)
Unchanged, no change in exam

## 2015-08-10 NOTE — Progress Notes (Signed)
   Subjective:    Patient ID: Craig Shaffer, male    DOB: Apr 10, 1961, 55 y.o.   MRN: [id number]  HPI   NIM GELWICKS     MRN: [id number]      DOB: May 29, 1961   HPI Mr. Rashid is here for follow up and re-evaluation of chronic medical conditions, medication management and review of any available recent lab and radiology data.  Preventive health is updated, specifically  Cancer screening and Immunization.   Questions or concerns regarding consultations or procedures which the PT has had in the interim are  addressed. The PT denies any adverse reactions to current medications since the last visit.  There are no new concerns.  There are no specific complaints   ROS Denies recent fever or chills. Denies sinus pressure, nasal congestion, ear pain or sore throat. Denies chest congestion, productive cough or wheezing. Denies chest pains, palpitations and leg swelling Denies abdominal pain, nausea, vomiting,diarrhea or constipation.   Denies dysuria, frequency, hesitancy or incontinence. Denies joint pain, swelling and limitation in mobility. Denies headaches, seizures, numbness, or tingling. Denies depression, anxiety or insomnia. Denies skin break down or rash.   PE  BP 142/84 mmHg  Pulse 71  Resp 16  Ht 5\' 9"  (1.753 m)  Wt 163 lb 6.4 oz (74.118 kg)  BMI 24.12 kg/m2  SpO2 99%  Patient alert and oriented and in no cardiopulmonary distress.  HEENT: No facial asymmetry, EOMI,   oropharynx pink and moist.  Neck supple no JVD, no mass.  Chest: Clear to auscultation bilaterally.  CVS: S1, S2 no murmurs, no S3.Regular rate.  ABD: Soft non tender.   Ext: No edema  MS: Adequate ROM spine, shoulders, hips and knees.  Skin: Intact, no ulcerations or rash noted.  Psych: Good eye contact, normal affect. Memory intact not anxious or depressed appearing.  CNS: CN 2-12 intact, power,  normal throughout.no focal deficits noted.   Assessment & Plan   NICOTINE  ADDICTION Patient counseled for approximately 5 minutes regarding the health risks of ongoing nicotine use, specifically all types of cancer, heart disease, stroke and respiratory failure. The options available for help with cessation ,the behavioral changes to assist the process, and the option to either gradully reduce usage  Or abruptly stop.is also discussed. Pt is also encouraged to set specific goals in number of cigarettes used daily, as well as to set a quit date.  Number of cigarettes/cigars currently smoking daily: 4   Essential hypertension Uncontrolled, no med change DASH diet and commitment to daily physical activity for a minimum of 30 minutes discussed and encouraged, as a part of hypertension management. The importance of attaining a healthy weight is also discussed.  BP/Weight 08/10/2015 03/10/2015 10/06/2014 03/16/2014 10/03/2013 04/04/2013 [id number]  Systolic BP [id number]  Diastolic BP 84 82 80 80 82 84 84  Wt. (Lbs) 163.4 159.12 166 165 161 164.4 169  BMI 24.12 23.33 24.5 24.36 23.76 24.27 24.95        OTHER DEMYELINATING DISEASES OF CNTRL NERV SYS Unchanged, no change in exam  ELEVATED PROSTATE SPECIFIC ANTIGEN Followed by urology has upcoming appointment, currently on a 6 month schedule per pt report       Review of Systems     Objective:   Physical Exam        Assessment & Plan:

## 2015-08-10 NOTE — Assessment & Plan Note (Signed)
Followed by urology has upcoming appointment, currently on a 6 month schedule per pt report

## 2015-08-10 NOTE — Assessment & Plan Note (Signed)
Uncontrolled, no med change DASH diet and commitment to daily physical activity for a minimum of 30 minutes discussed and encouraged, as a part of hypertension management. The importance of attaining a healthy weight is also discussed.  BP/Weight 08/10/2015 03/10/2015 10/06/2014 03/16/2014 10/03/2013 04/04/2013 [id number]  Systolic BP [id number]  Diastolic BP 84 82 80 80 82 84 84  Wt. (Lbs) 163.4 159.12 166 165 161 164.4 169  BMI 24.12 23.33 24.5 24.36 23.76 24.27 24.95

## 2015-09-04 ENCOUNTER — Other Ambulatory Visit: Payer: Self-pay | Admitting: Family Medicine

## 2015-12-23 ENCOUNTER — Telehealth: Payer: Self-pay | Admitting: Family Medicine

## 2015-12-23 NOTE — Telephone Encounter (Signed)
Gary is calling asking if Dr. Moshe Cipro would call him in something for a sorethroat, nothing OTC has helped yet, please advise?

## 2015-12-24 NOTE — Telephone Encounter (Signed)
Called and left message for patient to return call.  

## 2015-12-27 NOTE — Telephone Encounter (Signed)
Called and left message for patient to return call.  

## 2016-01-28 ENCOUNTER — Ambulatory Visit: Payer: Medicare Other | Admitting: Urology

## 2016-03-08 DIAGNOSIS — R972 Elevated prostate specific antigen [PSA]: Secondary | ICD-10-CM | POA: Diagnosis not present

## 2016-03-10 ENCOUNTER — Ambulatory Visit (INDEPENDENT_AMBULATORY_CARE_PROVIDER_SITE_OTHER): Payer: Medicare Other | Admitting: Urology

## 2016-03-10 DIAGNOSIS — R972 Elevated prostate specific antigen [PSA]: Secondary | ICD-10-CM | POA: Diagnosis not present

## 2016-03-10 DIAGNOSIS — N5201 Erectile dysfunction due to arterial insufficiency: Secondary | ICD-10-CM | POA: Diagnosis not present

## 2016-03-13 ENCOUNTER — Encounter: Payer: Medicare Other | Admitting: Family Medicine

## 2016-04-12 HISTORY — PX: APPENDECTOMY: SHX54

## 2016-05-10 ENCOUNTER — Encounter (HOSPITAL_COMMUNITY): Payer: Self-pay | Admitting: *Deleted

## 2016-05-10 DIAGNOSIS — G378 Other specified demyelinating diseases of central nervous system: Secondary | ICD-10-CM | POA: Insufficient documentation

## 2016-05-10 DIAGNOSIS — K353 Acute appendicitis with localized peritonitis: Secondary | ICD-10-CM | POA: Diagnosis present

## 2016-05-10 DIAGNOSIS — N529 Male erectile dysfunction, unspecified: Secondary | ICD-10-CM | POA: Insufficient documentation

## 2016-05-10 DIAGNOSIS — R1084 Generalized abdominal pain: Secondary | ICD-10-CM | POA: Diagnosis not present

## 2016-05-10 DIAGNOSIS — Z7982 Long term (current) use of aspirin: Secondary | ICD-10-CM | POA: Diagnosis not present

## 2016-05-10 DIAGNOSIS — R1031 Right lower quadrant pain: Secondary | ICD-10-CM | POA: Diagnosis not present

## 2016-05-10 DIAGNOSIS — N400 Enlarged prostate without lower urinary tract symptoms: Secondary | ICD-10-CM | POA: Diagnosis not present

## 2016-05-10 DIAGNOSIS — F17210 Nicotine dependence, cigarettes, uncomplicated: Secondary | ICD-10-CM | POA: Insufficient documentation

## 2016-05-10 DIAGNOSIS — I10 Essential (primary) hypertension: Secondary | ICD-10-CM | POA: Diagnosis not present

## 2016-05-10 DIAGNOSIS — Z800 Family history of malignant neoplasm of digestive organs: Secondary | ICD-10-CM | POA: Diagnosis not present

## 2016-05-10 LAB — COMPREHENSIVE METABOLIC PANEL
ALK PHOS: 69 U/L (ref 38–126)
ALT: 13 U/L — AB (ref 17–63)
AST: 17 U/L (ref 15–41)
Albumin: 3.9 g/dL (ref 3.5–5.0)
Anion gap: 7 (ref 5–15)
BUN: 9 mg/dL (ref 6–20)
CALCIUM: 9 mg/dL (ref 8.9–10.3)
CO2: 29 mmol/L (ref 22–32)
CREATININE: 0.98 mg/dL (ref 0.61–1.24)
Chloride: 104 mmol/L (ref 101–111)
Glucose, Bld: 111 mg/dL — ABNORMAL HIGH (ref 65–99)
Potassium: 3.4 mmol/L — ABNORMAL LOW (ref 3.5–5.1)
Sodium: 140 mmol/L (ref 135–145)
Total Bilirubin: 0.4 mg/dL (ref 0.3–1.2)
Total Protein: 7.8 g/dL (ref 6.5–8.1)

## 2016-05-10 LAB — CBC WITH DIFFERENTIAL/PLATELET
BASOS ABS: 0 10*3/uL (ref 0.0–0.1)
Basophils Relative: 0 %
Eosinophils Absolute: 0 10*3/uL (ref 0.0–0.7)
Eosinophils Relative: 0 %
HEMATOCRIT: 39.2 % (ref 39.0–52.0)
HEMOGLOBIN: 13.3 g/dL (ref 13.0–17.0)
LYMPHS ABS: 1.2 10*3/uL (ref 0.7–4.0)
LYMPHS PCT: 8 %
MCH: 29.8 pg (ref 26.0–34.0)
MCHC: 33.9 g/dL (ref 30.0–36.0)
MCV: 87.9 fL (ref 78.0–100.0)
Monocytes Absolute: 1.5 10*3/uL — ABNORMAL HIGH (ref 0.1–1.0)
Monocytes Relative: 10 %
NEUTROS ABS: 12.2 10*3/uL — AB (ref 1.7–7.7)
NEUTROS PCT: 82 %
Platelets: 210 10*3/uL (ref 150–400)
RBC: 4.46 MIL/uL (ref 4.22–5.81)
RDW: 14.1 % (ref 11.5–15.5)
WBC: 14.9 10*3/uL — AB (ref 4.0–10.5)

## 2016-05-10 LAB — LIPASE, BLOOD: LIPASE: 18 U/L (ref 11–51)

## 2016-05-10 NOTE — ED Triage Notes (Signed)
Pt c/o generalized abdominal pain that started this am; pt denies any n/v/d

## 2016-05-11 ENCOUNTER — Encounter (HOSPITAL_COMMUNITY): Payer: Self-pay | Admitting: Unknown Physician Specialty

## 2016-05-11 ENCOUNTER — Emergency Department (HOSPITAL_COMMUNITY): Payer: Medicare Other

## 2016-05-11 ENCOUNTER — Observation Stay (HOSPITAL_COMMUNITY): Payer: Medicare Other | Admitting: Anesthesiology

## 2016-05-11 ENCOUNTER — Observation Stay (HOSPITAL_COMMUNITY)
Admission: EM | Admit: 2016-05-11 | Discharge: 2016-05-12 | Disposition: A | Discharge status: Home / Self Care | Payer: Medicare Other | Attending: General Surgery | Admitting: General Surgery

## 2016-05-11 ENCOUNTER — Encounter (HOSPITAL_COMMUNITY)
Admission: EM | Disposition: A | Discharge status: Home / Self Care | Payer: Self-pay | Source: Home / Self Care | Attending: Emergency Medicine

## 2016-05-11 DIAGNOSIS — K3580 Unspecified acute appendicitis: Secondary | ICD-10-CM | POA: Diagnosis not present

## 2016-05-11 DIAGNOSIS — F17210 Nicotine dependence, cigarettes, uncomplicated: Secondary | ICD-10-CM | POA: Diagnosis not present

## 2016-05-11 DIAGNOSIS — K353 Acute appendicitis with localized peritonitis, without perforation or gangrene: Secondary | ICD-10-CM

## 2016-05-11 DIAGNOSIS — N400 Enlarged prostate without lower urinary tract symptoms: Secondary | ICD-10-CM | POA: Diagnosis not present

## 2016-05-11 DIAGNOSIS — R1084 Generalized abdominal pain: Secondary | ICD-10-CM | POA: Diagnosis not present

## 2016-05-11 DIAGNOSIS — Z800 Family history of malignant neoplasm of digestive organs: Secondary | ICD-10-CM | POA: Diagnosis not present

## 2016-05-11 DIAGNOSIS — R1031 Right lower quadrant pain: Secondary | ICD-10-CM | POA: Diagnosis not present

## 2016-05-11 DIAGNOSIS — I10 Essential (primary) hypertension: Secondary | ICD-10-CM

## 2016-05-11 DIAGNOSIS — G378 Other specified demyelinating diseases of central nervous system: Secondary | ICD-10-CM | POA: Diagnosis not present

## 2016-05-11 DIAGNOSIS — N529 Male erectile dysfunction, unspecified: Secondary | ICD-10-CM | POA: Diagnosis not present

## 2016-05-11 DIAGNOSIS — Z7982 Long term (current) use of aspirin: Secondary | ICD-10-CM | POA: Diagnosis not present

## 2016-05-11 HISTORY — PX: LAPAROSCOPIC APPENDECTOMY: SHX408

## 2016-05-11 LAB — URINALYSIS, ROUTINE W REFLEX MICROSCOPIC
BILIRUBIN URINE: NEGATIVE
GLUCOSE, UA: NEGATIVE mg/dL
HGB URINE DIPSTICK: NEGATIVE
KETONES UR: NEGATIVE mg/dL
Leukocytes, UA: NEGATIVE
Nitrite: NEGATIVE
PH: 7 (ref 5.0–8.0)
PROTEIN: NEGATIVE mg/dL
Specific Gravity, Urine: <1.005 — ABNORMAL LOW (ref 1.005–1.030)

## 2016-05-11 LAB — SURGICAL PCR SCREEN
MRSA, PCR: NEGATIVE
STAPHYLOCOCCUS AUREUS: POSITIVE — AB

## 2016-05-11 SURGERY — APPENDECTOMY, LAPAROSCOPIC
Anesthesia: General

## 2016-05-11 MED ORDER — ONDANSETRON HCL 4 MG/2ML IJ SOLN
4.0000 mg | Freq: Once | INTRAMUSCULAR | Status: AC
  Administered 2016-05-11: 4 mg via INTRAVENOUS
  Filled 2016-05-11: qty 2

## 2016-05-11 MED ORDER — ENOXAPARIN SODIUM 40 MG/0.4ML ~~LOC~~ SOLN
40.0000 mg | SUBCUTANEOUS | Status: DC
  Administered 2016-05-12: 40 mg via SUBCUTANEOUS
  Filled 2016-05-11: qty 0.4

## 2016-05-11 MED ORDER — PIPERACILLIN-TAZOBACTAM 3.375 G IVPB
3.3750 g | Freq: Three times a day (TID) | INTRAVENOUS | Status: DC
  Administered 2016-05-11 – 2016-05-12 (×3): 3.375 g via INTRAVENOUS
  Filled 2016-05-11 (×2): qty 50

## 2016-05-11 MED ORDER — LACTATED RINGERS IV SOLN
INTRAVENOUS | Status: DC
  Administered 2016-05-11 (×2): via INTRAVENOUS

## 2016-05-11 MED ORDER — BUPIVACAINE HCL (PF) 0.5 % IJ SOLN
INTRAMUSCULAR | Status: AC
  Filled 2016-05-11: qty 30

## 2016-05-11 MED ORDER — KETOROLAC TROMETHAMINE 30 MG/ML IJ SOLN
30.0000 mg | Freq: Once | INTRAMUSCULAR | Status: AC
  Administered 2016-05-11: 30 mg via INTRAVENOUS
  Filled 2016-05-11: qty 1

## 2016-05-11 MED ORDER — MIDAZOLAM HCL 2 MG/2ML IJ SOLN
INTRAMUSCULAR | Status: AC
  Filled 2016-05-11: qty 2

## 2016-05-11 MED ORDER — FENTANYL CITRATE (PF) 100 MCG/2ML IJ SOLN
INTRAMUSCULAR | Status: DC | PRN
  Administered 2016-05-11: 50 ug via INTRAVENOUS
  Administered 2016-05-11: 100 ug via INTRAVENOUS

## 2016-05-11 MED ORDER — FENTANYL CITRATE (PF) 250 MCG/5ML IJ SOLN
INTRAMUSCULAR | Status: AC
  Filled 2016-05-11: qty 5

## 2016-05-11 MED ORDER — LORAZEPAM 2 MG/ML IJ SOLN: 1.0000 mg | INTRAMUSCULAR | Status: DC | PRN

## 2016-05-11 MED ORDER — PROPOFOL 10 MG/ML IV BOLUS
INTRAVENOUS | Status: AC
  Filled 2016-05-11: qty 20

## 2016-05-11 MED ORDER — MIDAZOLAM HCL 5 MG/5ML IJ SOLN
INTRAMUSCULAR | Status: DC | PRN
  Administered 2016-05-11: 2 mg via INTRAVENOUS

## 2016-05-11 MED ORDER — POVIDONE-IODINE 10 % EX OINT
TOPICAL_OINTMENT | CUTANEOUS | Status: AC
  Filled 2016-05-11: qty 1

## 2016-05-11 MED ORDER — SODIUM CHLORIDE 0.9 % IR SOLN
Status: DC | PRN
  Administered 2016-05-11: 1000 mL

## 2016-05-11 MED ORDER — ONDANSETRON HCL 4 MG/2ML IJ SOLN
INTRAMUSCULAR | Status: AC
  Filled 2016-05-11: qty 2

## 2016-05-11 MED ORDER — POVIDONE-IODINE 10 % OINT PACKET
TOPICAL_OINTMENT | CUTANEOUS | Status: DC | PRN
  Administered 2016-05-11: 1 via TOPICAL

## 2016-05-11 MED ORDER — ACETAMINOPHEN 650 MG RE SUPP: 650.0000 mg | Freq: Four times a day (QID) | RECTAL | Status: DC | PRN

## 2016-05-11 MED ORDER — ROCURONIUM BROMIDE 100 MG/10ML IV SOLN
INTRAVENOUS | Status: DC | PRN
  Administered 2016-05-11: 5 mg via INTRAVENOUS
  Administered 2016-05-11: 20 mg via INTRAVENOUS

## 2016-05-11 MED ORDER — SODIUM CHLORIDE 0.9 % IV BOLUS (SEPSIS)
1000.0000 mL | Freq: Once | INTRAVENOUS | Status: AC
  Administered 2016-05-11: 1000 mL via INTRAVENOUS

## 2016-05-11 MED ORDER — HYDROMORPHONE HCL 1 MG/ML IJ SOLN: 1.0000 mg | INTRAMUSCULAR | Status: DC | PRN

## 2016-05-11 MED ORDER — BUPIVACAINE HCL (PF) 0.5 % IJ SOLN
INTRAMUSCULAR | Status: DC | PRN
  Administered 2016-05-11: 10 mL

## 2016-05-11 MED ORDER — NICOTINE 21 MG/24HR TD PT24
21.0000 mg | MEDICATED_PATCH | Freq: Every day | TRANSDERMAL | Status: DC
  Administered 2016-05-11 – 2016-05-12 (×2): 21 mg via TRANSDERMAL
  Filled 2016-05-11 (×2): qty 1

## 2016-05-11 MED ORDER — FENTANYL CITRATE (PF) 100 MCG/2ML IJ SOLN: 25.0000 ug | INTRAMUSCULAR | Status: DC | PRN

## 2016-05-11 MED ORDER — SODIUM CHLORIDE 0.9 % IV BOLUS (SEPSIS)
500.0000 mL | Freq: Once | INTRAVENOUS | Status: AC
  Administered 2016-05-11: 500 mL via INTRAVENOUS

## 2016-05-11 MED ORDER — ONDANSETRON HCL 4 MG/2ML IJ SOLN
4.0000 mg | Freq: Once | INTRAMUSCULAR | Status: AC
  Administered 2016-05-11: 4 mg via INTRAVENOUS

## 2016-05-11 MED ORDER — ACETAMINOPHEN 325 MG PO TABS
650.0000 mg | ORAL_TABLET | Freq: Four times a day (QID) | ORAL | Status: DC | PRN
  Administered 2016-05-11 – 2016-05-12 (×2): 650 mg via ORAL
  Filled 2016-05-11 (×2): qty 2

## 2016-05-11 MED ORDER — ONDANSETRON HCL 4 MG/2ML IJ SOLN: 4.0000 mg | Freq: Four times a day (QID) | INTRAMUSCULAR | Status: DC | PRN

## 2016-05-11 MED ORDER — OXYCODONE HCL 5 MG PO TABS
5.0000 mg | ORAL_TABLET | ORAL | Status: DC | PRN
  Administered 2016-05-11: 5 mg via ORAL
  Filled 2016-05-11: qty 1

## 2016-05-11 MED ORDER — PROPOFOL 10 MG/ML IV BOLUS
INTRAVENOUS | Status: DC | PRN
  Administered 2016-05-11: 150 mg via INTRAVENOUS

## 2016-05-11 MED ORDER — NEOSTIGMINE METHYLSULFATE 10 MG/10ML IV SOLN
INTRAVENOUS | Status: DC | PRN
  Administered 2016-05-11 (×2): 2 mg via INTRAVENOUS

## 2016-05-11 MED ORDER — DIPHENHYDRAMINE HCL 25 MG PO CAPS: 25.0000 mg | ORAL_CAPSULE | Freq: Four times a day (QID) | ORAL | Status: DC | PRN

## 2016-05-11 MED ORDER — SUCCINYLCHOLINE CHLORIDE 20 MG/ML IJ SOLN
INTRAMUSCULAR | Status: DC | PRN
Start: 2016-05-11 — End: 2016-05-11
  Administered 2016-05-11: 150 mg via INTRAVENOUS

## 2016-05-11 MED ORDER — LACTATED RINGERS IV SOLN: INTRAVENOUS | Status: DC

## 2016-05-11 MED ORDER — PIPERACILLIN-TAZOBACTAM 3.375 G IVPB 30 MIN
3.3750 g | Freq: Once | INTRAVENOUS | Status: AC
  Administered 2016-05-11: 3.375 g via INTRAVENOUS
  Filled 2016-05-11: qty 50

## 2016-05-11 MED ORDER — METRONIDAZOLE 500 MG PO TABS
500.0000 mg | ORAL_TABLET | Freq: Three times a day (TID) | ORAL | Status: DC
  Administered 2016-05-11: 500 mg via ORAL
  Filled 2016-05-11: qty 1

## 2016-05-11 MED ORDER — DIPHENHYDRAMINE HCL 50 MG/ML IJ SOLN: 25.0000 mg | Freq: Four times a day (QID) | INTRAMUSCULAR | Status: DC | PRN

## 2016-05-11 MED ORDER — LIDOCAINE HCL 1 % IJ SOLN
INTRAMUSCULAR | Status: DC | PRN
  Administered 2016-05-11: 25 mg via INTRADERMAL

## 2016-05-11 MED ORDER — IOPAMIDOL (ISOVUE-300) INJECTION 61%
100.0000 mL | Freq: Once | INTRAVENOUS | Status: AC | PRN
  Administered 2016-05-11: 30 mL via ORAL
  Administered 2016-05-11: 100 mL via INTRAVENOUS

## 2016-05-11 MED ORDER — MUPIROCIN 2 % EX OINT
1.0000 "application " | TOPICAL_OINTMENT | Freq: Two times a day (BID) | CUTANEOUS | Status: DC
  Administered 2016-05-11 – 2016-05-12 (×3): 1 via NASAL
  Filled 2016-05-11: qty 22

## 2016-05-11 MED ORDER — LIDOCAINE HCL (PF) 1 % IJ SOLN
INTRAMUSCULAR | Status: AC
  Filled 2016-05-11: qty 5

## 2016-05-11 MED ORDER — MIDAZOLAM HCL 2 MG/2ML IJ SOLN
1.0000 mg | INTRAMUSCULAR | Status: DC | PRN
  Administered 2016-05-11: 2 mg via INTRAVENOUS

## 2016-05-11 MED ORDER — ONDANSETRON 4 MG PO TBDP
4.0000 mg | ORAL_TABLET | Freq: Four times a day (QID) | ORAL | Status: DC | PRN
Start: 2016-05-11 — End: 2016-05-12
  Filled 2016-05-11: qty 1

## 2016-05-11 MED ORDER — CHLORHEXIDINE GLUCONATE CLOTH 2 % EX PADS
6.0000 | MEDICATED_PAD | Freq: Every day | CUTANEOUS | Status: DC
  Administered 2016-05-11 – 2016-05-12 (×2): 6 via TOPICAL

## 2016-05-11 MED ORDER — IOPAMIDOL (ISOVUE-300) INJECTION 61%
INTRAVENOUS | Status: AC
  Administered 2016-05-11: 30 mL via ORAL
  Filled 2016-05-11: qty 30

## 2016-05-11 MED ORDER — DILTIAZEM HCL ER BEADS 240 MG PO CP24
360.0000 mg | ORAL_CAPSULE | Freq: Every day | ORAL | Status: DC
  Administered 2016-05-11 – 2016-05-12 (×2): 360 mg via ORAL
  Filled 2016-05-11 (×7): qty 1

## 2016-05-11 MED ORDER — SIMETHICONE 80 MG PO CHEW
40.0000 mg | CHEWABLE_TABLET | Freq: Four times a day (QID) | ORAL | Status: DC | PRN
  Administered 2016-05-12: 40 mg via ORAL
  Filled 2016-05-11: qty 1

## 2016-05-11 MED ORDER — SUCCINYLCHOLINE CHLORIDE 20 MG/ML IJ SOLN
INTRAMUSCULAR | Status: AC
  Filled 2016-05-11: qty 1

## 2016-05-11 MED ORDER — LACTATED RINGERS IV SOLN
INTRAVENOUS | Status: DC
  Administered 2016-05-11 (×2): via INTRAVENOUS

## 2016-05-11 MED ORDER — ROCURONIUM BROMIDE 50 MG/5ML IV SOLN
INTRAVENOUS | Status: AC
  Filled 2016-05-11: qty 1

## 2016-05-11 MED ORDER — GLYCOPYRROLATE 0.2 MG/ML IJ SOLN
INTRAMUSCULAR | Status: DC | PRN
  Administered 2016-05-11: 0.6 mg via INTRAVENOUS

## 2016-05-11 MED ORDER — GLYCOPYRROLATE 0.2 MG/ML IJ SOLN
INTRAMUSCULAR | Status: AC
  Filled 2016-05-11: qty 3

## 2016-05-11 MED ORDER — MORPHINE SULFATE (PF) 4 MG/ML IV SOLN
4.0000 mg | Freq: Once | INTRAVENOUS | Status: AC
  Administered 2016-05-11: 4 mg via INTRAVENOUS
  Filled 2016-05-11: qty 1

## 2016-05-11 SURGICAL SUPPLY — 45 items
BAG HAMPER (MISCELLANEOUS) ×3 IMPLANT
BAG SPEC RTRVL LRG 6X4 10 (ENDOMECHANICALS) ×1
CHLORAPREP W/TINT 26ML (MISCELLANEOUS) ×3 IMPLANT
CLOTH BEACON ORANGE TIMEOUT ST (SAFETY) ×3 IMPLANT
COVER LIGHT HANDLE STERIS (MISCELLANEOUS) ×6 IMPLANT
CUTTER FLEX LINEAR 45M (STAPLE) ×3 IMPLANT
DECANTER SPIKE VIAL GLASS SM (MISCELLANEOUS) ×3 IMPLANT
ELECT REM PT RETURN 9FT ADLT (ELECTROSURGICAL) ×3
ELECTRODE REM PT RTRN 9FT ADLT (ELECTROSURGICAL) ×1 IMPLANT
EVACUATOR SMOKE 8.L (FILTER) ×3 IMPLANT
FORMALIN 10 PREFIL 120ML (MISCELLANEOUS) ×3 IMPLANT
GLOVE BIO SURGEON STRL SZ7 (GLOVE) ×3 IMPLANT
GLOVE BIOGEL PI IND STRL 6.5 (GLOVE) ×1 IMPLANT
GLOVE BIOGEL PI IND STRL 7.0 (GLOVE) ×3 IMPLANT
GLOVE BIOGEL PI INDICATOR 6.5 (GLOVE) ×2
GLOVE BIOGEL PI INDICATOR 7.0 (GLOVE) ×6
GLOVE SURG SS PI 6.5 STRL IVOR (GLOVE) ×3 IMPLANT
GLOVE SURG SS PI 7.5 STRL IVOR (GLOVE) ×3 IMPLANT
GOWN STRL REUS W/ TWL XL LVL3 (GOWN DISPOSABLE) ×1 IMPLANT
GOWN STRL REUS W/TWL LRG LVL3 (GOWN DISPOSABLE) ×6 IMPLANT
GOWN STRL REUS W/TWL XL LVL3 (GOWN DISPOSABLE) ×3
INST SET LAPROSCOPIC AP (KITS) ×3 IMPLANT
KIT ROOM TURNOVER APOR (KITS) ×3 IMPLANT
MANIFOLD NEPTUNE II (INSTRUMENTS) ×3 IMPLANT
NEEDLE INSUFFLATION 14GA 120MM (NEEDLE) ×3 IMPLANT
NS IRRIG 1000ML POUR BTL (IV SOLUTION) ×3 IMPLANT
PACK LAP CHOLE LZT030E (CUSTOM PROCEDURE TRAY) ×3 IMPLANT
PAD ARMBOARD 7.5X6 YLW CONV (MISCELLANEOUS) ×3 IMPLANT
PENCIL HANDSWITCHING (ELECTRODE) ×3 IMPLANT
POUCH SPECIMEN RETRIEVAL 10MM (ENDOMECHANICALS) ×3 IMPLANT
RELOAD 45 VASCULAR/THIN (ENDOMECHANICALS) ×3 IMPLANT
SET BASIN LINEN APH (SET/KITS/TRAYS/PACK) ×3 IMPLANT
SHEARS HARMONIC ACE PLUS 36CM (ENDOMECHANICALS) ×3 IMPLANT
SPONGE GAUZE 2X2 8PLY STER LF (GAUZE/BANDAGES/DRESSINGS) ×3
SPONGE GAUZE 2X2 8PLY STRL LF (GAUZE/BANDAGES/DRESSINGS) ×6 IMPLANT
STAPLER VISISTAT (STAPLE) ×3 IMPLANT
SUT VICRYL 0 UR6 27IN ABS (SUTURE) ×3 IMPLANT
TAPE CLOTH SURG 4X10 WHT LF (GAUZE/BANDAGES/DRESSINGS) ×3 IMPLANT
TRAY FOLEY CATH SILVER 16FR (SET/KITS/TRAYS/PACK) ×3 IMPLANT
TROCAR ENDO BLADELESS 11MM (ENDOMECHANICALS) ×3 IMPLANT
TROCAR ENDO BLADELESS 12MM (ENDOMECHANICALS) ×3 IMPLANT
TROCAR XCEL NON-BLD 5MMX100MML (ENDOMECHANICALS) ×3 IMPLANT
TUBING INSUFFLATION (TUBING) ×3 IMPLANT
WARMER LAPAROSCOPE (MISCELLANEOUS) ×3 IMPLANT
YANKAUER SUCT 12FT TUBE ARGYLE (SUCTIONS) ×3 IMPLANT

## 2016-05-11 NOTE — Anesthesia Preprocedure Evaluation (Signed)
Anesthesia Evaluation  Patient identified by MRN, date of birth, ID band Patient awake    Reviewed: Allergy & Precautions, NPO status , Patient's Chart, lab work & pertinent test results  Airway Mallampati: I  TM Distance: >3 FB Neck ROM: Full    Dental  (+) Edentulous Upper, Edentulous Lower   Pulmonary Current Smoker,    breath sounds clear to auscultation       Cardiovascular hypertension,  Rhythm:Regular Rate:Normal     Neuro/Psych Demyelinating disease of central nervous system affecting bilateral LE's    GI/Hepatic   Endo/Other    Renal/GU      Musculoskeletal   Abdominal   Peds  Hematology   Anesthesia Other Findings   Reproductive/Obstetrics                             Anesthesia Physical Anesthesia Plan  ASA: III  Anesthesia Plan: General   Post-op Pain Management:    Induction: Intravenous, Rapid sequence and Cricoid pressure planned  Airway Management Planned: Oral ETT  Additional Equipment:   Intra-op Plan:   Post-operative Plan: Extubation in OR  Informed Consent: I have reviewed the patients History and Physical, chart, labs and discussed the procedure including the risks, benefits and alternatives for the proposed anesthesia with the patient or authorized representative who has indicated his/her understanding and acceptance.     Plan Discussed with:   Anesthesia Plan Comments:         Anesthesia Quick Evaluation

## 2016-05-11 NOTE — ED Notes (Addendum)
Spoke with Dr Arnoldo Morale- informed me that surgery for the pt would be around noon and to proceed with obtaining a med/surg bed- this nurse called Juliann Pulse, RN, [facility] and informed of need for med/surg bed. Family and pt updated on plan of care.

## 2016-05-11 NOTE — Anesthesia Procedure Notes (Signed)
Procedure Name: Intubation Date/Time: 05/11/2016 11:57 AM Performed by: Charmaine Downs Pre-anesthesia Checklist: Patient identified, Patient being monitored, Timeout performed, Emergency Drugs available and Suction available Patient Re-evaluated:Patient Re-evaluated prior to inductionOxygen Delivery Method: Circle System Utilized Preoxygenation: Pre-oxygenation with 100% oxygen Intubation Type: IV induction, Rapid sequence and Cricoid Pressure applied Ventilation: Mask ventilation without difficulty Laryngoscope Size: Mac and 3 Grade View: Grade I Tube type: Oral Tube size: 8.0 mm Number of attempts: 1 Airway Equipment and Method: stylet and Oral airway Placement Confirmation: ETT inserted through vocal cords under direct vision,  positive ETCO2 and breath sounds checked- equal and bilateral Secured at: 23 cm Tube secured with: Tape Dental Injury: Teeth and Oropharynx as per pre-operative assessment

## 2016-05-11 NOTE — Op Note (Signed)
Patient:  Craig Shaffer  DOB:  [birth year]  MRN:  [id number]   Preop Diagnosis:  Acute appendicitis  Postop Diagnosis:  Same  Procedure:  Laparoscopic appendectomy  Surgeon:  Aviva Signs, M.D.  Anes:  Gen. endotracheal  Indications:  Patient is a 55 year old black male who presents with right lower quadrant abdominal pain. CT scan the abdomen revealed acute appendicitis. The risks and benefits of the procedure including bleeding, infection, and the possibility of an open procedure were fully explained to the patient, who gave informed consent.  Procedure note:  The patient was placed the supine position. After induction of general endotracheal anesthesia, the abdomen was prepped and draped using the usual sterile technique with DuraPrep. Surgical site confirmation was performed.  A supraumbilical incision was made down to the fascia. A Veress needle was introduced into the abdominal cavity and confirmation of placement was done using the saline drop test. The abdomen was then insufflated to 16 mmHg pressure. An 11 mm trocar was introduced into the abdominal cavity under direct visualization without difficulty. The patient was placed in deeper Trendelenburg position and an additional 12 mm trocar was placed the suprapubic region and a 5 mm trocar was placed left lower quadrant region. The appendix was visualized and noted to be acutely inflamed and swollen. There was no evidence of perforation. The mesoappendix was divided using the harmonic scalpel. A vascular Endo GIA was placed across the base the appendix and fired. The appendix was then removed using an Endo Catch bag without difficulty. The staple line was inspected and noted to be within normal limits. All fluid and air were then evacuated from the abdominal cavity prior to the removal of the trochars.  All wounds were irrigated with normal saline. All wounds were injected with 0.5% Sensorcaine. The supraumbilical fascia as well as  suprapubic fascia were reapproximated using 0 Vicryl interrupted sutures. All skin incisions were closed using staples. Betadine ointment and dry sterile dressings were applied.  All tape and needle counts were correct at the end of the procedure. Patient was extubated in the operating room and transferred to PACU in stable condition.  Complications:  None  EBL:  Minimal  Specimen:  Appendix

## 2016-05-11 NOTE — Progress Notes (Signed)
Patient alert and oriented. Vital signs are stable. IV patent. Dressing to abdomen clean dry and intact. No complaints of any distress. Report given to [place], Therapist, sports. Patient to be transferred to room 314 via wheelchair with nursing staff and family member.

## 2016-05-11 NOTE — ED Notes (Signed)
Pt transported to xray via stretcher

## 2016-05-11 NOTE — H&P (Signed)
Craig Shaffer is an 55 y.o. male.   Chief Complaint: Right lower quadrant abdominal pain HPI: Patient is a 55 year old black male who presented emergency room with a 24-hour history of worsening abdominal pain. It started in the upper abdomen and now is localized to the right lower quadrant. CT scan of the abdomen was performed which revealed early acute appendicitis.  Past Medical History:  Diagnosis Date  . Anemia 2012 MCV 76.2 HB 12.3 CR 1.0-1.24   FERRITIN 14   . Demyelinating disease of central nervous system ([place])   . ED (erectile dysfunction)   . Elevated PSA   . Hypertension   . Nicotine addiction     Past Surgical History:  Procedure Laterality Date  . CHOLECYSTECTOMY  5/07  . COLONOSCOPY  2002 DR [facility] POLYP  . COLONOSCOPY  JAN 2013 [facility] ANEMIA   H POLYP, IH  . GIVENS CAPSULE STUDY  10/05/2011   Procedure: GIVENS CAPSULE STUDY;  Surgeon: Danie Binder, MD;  Location: [facility];  Service: Endoscopy;  Laterality: N/A;  . UPPER GASTROINTESTINAL ENDOSCOPY  JAN 2013 ANEMIA   GASTRITIS, NL DUO Bx    Family History  Problem Relation Age of Onset  . Hypertension Mother   . Cancer Mother 57    metastatized to liver, died at age 75  . Hypertension Sister   . Hypertension Brother   . Hypertension Sister   . Hypertension Sister   . Stroke Sister   . Hypertension Sister   . Heart disease Sister 42    stents in 4 valves, ruptd aorta  . Hypertension Brother   . Colon cancer Neg Hx    Social History:  reports that he has been smoking Cigarettes.  He has been smoking about 0.25 packs per day. He has never used smokeless tobacco. He reports that he does not drink alcohol or use drugs.  Allergies: No Known Allergies   (Not in a hospital admission)  Results for orders placed or performed during the hospital encounter of 05/11/16 (from the past 48 hour(s))  CBC with Differential     Status: Abnormal   Collection Time: 05/10/16 11:04 PM  Result Value Ref Range    WBC 14.9 (H) 4.0 - 10.5 K/uL   RBC 4.46 4.22 - 5.81 MIL/uL   Hemoglobin 13.3 13.0 - 17.0 g/dL   HCT 39.2 39.0 - 52.0 %   MCV 87.9 78.0 - 100.0 fL   MCH 29.8 26.0 - 34.0 pg   MCHC 33.9 30.0 - 36.0 g/dL   RDW 14.1 11.5 - 15.5 %   Platelets 210 150 - 400 K/uL   Neutrophils Relative % 82 %   Neutro Abs 12.2 (H) 1.7 - 7.7 K/uL   Lymphocytes Relative 8 %   Lymphs Abs 1.2 0.7 - 4.0 K/uL   Monocytes Relative 10 %   Monocytes Absolute 1.5 (H) 0.1 - 1.0 K/uL   Eosinophils Relative 0 %   Eosinophils Absolute 0.0 0.0 - 0.7 K/uL   Basophils Relative 0 %   Basophils Absolute 0.0 0.0 - 0.1 K/uL  Comprehensive metabolic panel     Status: Abnormal   Collection Time: 05/10/16 11:04 PM  Result Value Ref Range   Sodium 140 135 - 145 mmol/L   Potassium 3.4 (L) 3.5 - 5.1 mmol/L   Chloride 104 101 - 111 mmol/L   CO2 29 22 - 32 mmol/L   Glucose, Bld 111 (H) 65 - 99 mg/dL   BUN 9 6 -  20 mg/dL   Creatinine, Ser 0.98 0.61 - 1.24 mg/dL   Calcium 9.0 8.9 - 10.3 mg/dL   Total Protein 7.8 6.5 - 8.1 g/dL   Albumin 3.9 3.5 - 5.0 g/dL   AST 17 15 - 41 U/L   ALT 13 (L) 17 - 63 U/L   Alkaline Phosphatase 69 38 - 126 U/L   Total Bilirubin 0.4 0.3 - 1.2 mg/dL   GFR calc non Af Amer >60 >60 mL/min   GFR calc Af Amer >60 >60 mL/min    Comment: (NOTE) The eGFR has been calculated using the CKD EPI equation. This calculation has not been validated in all clinical situations. eGFR's persistently <60 mL/min signify possible Chronic Kidney Disease.    Anion gap 7 5 - 15  Lipase, blood     Status: None   Collection Time: 05/10/16 11:04 PM  Result Value Ref Range   Lipase 18 11 - 51 U/L  Urinalysis, Routine w reflex microscopic (not at [facility])     Status: Abnormal   Collection Time: 05/11/16  5:15 AM  Result Value Ref Range   Color, Urine YELLOW YELLOW   APPearance CLEAR CLEAR   Specific Gravity, Urine <1.005 (L) 1.005 - 1.030   pH 7.0 5.0 - 8.0   Glucose, UA NEGATIVE NEGATIVE mg/dL   Hgb urine dipstick  NEGATIVE NEGATIVE   Bilirubin Urine NEGATIVE NEGATIVE   Ketones, ur NEGATIVE NEGATIVE mg/dL   Protein, ur NEGATIVE NEGATIVE mg/dL   Nitrite NEGATIVE NEGATIVE   Leukocytes, UA NEGATIVE NEGATIVE    Comment: MICROSCOPIC NOT DONE ON URINES WITH NEGATIVE PROTEIN, BLOOD, LEUKOCYTES, NITRITE, OR GLUCOSE <1000 mg/dL.   Dg Chest 2 View  Result Date: 05/11/2016 CLINICAL DATA:  Acute onset of generalized abdominal pain. Initial encounter. EXAM: CHEST  2 VIEW COMPARISON:  Chest radiograph performed 06/26/2007 FINDINGS: The lungs are well-aerated and clear. There is no evidence of focal opacification, pleural effusion or pneumothorax. The heart is normal in size; the mediastinal contour is within normal limits. No acute osseous abnormalities are seen. Clips are noted within the right upper quadrant, reflecting prior cholecystectomy. IMPRESSION: No acute cardiopulmonary process seen. Electronically Signed   By: Garald Balding M.D.   On: 05/11/2016 07:02   Ct Abdomen Pelvis W Contrast  Result Date: 05/11/2016 CLINICAL DATA:  Right lower quadrant pain EXAM: CT ABDOMEN AND PELVIS WITH CONTRAST TECHNIQUE: Multidetector CT imaging of the abdomen and pelvis was performed using the standard protocol following bolus administration of intravenous contrast. CONTRAST:  100 mL Isovue-300 IV, 30 mL Isovue-300 PO COMPARISON:  CT abdomen pelvis 04/30/2006 FINDINGS: Lower chest: Posterior right basilar opacity. No pleural effusion or pulmonary nodule. Hepatobiliary: Normal hepatic size and contours without focal liver lesion. No perihepatic ascites. No intra- or extrahepatic biliary dilatation. Status post cholecystectomy. Pancreas: Normal pancreatic contours and enhancement. No peripancreatic fluid collection or pancreatic ductal dilatation. Spleen: Normal. Adrenals/Urinary Tract: Normal adrenal glands. There is a large right renal cyst, measuring 5 cm. Stomach/Bowel: No abnormal bowel dilatation. No bowel wall thickening or  adjacent fat stranding to indicate acute inflammation. No abdominal fluid collection. The appendix is mildly dilated with adjacent inflammatory stranding. No evidence of perforation or abscess formation. Vascular/Lymphatic: There is atherosclerotic calcification of the non aneurysmal abdominal aorta. No abdominal or pelvic adenopathy. Reproductive: The prostate is enlarged mildly measuring 4.8 cm. Musculoskeletal: Lower lumbar facet arthrosis. No bony spinal canal stenosis. No lytic or blastic lesions. Normal visualized extrathoracic and extraperitoneal soft tissues. Other: No contributory non-categorized  findings. IMPRESSION: 1. Mildly dilated appendix with surrounding inflammatory stranding, consistent with early findings of acute appendicitis. 2. No abscess formation or evidence of appendiceal perforation. Electronically Signed   By: Ulyses Jarred M.D.   On: 05/11/2016 04:17    Review of Systems  Constitutional: Positive for malaise/fatigue.  HENT: Negative.   Eyes: Negative.   Respiratory: Negative.   Cardiovascular: Negative.   Gastrointestinal: Positive for abdominal pain and nausea.  Genitourinary: Negative.   Musculoskeletal: Negative.   Skin: Negative.   Neurological: Negative.   Endo/Heme/Allergies: Negative.   Psychiatric/Behavioral: Negative.     Blood pressure 141/89, pulse 78, temperature 98.5 F (36.9 C), temperature source Oral, resp. rate 14, height _0  (1.753 m), weight 67.1 kg (148 lb), SpO2 99 %. Physical Exam  Constitutional: He is oriented to person, place, and time. He appears well-developed and well-nourished.  HENT:  Head: Normocephalic and atraumatic.  Neck: Normal range of motion. Neck supple.  Cardiovascular: Normal rate, regular rhythm and normal heart sounds.   Respiratory: Effort normal and breath sounds normal.  GI: Soft. He exhibits no distension. There is tenderness. There is no rebound.  Tender in the right lower quadrant to palpation. No rigidity  noted.  Neurological: He is alert and oriented to person, place, and time.  Skin: Skin is warm and dry.     Assessment/Plan Impression: Acute appendicitis Plan: Patient be taken to the operating room for laparoscopic appendectomy. The risks and benefits of the procedure including bleeding, infection, and the possibility of an open procedure were fully explained to the patient, who gave informed consent.  Jamesetta So, MD 05/11/2016, 8:56 AM

## 2016-05-11 NOTE — ED Notes (Signed)
Pt taken to CT via stretcher.

## 2016-05-11 NOTE — Anesthesia Postprocedure Evaluation (Signed)
Anesthesia Post Note  Patient: Craig Shaffer  Procedure(s) Performed: Procedure(s) (LRB): APPENDECTOMY LAPAROSCOPIC (N/A)  Patient location during evaluation: PACU Anesthesia Type: General Level of consciousness: patient cooperative Pain management: pain level controlled Vital Signs Assessment: post-procedure vital signs reviewed and stable Respiratory status: spontaneous breathing Cardiovascular status: blood pressure returned to baseline and stable Postop Assessment: no signs of nausea or vomiting Anesthetic complications: no    Last Vitals:  Vitals:   05/11/16 1120 05/11/16 1125  BP:    Pulse:    Resp: 14 12  Temp:      Last Pain:  Vitals:   05/11/16 0932  TempSrc: Oral  PainSc: 3                  Ulice Follett J

## 2016-05-11 NOTE — Transfer of Care (Signed)
Immediate Anesthesia Transfer of Care Note  Patient: Craig Shaffer  Procedure(s) Performed: Procedure(s): APPENDECTOMY LAPAROSCOPIC (N/A)  Patient Location: PACU  Anesthesia Type:General  Level of Consciousness: sedated and patient cooperative  Airway & Oxygen Therapy: Patient Spontanous Breathing and Patient connected to face mask oxygen  Post-op Assessment: Report given to RN, Post -op Vital signs reviewed and stable and Patient moving all extremities  Post vital signs: Reviewed and stable  Last Vitals:  Vitals:   05/11/16 1120 05/11/16 1125  BP:    Pulse:    Resp: 14 12  Temp:      Last Pain:  Vitals:   05/11/16 0932  TempSrc: Oral  PainSc: 3       Patients Stated Pain Goal: 5 ([id number])  Complications: No apparent anesthesia complications

## 2016-05-11 NOTE — ED Provider Notes (Signed)
[facility] DEPT Provider Note   CSN: [id number] Arrival date & time: 05/10/16  2129  Time seen 02:00 AM   History   Chief Complaint Chief Complaint  Patient presents with  . Abdominal Pain    HPI Craig Shaffer is a 55 y.o. male.  HPI patient reports he woke up at 3:30 AM yesterday morning with a pain in his lower central chest that he thought was reflux because it was a burning sensation. He denies getting burning fluid in each throat however. He states throughout the day the pain is now moved and is in his lower abdomen and points to the suprapubic area. He states it is constant and aching. He states laying on his abdomen and coughing makes her a lot, nothing makes it feel better. He had nausea earlier this morning but no vomiting. He denies diarrhea but states he had 1-2 soft stools today. He is unsure of fever but has been feeling hot and cold. He denies any dysuria but did have some frequency last night. He states he had decreased appetite today. He reports having similar pains about once a month that lasts 5-10 minutes. He states he was on PPIs however he stopped them a couple years ago. He does not associate the pain with food and states he avoids foods that make his GERD worse.  PCP Dr. Moshe Cipro  Past Medical History:  Diagnosis Date  . Anemia 2012 MCV 76.2 HB 12.3 CR 1.0-1.24   FERRITIN 14   . Demyelinating disease of central nervous system ([place])   . ED (erectile dysfunction)   . Elevated PSA   . Hypertension   . Nicotine addiction     Patient Active Problem List   Diagnosis Date Noted  . NICOTINE ADDICTION 05/15/2010  . ELEVATED PROSTATE SPECIFIC ANTIGEN 12/28/2008  . ERECTILE DYSFUNCTION 12/27/2007  . OTHER DEMYELINATING DISEASES OF CNTRL NERV SYS 12/27/2007  . Essential hypertension 12/27/2007    Past Surgical History:  Procedure Laterality Date  . CHOLECYSTECTOMY  5/07  . COLONOSCOPY  2002 DR [facility] POLYP  . COLONOSCOPY  JAN 2013 [facility] ANEMIA   H  POLYP, IH  . GIVENS CAPSULE STUDY  10/05/2011   Procedure: GIVENS CAPSULE STUDY;  Surgeon: Danie Binder, MD;  Location: [facility];  Service: Endoscopy;  Laterality: N/A;  . UPPER GASTROINTESTINAL ENDOSCOPY  JAN 2013 ANEMIA   GASTRITIS, NL DUO Bx       Home Medications    Prior to Admission medications   Medication Sig Start Date End Date Taking? Authorizing Provider  aspirin (ASPIRIN LOW DOSE) 81 MG EC tablet TAKE 1 TABLET BY MOUTH EVERY DAY 08/30/12   Fayrene Helper, MD  diltiazem (TAZTIA XT) 360 MG 24 hr capsule TAKE 1 CAPSULE BY MOUTH EVERY DAY 08/10/15   Fayrene Helper, MD  diltiazem [facility]) 360 MG 24 hr capsule TAKE 1 CAPSULE BY MOUTH EVERY DAY 09/06/15   Fayrene Helper, MD    Family History Family History  Problem Relation Age of Onset  . Hypertension Mother   . Cancer Mother 52    metastatized to liver, died at age 34  . Hypertension Sister   . Hypertension Brother   . Hypertension Sister   . Hypertension Sister   . Stroke Sister   . Hypertension Sister   . Heart disease Sister 90    stents in 4 valves, ruptd aorta  . Hypertension Brother   . Colon cancer Neg Hx  Social History Social History  Substance Use Topics  . Smoking status: Current Every Day Smoker    Packs/day: 0.25    Types: Cigarettes  . Smokeless tobacco: Never Used     Comment: 4 per day   . Alcohol use No  employed   Allergies   Patient has no known allergies.   Review of Systems Review of Systems  All other systems reviewed and are negative.    Physical Exam Updated Vital Signs BP 147/87   Pulse 84   Temp 98.5 F (36.9 C) (Oral)   Resp 17   Ht 5\' 9"  (1.753 m)   Wt 148 lb (67.1 kg)   SpO2 100%   BMI 21.86 kg/m   Vital signs normal    Physical Exam  Constitutional: He is oriented to person, place, and time. He appears well-developed and well-nourished.  Non-toxic appearance. He does not appear ill. No distress.  HENT:  Head: Normocephalic and  atraumatic.  Right Ear: External ear normal.  Left Ear: External ear normal.  Nose: Nose normal. No mucosal edema or rhinorrhea.  Mouth/Throat: Oropharynx is clear and moist and mucous membranes are normal. No dental abscesses or uvula swelling.  Eyes: Conjunctivae and EOM are normal. Pupils are equal, round, and reactive to light.  Neck: Normal range of motion and full passive range of motion without pain. Neck supple.  Cardiovascular: Normal rate, regular rhythm and normal heart sounds.  Exam reveals no gallop and no friction rub.   No murmur heard. Pulmonary/Chest: Effort normal and breath sounds normal. No respiratory distress. He has no wheezes. He has no rhonchi. He has no rales. He exhibits no tenderness and no crepitus.  Abdominal: Soft. Normal appearance and bowel sounds are normal. He exhibits no distension. There is no tenderness. There is no rebound and no guarding.    Tender diffusely in the lower abdomen but is very tender in the right lower quadrant. No guarding or rebound. No pain with knee tapping.  Musculoskeletal: Normal range of motion. He exhibits no edema or tenderness.  Moves all extremities well.   Neurological: He is alert and oriented to person, place, and time. He has normal strength. No cranial nerve deficit.  Skin: Skin is warm, dry and intact. No rash noted. No erythema. No pallor.  Psychiatric: He has a normal mood and affect. His speech is normal and behavior is normal. His mood appears not anxious.  Nursing note and vitals reviewed.    ED Treatments / Results  Labs (all labs ordered are listed, but only abnormal results are displayed) Results for orders placed or performed during the hospital encounter of 05/11/16  CBC with Differential  Result Value Ref Range   WBC 14.9 (H) 4.0 - 10.5 K/uL   RBC 4.46 4.22 - 5.81 MIL/uL   Hemoglobin 13.3 13.0 - 17.0 g/dL   HCT 39.2 39.0 - 52.0 %   MCV 87.9 78.0 - 100.0 fL   MCH 29.8 26.0 - 34.0 pg   MCHC 33.9 30.0 -  36.0 g/dL   RDW 14.1 11.5 - 15.5 %   Platelets 210 150 - 400 K/uL   Neutrophils Relative % 82 %   Neutro Abs 12.2 (H) 1.7 - 7.7 K/uL   Lymphocytes Relative 8 %   Lymphs Abs 1.2 0.7 - 4.0 K/uL   Monocytes Relative 10 %   Monocytes Absolute 1.5 (H) 0.1 - 1.0 K/uL   Eosinophils Relative 0 %   Eosinophils Absolute 0.0 0.0 - 0.7 K/uL  Basophils Relative 0 %   Basophils Absolute 0.0 0.0 - 0.1 K/uL  Comprehensive metabolic panel  Result Value Ref Range   Sodium 140 135 - 145 mmol/L   Potassium 3.4 (L) 3.5 - 5.1 mmol/L   Chloride 104 101 - 111 mmol/L   CO2 29 22 - 32 mmol/L   Glucose, Bld 111 (H) 65 - 99 mg/dL   BUN 9 6 - 20 mg/dL   Creatinine, Ser 0.98 0.61 - 1.24 mg/dL   Calcium 9.0 8.9 - 10.3 mg/dL   Total Protein 7.8 6.5 - 8.1 g/dL   Albumin 3.9 3.5 - 5.0 g/dL   AST 17 15 - 41 U/L   ALT 13 (L) 17 - 63 U/L   Alkaline Phosphatase 69 38 - 126 U/L   Total Bilirubin 0.4 0.3 - 1.2 mg/dL   GFR calc non Af Amer >60 >60 mL/min   GFR calc Af Amer >60 >60 mL/min   Anion gap 7 5 - 15  Urinalysis, Routine w reflex microscopic (not at [facility])  Result Value Ref Range   Color, Urine YELLOW YELLOW   APPearance CLEAR CLEAR   Specific Gravity, Urine <1.005 (L) 1.005 - 1.030   pH 7.0 5.0 - 8.0   Glucose, UA NEGATIVE NEGATIVE mg/dL   Hgb urine dipstick NEGATIVE NEGATIVE   Bilirubin Urine NEGATIVE NEGATIVE   Ketones, ur NEGATIVE NEGATIVE mg/dL   Protein, ur NEGATIVE NEGATIVE mg/dL   Nitrite NEGATIVE NEGATIVE   Leukocytes, UA NEGATIVE NEGATIVE  Lipase, blood  Result Value Ref Range   Lipase 18 11 - 51 U/L   Laboratory interpretation all normal except Leukocytosis    EKG  EKG Interpretation None       Radiology Ct Abdomen Pelvis W Contrast  Result Date: 05/11/2016 CLINICAL DATA:  Right lower quadrant pain EXAM: CT ABDOMEN AND PELVIS WITH CONTRAST TECHNIQUE: Multidetector CT imaging of the abdomen and pelvis was performed using the standard protocol following bolus administration of  intravenous contrast. CONTRAST:  100 mL Isovue-300 IV, 30 mL Isovue-300 PO COMPARISON:  CT abdomen pelvis 04/30/2006 FINDINGS: Lower chest: Posterior right basilar opacity. No pleural effusion or pulmonary nodule. Hepatobiliary: Normal hepatic size and contours without focal liver lesion. No perihepatic ascites. No intra- or extrahepatic biliary dilatation. Status post cholecystectomy. Pancreas: Normal pancreatic contours and enhancement. No peripancreatic fluid collection or pancreatic ductal dilatation. Spleen: Normal. Adrenals/Urinary Tract: Normal adrenal glands. There is a large right renal cyst, measuring 5 cm. Stomach/Bowel: No abnormal bowel dilatation. No bowel wall thickening or adjacent fat stranding to indicate acute inflammation. No abdominal fluid collection. The appendix is mildly dilated with adjacent inflammatory stranding. No evidence of perforation or abscess formation. Vascular/Lymphatic: There is atherosclerotic calcification of the non aneurysmal abdominal aorta. No abdominal or pelvic adenopathy. Reproductive: The prostate is enlarged mildly measuring 4.8 cm. Musculoskeletal: Lower lumbar facet arthrosis. No bony spinal canal stenosis. No lytic or blastic lesions. Normal visualized extrathoracic and extraperitoneal soft tissues. Other: No contributory non-categorized findings. IMPRESSION: 1. Mildly dilated appendix with surrounding inflammatory stranding, consistent with early findings of acute appendicitis. 2. No abscess formation or evidence of appendiceal perforation. Electronically Signed   By: Ulyses Jarred M.D.   On: 05/11/2016 04:17    Procedures Procedures (including critical care time)  Medications Ordered in ED Medications  sodium chloride 0.9 % bolus 1,000 mL (0 mLs Intravenous Stopped 05/11/16 0417)  sodium chloride 0.9 % bolus 500 mL (0 mLs Intravenous Stopped 05/11/16 0330)  ondansetron (ZOFRAN) injection 4 mg (4  mg Intravenous Given 05/11/16 0241)  morphine 4 MG/ML  injection 4 mg (4 mg Intravenous Given 05/11/16 0241)  iopamidol (ISOVUE-300) 61 % injection (30 mLs Oral Contrast Given 05/11/16 0347)  iopamidol (ISOVUE-300) 61 % injection 100 mL (100 mLs Intravenous Contrast Given 05/11/16 0346)  piperacillin-tazobactam (ZOSYN) IVPB 3.375 g (3.375 g Intravenous New Bag/Given 05/11/16 0530)     Initial Impression / Assessment and Plan / ED Course  I have reviewed the triage vital signs and the nursing notes.  Pertinent labs & imaging results that were available during my care of the patient were reviewed by me and considered in my medical decision making (see chart for details).  Clinical Course    Patient was given IV fluids and IV pain and nausea medication. We discussed getting a CT abdomen to look for appendicitis.  5:15 AM CT was reviewed, he was started on Zosyn for his appendicitis. There is a shortage of IV Flagyl and I did not want to give him a thing oral prior to surgery. Patient was given his test results. Surgery was consulted.  05:52 Dr Arnoldo Morale, surgery, will see patient in the ED in about 1 hour, NPO  I inadvertently ordered oral Flagyl when looking for antibiotics to give him for his appendicitis because the risks and IV Flagyl shortage. Nursing staff gave it orally despite it being written to be given 3 times a day. He also was given the IV Zosyn that I wanted.  Final Clinical Impressions(s) / ED Diagnoses   Final diagnoses:  Acute appendicitis with localized peritonitis   Plan admission   Rolland Porter, MD, [place] admission  Rolland Porter, MD, Barbette Or, MD 05/11/16 [phone]

## 2016-05-12 ENCOUNTER — Encounter (HOSPITAL_COMMUNITY): Payer: Self-pay | Admitting: General Surgery

## 2016-05-12 DIAGNOSIS — K353 Acute appendicitis with localized peritonitis: Secondary | ICD-10-CM | POA: Diagnosis not present

## 2016-05-12 LAB — CBC
HEMATOCRIT: 34.6 % — AB (ref 39.0–52.0)
Hemoglobin: 11.4 g/dL — ABNORMAL LOW (ref 13.0–17.0)
MCH: 29.5 pg (ref 26.0–34.0)
MCHC: 32.9 g/dL (ref 30.0–36.0)
MCV: 89.4 fL (ref 78.0–100.0)
Platelets: 199 10*3/uL (ref 150–400)
RBC: 3.87 MIL/uL — AB (ref 4.22–5.81)
RDW: 14 % (ref 11.5–15.5)
WBC: 7.8 10*3/uL (ref 4.0–10.5)

## 2016-05-12 MED ORDER — HYDROCODONE-ACETAMINOPHEN 5-325 MG PO TABS: 1.0000 | ORAL_TABLET | ORAL | 0 refills | Status: DC | PRN

## 2016-05-12 NOTE — Care Management Obs Status (Signed)
[facility] NOTIFICATION   Patient Details  Name: LEIM DIANTONIO MRN: [id number] Date of Birth: [birth year]   Medicare Observation Status Notification Given:  Yes    Sherald Barge, RN 05/12/2016, 9:36 AM

## 2016-05-12 NOTE — Discharge Instructions (Signed)
Laparoscopic Appendectomy, Adult, Care After °Refer to this sheet in the next few weeks. These instructions provide you with information about caring for yourself after your procedure. Your health care provider may also give you more specific instructions. Your treatment has been planned according to current medical practices, but problems sometimes occur. Call your health care provider if you have any problems or questions after your procedure. °What can I expect after the procedure? °After the procedure, it is common to have: °· A decrease in your energy level. °· Mild pain in the area where the surgical cuts (incisions) were made. °· Constipation. This can be caused by pain medicine and a decrease in your activity. ° °Follow these instructions at home: °Medicines °· Take over-the-counter and prescription medicines only as told by your health care provider. °· Do not drive for 24 hours if you received a sedative. °· Do not drive or operate heavy machinery while taking prescription pain medicine. °· If you were prescribed an antibiotic medicine, take it as told by your health care provider. Do not stop taking the antibiotic even if you start to feel better. °Activity °· For 3 weeks or as long as told by your health care provider: °? Do not lift anything that is heavier than 10 pounds (4.5 kg). °? Do not play contact sports. °· Gradually return to your normal activities. Ask your health care provider what activities are safe for you. °Bathing °· Keep your incisions clean and dry. Clean them as often as told by your health care provider: °? Gently wash the incisions with soap and water. °? Rinse the incisions with water to remove all soap. °? Pat the incisions dry with a clean towel. Do not rub the incisions. °· You may take showers after 48 hours. °· Do not take baths, swim, or use hot tubs for 2 weeks or as told by your health care provider. °Incision care °· Follow instructions from your healthcare provider about  how to take care of your incisions. Make sure you: °? Wash your hands with soap and water before you change your bandage (dressing). If soap and water are not available, use hand sanitizer. °? Change your dressing as told by your health care provider. °? Leave stitches (sutures), skin glue, or adhesive strips in place. These skin closures may need to stay in place for 2 weeks or longer. If adhesive strip edges start to loosen and curl up, you may trim the loose edges. Do not remove adhesive strips completely unless your health care provider tells you to do that. °· Check your incision areas every day for signs of infection. Check for: °? More redness, swelling, or pain. °? More fluid or blood. °? Warmth. °? Pus or a bad smell. °Other Instructions °· If you were sent home with a drain, follow instructions from your health care provider about how to care for the drain and how to empty it. °· Take deep breaths. This helps to prevent your lungs from becoming inflamed. °· To relieve and prevent constipation: °? Drink plenty of fluids. °? Eat plenty of fruits and vegetables. °· Keep all follow-up visits as told by your health care provider. This is important. °Contact a health care provider if: °· You have more redness, swelling, or pain around an incision. °· You have more fluid or blood coming from an incision. °· Your incision feels warm to the touch. °· You have pus or a bad smell coming from an incision or dressing. °· Your incision   dressing. °· Your incision edges break open after your sutures have been removed. °· You have increasing pain in your shoulders. °· You feel dizzy or you faint. °· You develop shortness of breath. °· You keep feeling nauseous or vomiting. °· You have diarrhea or you cannot control your bowel functions. °· You lose your appetite. °· You develop swelling or pain in your legs. °Get help right away if: °· You have a fever. °· You develop a rash. °· You have difficulty breathing. °· You have sharp pains in your  chest. °This information is not intended to replace advice given to you by your health care provider. Make sure you discuss any questions you have with your health care provider. °Document Released: 05/29/2005 Document Revised: 10/29/2015 Document Reviewed: 11/16/2014 °Elsevier Interactive Patient Education © 2017 Elsevier Inc. ° °

## 2016-05-12 NOTE — Discharge Summary (Signed)
Physician Discharge Summary  Patient ID: Craig Shaffer MRN: [id number] DOB/AGE: [birth year] 57 y.o.  Admit date: 05/11/2016 Discharge date: 05/12/2016  Admission Diagnoses:Acute appendicitis  Discharge Diagnoses: Same Active Problems:   Acute appendicitis   Discharged Condition: good  Hospital Course: Patient is a 55 year old black male who presented emergency room with a 24-hour history of abdominal pain which ultimately radiated to the right lower quadrant. CT scan of the abdomen revealed early acute appendicitis. The patient was taken to the operating room on 05/11/2016 and underwent a laparoscopic appendectomy. He tolerated the procedure well. His postoperative course was unremarkable. His diet was advanced without difficulty. The patient is being discharged home on 05/12/2016 in good and improving condition.  Treatments: surgery: Laparoscopic appendectomy on 05/11/2016  Discharge Exam: Blood pressure 119/66, pulse 68, temperature 98 F (36.7 C), temperature source Oral, resp. rate 18, height 5\' 9"  (1.753 m), weight 68.8 kg (151 lb 10.8 oz), SpO2 100 %. General appearance: alert, cooperative and no distress Resp: clear to auscultation bilaterally Cardio: regular rate and rhythm, S1, S2 normal, no murmur, click, rub or gallop GI: Soft, dressings dry and intact.  Disposition: 01-Home or Self Care     Medication List    TAKE these medications   aspirin 81 MG EC tablet Commonly known as:  ASPIRIN LOW DOSE TAKE 1 TABLET BY MOUTH EVERY DAY   diltiazem 360 MG 24 hr capsule Commonly known as:  TAZTIA XT TAKE 1 CAPSULE BY MOUTH EVERY DAY   HYDROcodone-acetaminophen 5-325 MG tablet Commonly known as:  NORCO Take 1-2 tablets by mouth every 4 (four) hours as needed for moderate pain.      Follow-up Information    Jamesetta So, MD. Schedule an appointment as soon as possible for a visit on 05/18/2016.   Specialty:  General Surgery Contact information: [id number] [place] [phone]           Signed: Aviva Signs A 05/12/2016, 11:11 AM

## 2016-05-12 NOTE — Addendum Note (Signed)
Addendum  created 05/12/16 0732 by Ollen Bowl, CRNA   Sign clinical note

## 2016-05-12 NOTE — Anesthesia Postprocedure Evaluation (Signed)
Anesthesia Post Note  Patient: Craig Shaffer  Procedure(s) Performed: Procedure(s) (LRB): APPENDECTOMY LAPAROSCOPIC (N/A)  Patient location during evaluation: Nursing Unit Anesthesia Type: General Level of consciousness: awake and alert and oriented Pain management: satisfactory to patient Vital Signs Assessment: post-procedure vital signs reviewed and stable Respiratory status: spontaneous breathing Cardiovascular status: blood pressure returned to baseline Postop Assessment: no signs of nausea or vomiting Anesthetic complications: no    Last Vitals:  Vitals:   05/12/16 0200 05/12/16 0654  BP: 127/69 119/66  Pulse: 62 68  Resp: 18 18  Temp: 36.8 C 36.7 C    Last Pain:  Vitals:   05/12/16 0654  TempSrc: Oral  PainSc:                  Tressie Stalker

## 2016-05-12 NOTE — Progress Notes (Signed)
Pt IV removed, tolerated well.  Reviewed discharge instructions with pt and answered questions at this time.   

## 2016-05-12 NOTE — Care Management Note (Signed)
Case Management Note  Patient Details  Name: Craig Shaffer MRN: [id number] Date of Birth: Jul 15, 1960  Subjective/Objective:                  Pt admitted s/p lap appendectomy. Pt is ind with ADL's. He is from home and lives with sister and brother. He has PCP, transportation to appointments and no difficulty affording medications.   Action/Plan: Plan on discharging home with self care. No CM needs anticipated.   Expected Discharge Date:     05/12/2016             Expected Discharge Plan:  Home/Self Care  In-House Referral:  NA  Discharge planning Services  CM Consult  Post Acute Care Choice:  NA Choice offered to:  NA  Status of Service:  Completed, signed off  Sherald Barge, RN 05/12/2016, 9:36 AM

## 2016-06-14 ENCOUNTER — Telehealth: Payer: Self-pay

## 2016-06-14 DIAGNOSIS — Z13220 Encounter for screening for lipoid disorders: Secondary | ICD-10-CM | POA: Diagnosis not present

## 2016-06-14 DIAGNOSIS — I10 Essential (primary) hypertension: Secondary | ICD-10-CM | POA: Diagnosis not present

## 2016-06-14 LAB — LIPID PANEL
Cholesterol: 169 mg/dL (ref <200)
HDL: 51 mg/dL (ref >40)
LDL CALC: 92 mg/dL (ref <100)
Total CHOL/HDL Ratio: 3.3 Ratio (ref <5.0)
Triglycerides: 129 mg/dL (ref <150)
VLDL: 26 mg/dL (ref <30)

## 2016-06-14 LAB — BASIC METABOLIC PANEL
BUN: 12 mg/dL (ref 7–25)
CALCIUM: 8.8 mg/dL (ref 8.6–10.3)
CHLORIDE: 105 mmol/L (ref 98–110)
CO2: 27 mmol/L (ref 20–31)
CREATININE: 1.03 mg/dL (ref 0.70–1.33)
Glucose, Bld: 79 mg/dL (ref 65–99)
Potassium: 4.1 mmol/L (ref 3.5–5.3)
Sodium: 138 mmol/L (ref 135–146)

## 2016-06-14 LAB — CBC
HCT: 39.7 % (ref 38.5–50.0)
Hemoglobin: 13 g/dL — ABNORMAL LOW (ref 13.2–17.1)
MCH: 29.2 pg (ref 27.0–33.0)
MCHC: 32.7 g/dL (ref 32.0–36.0)
MCV: 89.2 fL (ref 80.0–100.0)
MPV: 11.2 fL (ref 7.5–12.5)
Platelets: 213 10*3/uL (ref 140–400)
RBC: 4.45 MIL/uL (ref 4.20–5.80)
RDW: 15.4 % — AB (ref 11.0–15.0)
WBC: 5.5 10*3/uL (ref 3.8–10.8)

## 2016-06-14 LAB — TSH: TSH: 0.89 m[IU]/L (ref 0.40–4.50)

## 2016-06-14 NOTE — Telephone Encounter (Signed)
Labs re-ordered

## 2016-06-15 ENCOUNTER — Encounter: Payer: Self-pay | Admitting: Family Medicine

## 2016-06-19 ENCOUNTER — Encounter: Payer: Medicare Other | Admitting: Family Medicine

## 2016-06-19 ENCOUNTER — Ambulatory Visit: Payer: Medicare Other

## 2016-06-22 ENCOUNTER — Ambulatory Visit (INDEPENDENT_AMBULATORY_CARE_PROVIDER_SITE_OTHER): Payer: Medicare Other

## 2016-06-22 VITALS — BP 130/78 | HR 81 | Temp 97.6°F | Ht 69.0 in | Wt 156.1 lb

## 2016-06-22 DIAGNOSIS — Z0000 Encounter for general adult medical examination without abnormal findings: Secondary | ICD-10-CM | POA: Diagnosis not present

## 2016-06-22 NOTE — Patient Instructions (Addendum)
Health maintenance: Due for Flu and Tetanus, patient declined at this time.   Abnormal screenings: None   Patient concerns: None   Nurse concerns: Quit smoking   Next PCP appt: in 6-8 weeks with Dr. Moshe Cipro   Health Maintenance, Male A healthy lifestyle and preventative care can promote health and wellness.  Maintain regular health, dental, and eye exams.  Eat a healthy diet. Foods like vegetables, fruits, whole grains, low-fat dairy products, and lean protein foods contain the nutrients you need and are low in calories. Decrease your intake of foods high in solid fats, added sugars, and salt. Get information about a proper diet from your health care provider, if necessary.  Regular physical exercise is one of the most important things you can do for your health. Most adults should get at least 150 minutes of moderate-intensity exercise (any activity that increases your heart rate and causes you to sweat) each week. In addition, most adults need muscle-strengthening exercises on 2 or more days a week.   Maintain a healthy weight. The body mass index (BMI) is a screening tool to identify possible weight problems. It provides an estimate of body fat based on height and weight. Your health care provider can find your BMI and can help you achieve or maintain a healthy weight. For males 20 years and older:  A BMI below 18.5 is considered underweight.  A BMI of 18.5 to 24.9 is normal.  A BMI of 25 to 29.9 is considered overweight.  A BMI of 30 and above is considered obese.  Maintain normal blood lipids and cholesterol by exercising and minimizing your intake of saturated fat. Eat a balanced diet with plenty of fruits and vegetables. Blood tests for lipids and cholesterol should begin at age 48 and be repeated every 5 years. If your lipid or cholesterol levels are high, you are over age 96, or you are at high risk for heart disease, you may need your cholesterol levels checked more  frequently.Ongoing high lipid and cholesterol levels should be treated with medicines if diet and exercise are not working.  If you smoke, find out from your health care provider how to quit. If you do not use tobacco, do not start.  Lung cancer screening is recommended for adults aged 67-80 years who are at high risk for developing lung cancer because of a history of smoking. A yearly low-dose CT scan of the lungs is recommended for people who have at least a 30-pack-year history of smoking and are current smokers or have quit within the past 15 years. A pack year of smoking is smoking an average of 1 pack of cigarettes a day for 1 year (for example, a 30-pack-year history of smoking could mean smoking 1 pack a day for 30 years or 2 packs a day for 15 years). Yearly screening should continue until the smoker has stopped smoking for at least 15 years. Yearly screening should be stopped for people who develop a health problem that would prevent them from having lung cancer treatment.  If you choose to drink alcohol, do not have more than 2 drinks per day. One drink is considered to be 12 oz (360 mL) of beer, 5 oz (150 mL) of wine, or 1.5 oz (45 mL) of liquor.  Avoid the use of street drugs. Do not share needles with anyone. Ask for help if you need support or instructions about stopping the use of drugs.  High blood pressure causes heart disease and increases the risk  of stroke. High blood pressure is more likely to develop in:  People who have blood pressure in the end of the normal range (100-139/85-89 mm Hg).  People who are overweight or obese.  People who are African American.  If you are 47-69 years of age, have your blood pressure checked every 3-5 years. If you are 42 years of age or older, have your blood pressure checked every year. You should have your blood pressure measured twice-once when you are at a hospital or clinic, and once when you are not at a hospital or clinic. Record the  average of the two measurements. To check your blood pressure when you are not at a hospital or clinic, you can use:  An automated blood pressure machine at a pharmacy.  A home blood pressure monitor.  If you are 50-38 years old, ask your health care provider if you should take aspirin to prevent heart disease.  Diabetes screening involves taking a blood sample to check your fasting blood sugar level. This should be done once every 3 years after age 31 if you are at a normal weight and without risk factors for diabetes. Testing should be considered at a younger age or be carried out more frequently if you are overweight and have at least 1 risk factor for diabetes.  Colorectal cancer can be detected and often prevented. Most routine colorectal cancer screening begins at the age of 34 and continues through age 71. However, your health care provider may recommend screening at an earlier age if you have risk factors for colon cancer. On a yearly basis, your health care provider may provide home test kits to check for hidden blood in the stool. A small camera at the end of a tube may be used to directly examine the colon (sigmoidoscopy or colonoscopy) to detect the earliest forms of colorectal cancer. Talk to your health care provider about this at age 68 when routine screening begins. A direct exam of the colon should be repeated every 5-10 years through age 26, unless early forms of precancerous polyps or small growths are found.  People who are at an increased risk for hepatitis B should be screened for this virus. You are considered at high risk for hepatitis B if:  You were born in a country where hepatitis B occurs often. Talk with your health care provider about which countries are considered high risk.  Your parents were born in a high-risk country and you have not received a shot to protect against hepatitis B (hepatitis B vaccine).  You have HIV or AIDS.  You use needles to inject street  drugs.  You live with, or have sex with, someone who has hepatitis B.  You are a man who has sex with other men (MSM).  You get hemodialysis treatment.  You take certain medicines for conditions like cancer, organ transplantation, and autoimmune conditions.  Hepatitis C blood testing is recommended for all people born from [birth year] through 1967 and any individual with known risk factors for hepatitis C.  Healthy men should no longer receive prostate-specific antigen (PSA) blood tests as part of routine cancer screening. Talk to your health care provider about prostate cancer screening.  Testicular cancer screening is not recommended for adolescents or adult males who have no symptoms. Screening includes self-exam, a health care provider exam, and other screening tests. Consult with your health care provider about any symptoms you have or any concerns you have about testicular cancer.  Practice safe sex.  Use condoms and avoid high-risk sexual practices to reduce the spread of sexually transmitted infections (STIs).  You should be screened for STIs, including gonorrhea and chlamydia if:  You are sexually active and are younger than 24 years.  You are older than 24 years, and your health care provider tells you that you are at risk for this type of infection.  Your sexual activity has changed since you were last screened, and you are at an increased risk for chlamydia or gonorrhea. Ask your health care provider if you are at risk.  If you are at risk of being infected with HIV, it is recommended that you take a prescription medicine daily to prevent HIV infection. This is called pre-exposure prophylaxis (PrEP). You are considered at risk if:  You are a man who has sex with other men (MSM).  You are a heterosexual man who is sexually active with multiple partners.  You take drugs by injection.  You are sexually active with a partner who has HIV.  Talk with your health care provider about  whether you are at high risk of being infected with HIV. If you choose to begin PrEP, you should first be tested for HIV. You should then be tested every 3 months for as long as you are taking PrEP.  Use sunscreen. Apply sunscreen liberally and repeatedly throughout the day. You should seek shade when your shadow is shorter than you. Protect yourself by wearing long sleeves, pants, a wide-brimmed hat, and sunglasses year round whenever you are outdoors.  Tell your health care provider of new moles or changes in moles, especially if there is a change in shape or color. Also, tell your health care provider if a mole is larger than the size of a pencil eraser.  A one-time screening for abdominal aortic aneurysm (AAA) and surgical repair of large AAAs by ultrasound is recommended for men aged 80-75 years who are current or former smokers.  Stay current with your vaccines (immunizations). This information is not intended to replace advice given to you by your health care provider. Make sure you discuss any questions you have with your health care provider. Document Released: 11/25/2007 Document Revised: 06/19/2014 Document Reviewed: 03/02/2015 Elsevier Interactive Patient Education  2017 [employer].   Steps to Quit Smoking Smoking tobacco can be bad for your health. It can also affect almost every organ in your body. Smoking puts you and people around you at risk for many serious long-lasting (chronic) diseases. Quitting smoking is hard, but it is one of the best things that you can do for your health. It is never too late to quit. What are the benefits of quitting smoking? When you quit smoking, you lower your risk for getting serious diseases and conditions. They can include:  Lung cancer or lung disease.  Heart disease.  Stroke.  Heart attack.  Not being able to have children (infertility).  Weak bones (osteoporosis) and broken bones (fractures). If you have coughing, wheezing, and  shortness of breath, those symptoms may get better when you quit. You may also get sick less often. If you are pregnant, quitting smoking can help to lower your chances of having a baby of low birth weight. What can I do to help me quit smoking? Talk with your doctor about what can help you quit smoking. Some things you can do (strategies) include:  Quitting smoking totally, instead of slowly cutting back how much you smoke over a period of time.  Going to in-person  counseling. You are more likely to quit if you go to many counseling sessions.  Using resources and support systems, such as:  Online chats with a Social worker.  Phone quitlines.  Printed Furniture conservator/restorer.  Support groups or group counseling.  Text messaging programs.  Mobile phone apps or applications.  Taking medicines. Some of these medicines may have nicotine in them. If you are pregnant or breastfeeding, do not take any medicines to quit smoking unless your doctor says it is okay. Talk with your doctor about counseling or other things that can help you. Talk with your doctor about using more than one strategy at the same time, such as taking medicines while you are also going to in-person counseling. This can help make quitting easier. What things can I do to make it easier to quit? Quitting smoking might feel very hard at first, but there is a lot that you can do to make it easier. Take these steps:  Talk to your family and friends. Ask them to support and encourage you.  Call phone quitlines, reach out to support groups, or work with a Social worker.  Ask people who smoke to not smoke around you.  Avoid places that make you want (trigger) to smoke, such as:  Bars.  Parties.  Smoke-break areas at work.  Spend time with people who do not smoke.  Lower the stress in your life. Stress can make you want to smoke. Try these things to help your stress:  Getting regular exercise.  Deep-breathing  exercises.  Yoga.  Meditating.  Doing a body scan. To do this, close your eyes, focus on one area of your body at a time from head to toe, and notice which parts of your body are tense. Try to relax the muscles in those areas.  Download or buy apps on your mobile phone or tablet that can help you stick to your quit plan. There are many free apps, such as QuitGuide from the [employer] Office manager for Disease Control and Prevention). You can find more support from smokefree.gov and other websites. This information is not intended to replace advice given to you by your health care provider. Make sure you discuss any questions you have with your health care provider. Document Released: 03/25/2009 Document Revised: 01/25/2016 Document Reviewed: 10/13/2014 Elsevier Interactive Patient Education  2017 [employer].

## 2016-06-22 NOTE — Progress Notes (Signed)
Subjective:   Craig Shaffer is a 56 y.o. male who presents for Medicare Annual/Subsequent preventive examination.  Review of Systems:  N/A Cardiac Risk Factors include: advanced age (>51men, >59 women);hypertension;male gender;smoking/ tobacco exposure     Objective:    Vitals: BP (!) 138/92   Pulse 81   Temp 97.6 F (36.4 C) (Oral)   Ht 5\' 9"  (1.753 m)   Wt 156 lb 1.3 oz (70.8 kg)   SpO2 95%   BMI 23.05 kg/m   Body mass index is 23.05 kg/m.  Tobacco History  Smoking Status  . Current Every Day Smoker  . Packs/day: 0.25  . Years: 20.00  . Types: Cigarettes  Smokeless Tobacco  . Never Used    Comment: 4 cigarettes a day, slowly weaning himself off     Ready to quit: Yes Counseling given: Yes   Past Medical History:  Diagnosis Date  . Anemia 2012 MCV 76.2 HB 12.3 CR 1.0-1.24   FERRITIN 14   . Demyelinating disease of central nervous system ([place])   . ED (erectile dysfunction)   . Elevated PSA   . Hypertension   . Nicotine addiction    Past Surgical History:  Procedure Laterality Date  . CHOLECYSTECTOMY  5/07  . COLONOSCOPY  2002 [facility] POLYP  . COLONOSCOPY  JAN 2013 [facility] ANEMIA   H POLYP, IH  . GIVENS CAPSULE STUDY  10/05/2011   Procedure: GIVENS CAPSULE STUDY;  Surgeon: Danie Binder, MD;  Location: [facility] SUITE;  Service: Endoscopy;  Laterality: N/A;  . LAPAROSCOPIC APPENDECTOMY N/A 05/11/2016   Procedure: APPENDECTOMY LAPAROSCOPIC;  Surgeon: Aviva Signs, MD;  Location: [facility] ORS;  Service: General;  Laterality: N/A;  . UPPER GASTROINTESTINAL ENDOSCOPY  JAN 2013 ANEMIA   GASTRITIS, NL DUO Bx   Family History  Problem Relation Age of Onset  . Hypertension Mother   . Cancer Mother 71    metastatized to liver, died at age 50  . Hypertension Sister   . Hypertension Brother   . Hypertension Sister   . Hypertension Sister   . Stroke Sister   . Hypertension Sister   . Heart disease Sister 36    stents in 4 valves, ruptd aorta  .  Hypertension Brother   . Colon cancer Neg Hx    History  Sexual Activity  . Sexual activity: Yes    Outpatient Encounter Prescriptions as of 06/22/2016  Medication Sig  . diltiazem (TAZTIA XT) 360 MG 24 hr capsule TAKE 1 CAPSULE BY MOUTH EVERY DAY  . aspirin (ASPIRIN LOW DOSE) 81 MG EC tablet TAKE 1 TABLET BY MOUTH EVERY DAY (Patient not taking: Reported on 06/22/2016)  . [DISCONTINUED] HYDROcodone-acetaminophen (NORCO) 5-325 MG tablet Take 1-2 tablets by mouth every 4 (four) hours as needed for moderate pain.   No facility-administered encounter medications on file as of 06/22/2016.     Activities of Daily Living In your present state of health, do you have any difficulty performing the following activities: 06/22/2016 05/11/2016  Hearing? N N  Vision? N N  Difficulty concentrating or making decisions? N N  Walking or climbing stairs? N N  Dressing or bathing? N N  Doing errands, shopping? Y N  Preparing Food and eating ? N -  Using the Toilet? N -  In the past six months, have you accidently leaked urine? N -  Do you have problems with loss of bowel control? N -  Managing your Medications? N -  Managing your Finances? N -  Housekeeping or managing your Housekeeping? N -  Some recent data might be hidden    Patient Care Team: Fayrene Helper, MD as PCP - General Danie Binder, MD (Gastroenterology) Irine Seal, MD as Attending Physician (Urology)   Assessment:    Exercise Activities and Dietary recommendations Current Exercise Habits: Home exercise routine, Type of exercise: walking, Time (Minutes): 45, Frequency (Times/Week): 7, Weekly Exercise (Minutes/Week): 315, Intensity: Moderate  Goals    . Quit smoking / using tobacco          Starting 06/22/2016 I would like to continue slowly weaning myself off of cigarettes. By the end of Februrary I would like to be down to 2-3 cigarettes a day.      Fall Risk Fall Risk  06/22/2016 08/10/2015 03/10/2015 04/04/2013  Falls  in the past year? No No No No   Depression Screen PHQ 2/9 Scores 06/22/2016 03/10/2015 04/04/2013  PHQ - 2 Score 0 0 0    Cognitive Function    Normal 6CIT Screen 06/22/2016  What Year? 0 points  What month? 0 points  What time? 0 points  Count back from 20 0 points  Months in reverse 0 points  Repeat phrase 2 points  Total Score 2    Immunization History  Administered Date(s) Administered  . Influenza,inj,Quad PF,36+ Mos 04/04/2013, 03/16/2014, 03/10/2015  . Td 02/18/2004   Screening Tests Health Maintenance  Topic Date Due  . INFLUENZA VACCINE  08/09/2016 (Originally 01/11/2016)  . TETANUS/TDAP  11/02/2017 (Originally 02/17/2014)  . COLONOSCOPY  06/22/2021  . Hepatitis C Screening  Completed  . HIV Screening  Completed      Plan:    I have personally reviewed and addressed the Medicare Annual Wellness questionnaire and have noted the following in the patient's chart:  A. Medical and social history B. Use of alcohol, tobacco or illicit drugs  C. Current medications and supplements D. Functional ability and status E.  Nutritional status F.  Physical activity G. Advance directives H. List of other physicians I.  Hospitalizations, surgeries, and ER visits in previous 12 months J.  [place] to include hearing, vision, cognitive, depression L. Referrals and appointments  In addition, I have reviewed and discussed with patient certain preventive protocols, quality metrics, and best practice recommendations. A written personalized care plan for preventive services as well as general preventive health recommendations were provided to patient.  Signed,   Stormy Fabian, LPN Lead Nurse Health Advisor

## 2016-06-24 ENCOUNTER — Other Ambulatory Visit: Payer: Self-pay | Admitting: Family Medicine

## 2016-08-10 DIAGNOSIS — R972 Elevated prostate specific antigen [PSA]: Secondary | ICD-10-CM | POA: Diagnosis not present

## 2016-08-14 ENCOUNTER — Ambulatory Visit (INDEPENDENT_AMBULATORY_CARE_PROVIDER_SITE_OTHER): Payer: Medicare Other | Admitting: Family Medicine

## 2016-08-14 ENCOUNTER — Encounter: Payer: Self-pay | Admitting: Family Medicine

## 2016-08-14 VITALS — BP 118/80 | HR 88 | Resp 15 | Ht 69.0 in | Wt 155.8 lb

## 2016-08-14 DIAGNOSIS — I10 Essential (primary) hypertension: Secondary | ICD-10-CM

## 2016-08-14 DIAGNOSIS — F528 Other sexual dysfunction not due to a substance or known physiological condition: Secondary | ICD-10-CM

## 2016-08-14 DIAGNOSIS — F17200 Nicotine dependence, unspecified, uncomplicated: Secondary | ICD-10-CM

## 2016-08-14 DIAGNOSIS — Z1211 Encounter for screening for malignant neoplasm of colon: Secondary | ICD-10-CM

## 2016-08-14 LAB — POC HEMOCCULT BLD/STL (OFFICE/1-CARD/DIAGNOSTIC): Fecal Occult Blood, POC: NEGATIVE

## 2016-08-14 MED ORDER — DILTIAZEM HCL ER BEADS 360 MG PO CP24: ORAL_CAPSULE | ORAL | 3 refills | Status: DC

## 2016-08-14 NOTE — Progress Notes (Signed)
   CALEB KRENKE     MRN: [id number]      DOB: [birth year]   HPI [name] is here for follow up and re-evaluation of chronic medical conditions, medication management and review of any available recent lab and radiology data.  Preventive health is updated, specifically  Cancer screening and Immunization.   Questions or concerns regarding consultations or procedures which the PT has had in the interim are  addressed. The PT denies any adverse reactions to current medications since the last visit.  There are no new concerns.  There are no specific complaints   ROS Denies recent fever or chills. Denies sinus pressure, nasal congestion, ear pain or sore throat. Denies chest congestion, productive cough or wheezing. Denies chest pains, palpitations and leg swelling Denies abdominal pain, nausea, vomiting,diarrhea or constipation.   Denies dysuria, frequency, hesitancy or incontinence. Denies joint pain, swelling and limitation in mobility. Denies headaches, seizures, numbness, or tingling. Denies depression, anxiety or insomnia. Denies skin break down or rash.   PE  BP 118/80   Pulse 88   Resp 15   Ht 5\' 9"  (1.753 m)   Wt 155 lb 12.8 oz (70.7 kg)   SpO2 100%   BMI 23.01 kg/m   Patient alert and oriented and in no cardiopulmonary distress.  HEENT: No facial asymmetry, EOMI,   oropharynx pink and moist.  Neck supple no JVD, no mass.  Chest: Clear to auscultation bilaterally.  CVS: S1, S2 no murmurs, no S3.Regular rate.  ABD: Soft non tender.  Rectal; no mass , heme negative stool  Ext: No edema  MS: Adequate ROM spine, shoulders, hips and knees.  Skin: Intact, no ulcerations or rash noted.  Psych: Good eye contact, normal affect. Memory intact not anxious or depressed appearing.  CNS: CN 2-12 intact, power,  normal throughout.no focal deficits noted.   Assessment & Plan  Essential hypertension Controlled, no change in medication DASH diet and commitment  to daily physical activity for a minimum of 30 minutes discussed and encouraged, as a part of hypertension management. The importance of attaining a healthy weight is also discussed.  BP/Weight 08/14/2016 06/22/2016 05/12/2016 05/11/2016 08/10/2015 03/10/2015 [id number]  Systolic BP 123456 [id number] 123456 - [id number]  Diastolic BP 80 78 66 - 84 82 80  Wt. (Lbs) 155.8 156.08 - 151.68 163.4 159.12 166  BMI 23.01 23.05 - 22.4 24.12 23.33 24.5       ERECTILE DYSFUNCTION Uses a pump which is effective  OTHER DEMYELINATING DISEASES OF CNTRL NERV SYS No residual right leg weakness, no longer wears a brace

## 2016-08-14 NOTE — Patient Instructions (Addendum)
F/U in 6 to 7 month, call if you need me sooner  Keep up good health habits  Need to QUIT smoking, call 1 [phone] NOW  No med changes   Thank you  for choosing [facility]. We consider it a privelige to serve you.  Delivering excellent health care in a caring and  compassionate way is our goal.  Partnering with you,  so that together we can achieve this goal is our strategy.     Steps to Quit Smoking Smoking tobacco can be bad for your health. It can also affect almost every organ in your body. Smoking puts you and people around you at risk for many serious long-lasting (chronic) diseases. Quitting smoking is hard, but it is one of the best things that you can do for your health. It is never too late to quit. What are the benefits of quitting smoking? When you quit smoking, you lower your risk for getting serious diseases and conditions. They can include:  Lung cancer or lung disease.  Heart disease.  Stroke.  Heart attack.  Not being able to have children (infertility).  Weak bones (osteoporosis) and broken bones (fractures). If you have coughing, wheezing, and shortness of breath, those symptoms may get better when you quit. You may also get sick less often. If you are pregnant, quitting smoking can help to lower your chances of having a baby of low birth weight. What can I do to help me quit smoking? Talk with your doctor about what can help you quit smoking. Some things you can do (strategies) include:  Quitting smoking totally, instead of slowly cutting back how much you smoke over a period of time.  Going to in-person counseling. You are more likely to quit if you go to many counseling sessions.  Using resources and support systems, such as:  Online chats with a Social worker.  Phone quitlines.  Printed Furniture conservator/restorer.  Support groups or group counseling.  Text messaging programs.  Mobile phone apps or applications.  Taking medicines. Some  of these medicines may have nicotine in them. If you are pregnant or breastfeeding, do not take any medicines to quit smoking unless your doctor says it is okay. Talk with your doctor about counseling or other things that can help you. Talk with your doctor about using more than one strategy at the same time, such as taking medicines while you are also going to in-person counseling. This can help make quitting easier. What things can I do to make it easier to quit? Quitting smoking might feel very hard at first, but there is a lot that you can do to make it easier. Take these steps:  Talk to your family and friends. Ask them to support and encourage you.  Call phone quitlines, reach out to support groups, or work with a Social worker.  Ask people who smoke to not smoke around you.  Avoid places that make you want (trigger) to smoke, such as:  Bars.  Parties.  Smoke-break areas at work.  Spend time with people who do not smoke.  Lower the stress in your life. Stress can make you want to smoke. Try these things to help your stress:  Getting regular exercise.  Deep-breathing exercises.  Yoga.  Meditating.  Doing a body scan. To do this, close your eyes, focus on one area of your body at a time from head to toe, and notice which parts of your body are tense. Try to relax the muscles in  those areas.  Download or buy apps on your mobile phone or tablet that can help you stick to your quit plan. There are many free apps, such as QuitGuide from the [employer] Office manager for Disease Control and Prevention). You can find more support from smokefree.gov and other websites. This information is not intended to replace advice given to you by your health care provider. Make sure you discuss any questions you have with your health care provider. Document Released: 03/25/2009 Document Revised: 01/25/2016 Document Reviewed: 10/13/2014 Elsevier Interactive Patient Education  2017 [employer].

## 2016-08-14 NOTE — Assessment & Plan Note (Signed)
Controlled, no change in medication DASH diet and commitment to daily physical activity for a minimum of 30 minutes discussed and encouraged, as a part of hypertension management. The importance of attaining a healthy weight is also discussed.  BP/Weight 08/14/2016 06/22/2016 05/12/2016 05/11/2016 08/10/2015 03/10/2015 [id number]  Systolic BP 123456 [id number] 123456 - [id number]  Diastolic BP 80 78 66 - 84 82 80  Wt. (Lbs) 155.8 156.08 - 151.68 163.4 159.12 166  BMI 23.01 23.05 - 22.4 24.12 23.33 24.5

## 2016-08-14 NOTE — Assessment & Plan Note (Signed)
Uses a pump which is effective

## 2016-08-14 NOTE — Assessment & Plan Note (Signed)
No residual right leg weakness, no longer wears a brace

## 2016-09-27 ENCOUNTER — Other Ambulatory Visit: Payer: Self-pay | Admitting: Family Medicine

## 2016-12-15 ENCOUNTER — Ambulatory Visit: Payer: Medicare Other | Admitting: Urology

## 2017-02-09 ENCOUNTER — Ambulatory Visit (INDEPENDENT_AMBULATORY_CARE_PROVIDER_SITE_OTHER): Payer: Medicare Other | Admitting: Urology

## 2017-02-09 DIAGNOSIS — N5201 Erectile dysfunction due to arterial insufficiency: Secondary | ICD-10-CM

## 2017-02-09 DIAGNOSIS — R972 Elevated prostate specific antigen [PSA]: Secondary | ICD-10-CM

## 2017-02-09 DIAGNOSIS — N402 Nodular prostate without lower urinary tract symptoms: Secondary | ICD-10-CM | POA: Diagnosis not present

## 2017-03-26 ENCOUNTER — Encounter: Payer: Self-pay | Admitting: Family Medicine

## 2017-03-26 ENCOUNTER — Ambulatory Visit (INDEPENDENT_AMBULATORY_CARE_PROVIDER_SITE_OTHER): Payer: Medicare Other | Admitting: Family Medicine

## 2017-03-26 VITALS — BP 140/80 | HR 77 | Temp 98.6°F | Resp 16 | Ht 69.0 in | Wt 157.2 lb

## 2017-03-26 DIAGNOSIS — I10 Essential (primary) hypertension: Secondary | ICD-10-CM

## 2017-03-26 DIAGNOSIS — F17200 Nicotine dependence, unspecified, uncomplicated: Secondary | ICD-10-CM

## 2017-03-26 DIAGNOSIS — F528 Other sexual dysfunction not due to a substance or known physiological condition: Secondary | ICD-10-CM

## 2017-03-26 NOTE — Assessment & Plan Note (Signed)
Managed by urology 

## 2017-03-26 NOTE — Assessment & Plan Note (Signed)
Not at goal, no med change at this visit DASH diet and commitment to daily physical activity for a minimum of 30 minutes discussed and encouraged, as a part of hypertension management. The importance of attaining a healthy weight is also discussed.  BP/Weight 03/26/2017 08/14/2016 06/22/2016 05/12/2016 05/11/2016 08/10/2015 7/62/8315  Systolic BP 176 160 737 106 - 269 485  Diastolic BP 80 80 78 66 - 84 82  Wt. (Lbs) 157.25 155.8 156.08 - 151.68 163.4 159.12  BMI 23.22 23.01 23.05 - 22.4 24.12 23.33

## 2017-03-26 NOTE — Assessment & Plan Note (Signed)

## 2017-03-26 NOTE — Patient Instructions (Addendum)
Wellness with nurse 2nd week in January  F/U with rectal with MD in 5 months   Quit Date for smoking needs to be by Dec 31 per what you have told me  Please stop soda and smoking as blood pressure is not  Goal  All the best  CBC, fasting lipi and chem 7 in 5 months 1 week before MD visit  Thank you  for choosing [facility]. We consider it a privelige to serve you.  Delivering excellent health care in a caring and  compassionate way is our goal.  Partnering with you,  so that together we can achieve this goal is our strategy.      Steps to Quit Smoking Smoking tobacco can be bad for your health. It can also affect almost every organ in your body. Smoking puts you and people around you at risk for many serious long-lasting (chronic) diseases. Quitting smoking is hard, but it is one of the best things that you can do for your health. It is never too late to quit. What are the benefits of quitting smoking? When you quit smoking, you lower your risk for getting serious diseases and conditions. They can include:  Lung cancer or lung disease.  Heart disease.  Stroke.  Heart attack.  Not being able to have children (infertility).  Weak bones (osteoporosis) and broken bones (fractures).  If you have coughing, wheezing, and shortness of breath, those symptoms may get better when you quit. You may also get sick less often. If you are pregnant, quitting smoking can help to lower your chances of having a baby of low birth weight. What can I do to help me quit smoking? Talk with your doctor about what can help you quit smoking. Some things you can do (strategies) include:  Quitting smoking totally, instead of slowly cutting back how much you smoke over a period of time.  Going to in-person counseling. You are more likely to quit if you go to many counseling sessions.  Using resources and support systems, such as: ? Database administrator with a Social worker. ? Phone  quitlines. ? Careers information officer. ? Support groups or group counseling. ? Text messaging programs. ? Mobile phone apps or applications.  Taking medicines. Some of these medicines may have nicotine in them. If you are pregnant or breastfeeding, do not take any medicines to quit smoking unless your doctor says it is okay. Talk with your doctor about counseling or other things that can help you.  Talk with your doctor about using more than one strategy at the same time, such as taking medicines while you are also going to in-person counseling. This can help make quitting easier. What things can I do to make it easier to quit? Quitting smoking might feel very hard at first, but there is a lot that you can do to make it easier. Take these steps:  Talk to your family and friends. Ask them to support and encourage you.  Call phone quitlines, reach out to support groups, or work with a Social worker.  Ask people who smoke to not smoke around you.  Avoid places that make you want (trigger) to smoke, such as: ? Bars. ? Parties. ? Smoke-break areas at work.  Spend time with people who do not smoke.  Lower the stress in your life. Stress can make you want to smoke. Try these things to help your stress: ? Getting regular exercise. ? Deep-breathing exercises. ? Yoga. ? Meditating. ? Doing a body  scan. To do this, close your eyes, focus on one area of your body at a time from head to toe, and notice which parts of your body are tense. Try to relax the muscles in those areas.  Download or buy apps on your mobile phone or tablet that can help you stick to your quit plan. There are many free apps, such as QuitGuide from the [employer] Office manager for Disease Control and Prevention). You can find more support from smokefree.gov and other websites.  This information is not intended to replace advice given to you by your health care provider. Make sure you discuss any questions you have with your health care  provider. Document Released: 03/25/2009 Document Revised: 01/25/2016 Document Reviewed: 10/13/2014 Elsevier Interactive Patient Education  2018 [employer].  Steps to Quit Smoking Smoking tobacco can be bad for your health. It can also affect almost every organ in your body. Smoking puts you and people around you at risk for many serious long-lasting (chronic) diseases. Quitting smoking is hard, but it is one of the best things that you can do for your health. It is never too late to quit. What are the benefits of quitting smoking? When you quit smoking, you lower your risk for getting serious diseases and conditions. They can include:  Lung cancer or lung disease.  Heart disease.  Stroke.  Heart attack.  Not being able to have children (infertility).  Weak bones (osteoporosis) and broken bones (fractures).  If you have coughing, wheezing, and shortness of breath, those symptoms may get better when you quit. You may also get sick less often. If you are pregnant, quitting smoking can help to lower your chances of having a baby of low birth weight. What can I do to help me quit smoking? Talk with your doctor about what can help you quit smoking. Some things you can do (strategies) include:  Quitting smoking totally, instead of slowly cutting back how much you smoke over a period of time.  Going to in-person counseling. You are more likely to quit if you go to many counseling sessions.  Using resources and support systems, such as: ? Database administrator with a Social worker. ? Phone quitlines. ? Careers information officer. ? Support groups or group counseling. ? Text messaging programs. ? Mobile phone apps or applications.  Taking medicines. Some of these medicines may have nicotine in them. If you are pregnant or breastfeeding, do not take any medicines to quit smoking unless your doctor says it is okay. Talk with your doctor about counseling or other things that can help you.  Talk with  your doctor about using more than one strategy at the same time, such as taking medicines while you are also going to in-person counseling. This can help make quitting easier. What things can I do to make it easier to quit? Quitting smoking might feel very hard at first, but there is a lot that you can do to make it easier. Take these steps:  Talk to your family and friends. Ask them to support and encourage you.  Call phone quitlines, reach out to support groups, or work with a Social worker.  Ask people who smoke to not smoke around you.  Avoid places that make you want (trigger) to smoke, such as: ? Bars. ? Parties. ? Smoke-break areas at work.  Spend time with people who do not smoke.  Lower the stress in your life. Stress can make you want to smoke. Try these things to help your stress: ?  Getting regular exercise. ? Deep-breathing exercises. ? Yoga. ? Meditating. ? Doing a body scan. To do this, close your eyes, focus on one area of your body at a time from head to toe, and notice which parts of your body are tense. Try to relax the muscles in those areas.  Download or buy apps on your mobile phone or tablet that can help you stick to your quit plan. There are many free apps, such as QuitGuide from the [employer] Office manager for Disease Control and Prevention). You can find more support from smokefree.gov and other websites.  This information is not intended to replace advice given to you by your health care provider. Make sure you discuss any questions you have with your health care provider. Document Released: 03/25/2009 Document Revised: 01/25/2016 Document Reviewed: 10/13/2014 Elsevier Interactive Patient Education  2018 [employer].

## 2017-03-26 NOTE — Progress Notes (Signed)
   DAYLAN JUHNKE     MRN: [id number]      DOB: 02-18-1961   HPI [name] is here for follow up and re-evaluation of chronic medical conditions, medication management and review of any available recent lab and radiology data.  Preventive health is updated, specifically  Cancer screening and Immunization.   Questions or concerns regarding consultations or procedures which the PT has had in the interim are  addressed. The PT denies any adverse reactions to current medications since the last visit.  There are no new concerns.  There are no specific complaints   ROS Denies recent fever or chills. Denies sinus pressure, nasal congestion, ear pain or sore throat. Denies chest congestion, productive cough or wheezing. Denies chest pains, palpitations and leg swelling Denies abdominal pain, nausea, vomiting,diarrhea or constipation.   Denies dysuria, frequency, hesitancy or incontinence. Denies joint pain, swelling and limitation in mobility. Denies headaches, seizures, numbness, or tingling. Denies depression, anxiety or insomnia. Denies skin break down or rash.   PE  BP 140/80   Pulse 77   Temp 98.6 F (37 C) (Other (Comment))   Resp 16   Ht 5\' 9"  (1.753 m)   Wt 157 lb 4 oz (71.3 kg)   SpO2 98%   BMI 23.22 kg/m   Patient alert and oriented and in no cardiopulmonary distress.  HEENT: No facial asymmetry, EOMI,   oropharynx pink and moist.  Neck supple no JVD, no mass.  Chest: Clear to auscultation bilaterally.  CVS: S1, S2 no murmurs, no S3.Regular rate.  ABD: Soft non tender.   Ext: No edema  MS: Adequate ROM spine, shoulders, hips and knees.  Skin: Intact, no ulcerations or rash noted.  Psych: Good eye contact, normal affect. Memory intact not anxious or depressed appearing.  CNS: CN 2-12 intact, power,  normal throughout.no focal deficits noted.   Assessment & Plan  Essential hypertension Not at goal, no med change at this visit DASH diet and  commitment to daily physical activity for a minimum of 30 minutes discussed and encouraged, as a part of hypertension management. The importance of attaining a healthy weight is also discussed.  BP/Weight 03/26/2017 08/14/2016 06/22/2016 05/12/2016 05/11/2016 08/10/2015 5/68/1275  Systolic BP 170 017 494 496 - 759 163  Diastolic BP 80 80 78 66 - 84 82  Wt. (Lbs) 157.25 155.8 156.08 - 151.68 163.4 159.12  BMI 23.22 23.01 23.05 - 22.4 24.12 23.33       NICOTINE ADDICTION Patient is asked and  confirms current  Nicotine use.  Five to seven minutes of time is spent in counseling the patient of the need to quit smoking  Advice to quit is delivered clearly specifically in reducing the risk of developing heart disease, having a stroke, or of developing all types of cancer, especially lung and oral cancer. Improvement in breathing and exercise tolerance and quality of life is also discussed, as is the economic benefit.  Assessment of willingness to quit or to make an attempt to quit is made and documented  Assistance in quit attempt is made with several and varied options presented, based on patient's desire and need. These include  literature, local classes available, 1800 QUIT NOW number, OTC and prescription medication.  The GOAL to be NICOTINE FREE is re emphasized.  The patient has set a personal goal of either reduction or discontinuation and follow up is arranged between 6 an 16 weeks.    ERECTILE DYSFUNCTION Managed by urology

## 2017-05-15 ENCOUNTER — Encounter: Payer: Self-pay | Admitting: Family Medicine

## 2017-06-19 ENCOUNTER — Ambulatory Visit: Payer: Medicare Other

## 2017-06-25 ENCOUNTER — Ambulatory Visit: Payer: Medicare Other

## 2017-07-02 ENCOUNTER — Ambulatory Visit: Payer: Medicare Other

## 2017-07-18 ENCOUNTER — Ambulatory Visit: Payer: Medicare Other

## 2017-07-18 VITALS — BP 132/76 | HR 64 | Temp 99.2°F | Resp 16 | Ht 69.0 in | Wt 159.1 lb

## 2017-07-18 DIAGNOSIS — Z0000 Encounter for general adult medical examination without abnormal findings: Secondary | ICD-10-CM

## 2017-07-18 NOTE — Progress Notes (Signed)
Subjective:   Craig Shaffer is a 57 y.o. male who presents for Medicare Annual/Subsequent preventive examination.  Review of Systems:   Cardiac Risk Factors include: none     Objective:    Vitals: BP 132/76 (BP Location: Left Arm, Patient Position: Sitting, Cuff Size: Normal)   Pulse 64   Temp 99.2 F (37.3 C) (Temporal)   Resp 16   Ht 5\' 9"  (1.753 m)   Wt 159 lb 1.9 oz (72.2 kg)   SpO2 98%   BMI 23.50 kg/m   Body mass index is 23.5 kg/m.  Advanced Directives 07/18/2017 06/22/2016 05/11/2016 05/11/2016 05/11/2016 05/10/2016 06/23/2011  Does Patient Have a Medical Advance Directive? No No - Yes No No Patient does not have advance directive;Patient would not like information  Does patient want to make changes to medical advance directive? - No - Patient declined - - - - -  Would patient like information on creating a medical advance directive? No - Patient declined - No - Patient declined - No - Patient declined - -    Tobacco Social History   Tobacco Use  Smoking Status Current Every Day Smoker  . Packs/day: 0.25  . Years: 20.00  . Pack years: 5.00  . Types: Cigarettes  Smokeless Tobacco Never Used  Tobacco Comment   4 cigarettes a day, slowly weaning himself off     Ready to quit: No Counseling given: Yes Comment: 4 cigarettes a day, slowly weaning himself off   Clinical Intake:     Pain : No/denies pain Pain Score: 0-No pain     Diabetes: No  How often do you need to have someone help you when you read instructions, pamphlets, or other written materials from your doctor or pharmacy?: 1 - Never What is the last grade level you completed in school?: 12 grade  Interpreter Needed?: No     Past Medical History:  Diagnosis Date  . Anemia 2012 MCV 76.2 HB 12.3 CR 1.0-1.24   FERRITIN 14   . Demyelinating disease of central nervous system ([place])   . ED (erectile dysfunction)   . Elevated PSA   . GERD (gastroesophageal reflux disease)   .  Hypertension   . Nicotine addiction    Past Surgical History:  Procedure Laterality Date  . APPENDECTOMY  04/2016  . CHOLECYSTECTOMY  5/07  . COLONOSCOPY  2002 [facility] POLYP  . COLONOSCOPY  JAN 2013 [facility] ANEMIA   H POLYP, IH  . GIVENS CAPSULE STUDY  10/05/2011   Procedure: GIVENS CAPSULE STUDY;  Surgeon: Danie Binder, MD;  Location: [facility] ENDO SUITE;  Service: Endoscopy;  Laterality: N/A;  . LAPAROSCOPIC APPENDECTOMY N/A 05/11/2016   Procedure: APPENDECTOMY LAPAROSCOPIC;  Surgeon: Aviva Signs, MD;  Location: [facility] ORS;  Service: General;  Laterality: N/A;  . UPPER GASTROINTESTINAL ENDOSCOPY  JAN 2013 ANEMIA   GASTRITIS, NL DUO Bx   Family History  Problem Relation Age of Onset  . Hypertension Mother   . Cancer Mother 83       metastatized to liver, died at age 68  . Hypertension Sister   . Hypertension Brother   . Hypertension Sister   . Hypertension Sister   . Stroke Sister   . Hypertension Sister   . Heart disease Sister 45       stents in 4 valves, ruptd aorta  . Hypertension Brother   . Colon cancer Neg Hx    Social History   Socioeconomic History  .  Marital status: Single    Spouse name: None  . Number of children: 0  . Years of education: 35  . Highest education level: 12th grade  Social Needs  . Financial resource strain: Not hard at all  . Food insecurity - worry: Never true  . Food insecurity - inability: Never true  . Transportation needs - medical: No  . Transportation needs - non-medical: No  Occupational History    Employer: UNEMPLOYED  Tobacco Use  . Smoking status: Current Every Day Smoker    Packs/day: 0.25    Years: 20.00    Pack years: 5.00    Types: Cigarettes  . Smokeless tobacco: Never Used  . Tobacco comment: 4 cigarettes a day, slowly weaning himself off  Substance and Sexual Activity  . Alcohol use: No    Alcohol/week: 0.0 oz  . Drug use: No  . Sexual activity: Yes  Other Topics Concern  . None  Social History Narrative    Lives with sister and aunt.    Outpatient Encounter Medications as of 07/18/2017  Medication Sig  . aspirin (ASPIRIN LOW DOSE) 81 MG EC tablet TAKE 1 TABLET BY MOUTH EVERY DAY  . diltiazem (TIAZAC) 360 MG 24 hr capsule TAKE 1 CAPSULE BY MOUTH EVERY DAY  . [DISCONTINUED] diltiazem (TIAZAC) 360 MG 24 hr capsule TAKE 1 CAPSULE BY MOUTH EVERY DAY (Patient not taking: Reported on 03/26/2017)   No facility-administered encounter medications on file as of 07/18/2017.     Activities of Daily Living In your present state of health, do you have any difficulty performing the following activities: 07/18/2017  Hearing? N  Vision? N  Difficulty concentrating or making decisions? N  Walking or climbing stairs? N  Dressing or bathing? N  Doing errands, shopping? N  Preparing Food and eating ? N  Using the Toilet? N  In the past six months, have you accidently leaked urine? N  Do you have problems with loss of bowel control? N  Managing your Medications? N  Managing your Finances? N  Housekeeping or managing your Housekeeping? N  Some recent data might be hidden    Patient Care Team: Fayrene Helper, MD as PCP - [facility], Marga Melnick, MD (Gastroenterology) Irine Seal, MD as Attending Physician (Urology)   Assessment:   This is a routine wellness examination for Craig Shaffer.  Exercise Activities and Dietary recommendations Exercise limited by: None identified  Goals    None      Fall Risk Fall Risk  07/18/2017 06/22/2016 08/10/2015 03/10/2015 04/04/2013  Falls in the past year? No No No No No   Is the patient's home free of loose throw rugs in walkways, pet beds, electrical cords, etc?  yes      Grab bars in the bathroom? None needed      Handrails on the stairs?   yes      Adequate lighting?   yes    Depression Screen PHQ 2/9 Scores 07/18/2017 06/22/2016 03/10/2015 04/04/2013  PHQ - 2 Score 0 0 0 0    Cognitive Function     6CIT Screen 07/18/2017 06/22/2016  What Year? 0 points 0  points  What month? 0 points 0 points  What time? 0 points 0 points  Count back from 20 0 points 0 points  Months in reverse 0 points 0 points  Repeat phrase 0 points 2 points  Total Score 0 2    Immunization History  Administered Date(s) Administered  . Influenza,inj,Quad PF,6+ Mos 04/04/2013,  03/16/2014, 03/10/2015  . Td 02/18/2004    Qualifies for Shingles Vaccine? refuses  Screening Tests Health Maintenance  Topic Date Due  . TETANUS/TDAP  11/02/2017 (Originally 02/17/2014)  . COLONOSCOPY  06/22/2021  . INFLUENZA VACCINE  Completed  . Hepatitis C Screening  Completed  . HIV Screening  Completed   Cancer Screenings: Lung: Low Dose CT Chest recommended if Age 82-80 years, 30 pack-year currently smoking OR have quit w/in 15years. Patient  Does  qualify. Colorectal: done  Additional Screenings: Hepatitis B/HIV/Syphillis: Hepatitis C Screening:     Plan:     I have personally reviewed and noted the following in the patient's chart:   . Medical and social history . Use of alcohol, tobacco or illicit drugs  . Current medications and supplements . Functional ability and status . Nutritional status . Physical activity . Advanced directives . List of other physicians . Hospitalizations, surgeries, and ER visits in previous 12 months . Vitals . Screenings to include cognitive, depression, and falls . Referrals and appointments  In addition, I have reviewed and discussed with patient certain preventive protocols, quality metrics, and best practice recommendations. A written personalized care plan for preventive services as well as general preventive health recommendations were provided to patient.     Ova Freshwater, LPN  [id number]

## 2017-07-18 NOTE — Patient Instructions (Signed)
Craig Shaffer , Thank you for taking time to come for your Medicare Wellness Visit. I appreciate your ongoing commitment to your health goals. Please review the following plan we discussed and let me know if I can assist you in the future.   Screening recommendations/referrals: Colonoscopy: done Recommended yearly ophthalmology/optometry visit for glaucoma screening and checkup Recommended yearly dental visit for hygiene and checkup  Vaccinations: Influenza vaccine: refused Pneumococcal vaccine: refused Tdap vaccine: refused Shingles vaccine: refused  Advanced directives: refused  Conditions/risks identified: none  Next appointment: 08/27/17  Preventive Care 40-64 Years, Male Preventive care refers to lifestyle choices and visits with your health care provider that can promote health and wellness. What does preventive care include?  A yearly physical exam. This is also called an annual well check.  Dental exams once or twice a year.  Routine eye exams. Ask your health care provider how often you should have your eyes checked.  Personal lifestyle choices, including:  Daily care of your teeth and gums.  Regular physical activity.  Eating a healthy diet.  Avoiding tobacco and drug use.  Limiting alcohol use.  Practicing safe sex.  Taking low-dose aspirin every day starting at age 25. What happens during an annual well check? The services and screenings done by your health care provider during your annual well check will depend on your age, overall health, lifestyle risk factors, and family history of disease. Counseling  Your health care provider may ask you questions about your:  Alcohol use.  Tobacco use.  Drug use.  Emotional well-being.  Home and relationship well-being.  Sexual activity.  Eating habits.  Work and work Statistician. Screening  You may have the following tests or measurements:  Height, weight, and BMI.  Blood pressure.  Lipid and  cholesterol levels. These may be checked every 5 years, or more frequently if you are over 63 years old.  Skin check.  Lung cancer screening. You may have this screening every year starting at age 71 if you have a 30-pack-year history of smoking and currently smoke or have quit within the past 15 years.  Fecal occult blood test (FOBT) of the stool. You may have this test every year starting at age 53.  Flexible sigmoidoscopy or colonoscopy. You may have a sigmoidoscopy every 5 years or a colonoscopy every 10 years starting at age 91.  Prostate cancer screening. Recommendations will vary depending on your family history and other risks.  Hepatitis C blood test.  Hepatitis B blood test.  Sexually transmitted disease (STD) testing.  Diabetes screening. This is done by checking your blood sugar (glucose) after you have not eaten for a while (fasting). You may have this done every 1-3 years. Discuss your test results, treatment options, and if necessary, the need for more tests with your health care provider. Vaccines  Your health care provider may recommend certain vaccines, such as:  Influenza vaccine. This is recommended every year.  Tetanus, diphtheria, and acellular pertussis (Tdap, Td) vaccine. You may need a Td booster every 10 years.  Zoster vaccine. You may need this after age 83.  Pneumococcal 13-valent conjugate (PCV13) vaccine. You may need this if you have certain conditions and have not been vaccinated.  Pneumococcal polysaccharide (PPSV23) vaccine. You may need one or two doses if you smoke cigarettes or if you have certain conditions. Talk to your health care provider about which screenings and vaccines you need and how often you need them. This information is not intended to replace advice  given to you by your health care provider. Make sure you discuss any questions you have with your health care provider. Document Released: 06/25/2015 Document Revised: 02/16/2016  Document Reviewed: 03/30/2015 Elsevier Interactive Patient Education  2017 [place] Prevention in the Home Falls can cause injuries. They can happen to people of all ages. There are many things you can do to make your home safe and to help prevent falls. What can I do on the outside of my home?  Regularly fix the edges of walkways and driveways and fix any cracks.  Remove anything that might make you trip as you walk through a door, such as a raised step or threshold.  Trim any bushes or trees on the path to your home.  Use bright outdoor lighting.  Clear any walking paths of anything that might make someone trip, such as rocks or tools.  Regularly check to see if handrails are loose or broken. Make sure that both sides of any steps have handrails.  Any raised decks and porches should have guardrails on the edges.  Have any leaves, snow, or ice cleared regularly.  Use sand or salt on walking paths during winter.  Clean up any spills in your garage right away. This includes oil or grease spills. What can I do in the bathroom?  Use night lights.  Install grab bars by the toilet and in the tub and shower. Do not use towel bars as grab bars.  Use non-skid mats or decals in the tub or shower.  If you need to sit down in the shower, use a plastic, non-slip stool.  Keep the floor dry. Clean up any water that spills on the floor as soon as it happens.  Remove soap buildup in the tub or shower regularly.  Attach bath mats securely with double-sided non-slip rug tape.  Do not have throw rugs and other things on the floor that can make you trip. What can I do in the bedroom?  Use night lights.  Make sure that you have a light by your bed that is easy to reach.  Do not use any sheets or blankets that are too big for your bed. They should not hang down onto the floor.  Have a firm chair that has side arms. You can use this for support while you get dressed.  Do not  have throw rugs and other things on the floor that can make you trip. What can I do in the kitchen?  Clean up any spills right away.  Avoid walking on wet floors.  Keep items that you use a lot in easy-to-reach places.  If you need to reach something above you, use a strong step stool that has a grab bar.  Keep electrical cords out of the way.  Do not use floor polish or wax that makes floors slippery. If you must use wax, use non-skid floor wax.  Do not have throw rugs and other things on the floor that can make you trip. What can I do with my stairs?  Do not leave any items on the stairs.  Make sure that there are handrails on both sides of the stairs and use them. Fix handrails that are broken or loose. Make sure that handrails are as long as the stairways.  Check any carpeting to make sure that it is firmly attached to the stairs. Fix any carpet that is loose or worn.  Avoid having throw rugs at the top or bottom of  the stairs. If you do have throw rugs, attach them to the floor with carpet tape.  Make sure that you have a light switch at the top of the stairs and the bottom of the stairs. If you do not have them, ask someone to add them for you. What else can I do to help prevent falls?  Wear shoes that:  Do not have high heels.  Have rubber bottoms.  Are comfortable and fit you well.  Are closed at the toe. Do not wear sandals.  If you use a stepladder:  Make sure that it is fully opened. Do not climb a closed stepladder.  Make sure that both sides of the stepladder are locked into place.  Ask someone to hold it for you, if possible.  Clearly mark and make sure that you can see:  Any grab bars or handrails.  First and last steps.  Where the edge of each step is.  Use tools that help you move around (mobility aids) if they are needed. These include:  Canes.  Walkers.  Scooters.  Crutches.  Turn on the lights when you go into a dark area. Replace  any light bulbs as soon as they burn out.  Set up your furniture so you have a clear path. Avoid moving your furniture around.  If any of your floors are uneven, fix them.  If there are any pets around you, be aware of where they are.  Review your medicines with your doctor. Some medicines can make you feel dizzy. This can increase your chance of falling. Ask your doctor what other things that you can do to help prevent falls. This information is not intended to replace advice given to you by your health care provider. Make sure you discuss any questions you have with your health care provider. Document Released: 03/25/2009 Document Revised: 11/04/2015 Document Reviewed: 07/03/2014 Elsevier Interactive Patient Education  2017 [place] for Adults  A healthy lifestyle and preventive care can promote health and wellness. Preventive health guidelines for adults include the following key practices.  . A routine yearly physical is a good way to check with your health care provider about your health and preventive screening. It is a chance to share any concerns and updates on your health and to receive a thorough exam.  . Visit your dentist for a routine exam and preventive care every 6 months. Brush your teeth twice a day and floss once a day. Good oral hygiene prevents tooth decay and gum disease.  . The frequency of eye exams is based on your age, health, family medical history, use  of contact lenses, and other factors. Follow your health care provider's ecommendations for frequency of eye exams.  . Eat a healthy diet. Foods like vegetables, fruits, whole grains, low-fat dairy products, and lean protein foods contain the nutrients you need without too many calories. Decrease your intake of foods high in solid fats, added sugars, and salt. Eat the right amount of calories for you. Get information about a proper diet from your health care provider, if necessary.  . Regular  physical exercise is one of the most important things you can do for your health. Most adults should get at least 150 minutes of moderate-intensity exercise (any activity that increases your heart rate and causes you to sweat) each week. In addition, most adults need muscle-strengthening exercises on 2 or more days a week.  Silver Sneakers may be a benefit available to you. To determine  eligibility, you may visit the website: www.silversneakers.com or contact program at 6603648646 Mon-Fri between 8AM-8PM.   . Maintain a healthy weight. The body mass index (BMI) is a screening tool to identify possible weight problems. It provides an estimate of body fat based on height and weight. Your health care provider can find your BMI and can help you achieve or maintain a healthy weight.   For adults 20 years and older: ? A BMI below 18.5 is considered underweight. ? A BMI of 18.5 to 24.9 is normal. ? A BMI of 25 to 29.9 is considered overweight. ? A BMI of 30 and above is considered obese.   . Maintain normal blood lipids and cholesterol levels by exercising and minimizing your intake of saturated fat. Eat a balanced diet with plenty of fruit and vegetables. Blood tests for lipids and cholesterol should begin at age 80 and be repeated every 5 years. If your lipid or cholesterol levels are high, you are over 50, or you are at high risk for heart disease, you may need your cholesterol levels checked more frequently. Ongoing high lipid and cholesterol levels should be treated with medicines if diet and exercise are not working.  . If you smoke, find out from your health care provider how to quit. If you do not use tobacco, please do not start.  . If you choose to drink alcohol, please do not consume more than 2 drinks per day. One drink is considered to be 12 ounces (355 mL) of beer, 5 ounces (148 mL) of wine, or 1.5 ounces (44 mL) of liquor.  . If you are 74-28 years old, ask your health care provider if  you should take aspirin to prevent strokes.  . Use sunscreen. Apply sunscreen liberally and repeatedly throughout the day. You should seek shade when your shadow is shorter than you. Protect yourself by wearing long sleeves, pants, a wide-brimmed hat, and sunglasses year round, whenever you are outdoors.  . Once a month, do a whole body skin exam, using a mirror to look at the skin on your back. Tell your health care provider of new moles, moles that have irregular borders, moles that are larger than a pencil eraser, or moles that have changed in shape or color.

## 2017-08-27 ENCOUNTER — Ambulatory Visit (INDEPENDENT_AMBULATORY_CARE_PROVIDER_SITE_OTHER): Payer: Medicare Other | Admitting: Family Medicine

## 2017-08-27 ENCOUNTER — Encounter: Payer: Self-pay | Admitting: Family Medicine

## 2017-08-27 VITALS — BP 124/82 | HR 73 | Resp 16 | Ht 69.0 in | Wt 159.0 lb

## 2017-08-27 DIAGNOSIS — F528 Other sexual dysfunction not due to a substance or known physiological condition: Secondary | ICD-10-CM | POA: Diagnosis not present

## 2017-08-27 DIAGNOSIS — I10 Essential (primary) hypertension: Secondary | ICD-10-CM

## 2017-08-27 DIAGNOSIS — F17200 Nicotine dependence, unspecified, uncomplicated: Secondary | ICD-10-CM | POA: Diagnosis not present

## 2017-08-27 DIAGNOSIS — F17210 Nicotine dependence, cigarettes, uncomplicated: Secondary | ICD-10-CM

## 2017-08-27 LAB — COMPLETE METABOLIC PANEL WITH GFR
AG RATIO: 1.1 (calc) (ref 1.0–2.5)
ALBUMIN MSPROF: 4 g/dL (ref 3.6–5.1)
ALKALINE PHOSPHATASE (APISO): 92 U/L (ref 40–115)
ALT: 16 U/L (ref 9–46)
AST: 18 U/L (ref 10–35)
BILIRUBIN TOTAL: 0.4 mg/dL (ref 0.2–1.2)
BUN: 12 mg/dL (ref 7–25)
CHLORIDE: 103 mmol/L (ref 98–110)
CO2: 30 mmol/L (ref 20–32)
Calcium: 9.2 mg/dL (ref 8.6–10.3)
Creat: 1.05 mg/dL (ref 0.70–1.33)
GFR, EST AFRICAN AMERICAN: 90 mL/min/{1.73_m2} (ref >=60)
GFR, Est Non African American: 78 mL/min/{1.73_m2} (ref >=60)
GLOBULIN: 3.8 g/dL — AB (ref 1.9–3.7)
GLUCOSE: 82 mg/dL (ref 65–99)
POTASSIUM: 4.3 mmol/L (ref 3.5–5.3)
SODIUM: 138 mmol/L (ref 135–146)
TOTAL PROTEIN: 7.8 g/dL (ref 6.1–8.1)

## 2017-08-27 LAB — CBC
HCT: 38.9 % (ref 38.5–50.0)
HEMOGLOBIN: 13.4 g/dL (ref 13.2–17.1)
MCH: 29.3 pg (ref 27.0–33.0)
MCHC: 34.4 g/dL (ref 32.0–36.0)
MCV: 84.9 fL (ref 80.0–100.0)
MPV: 11.5 fL (ref 7.5–12.5)
Platelets: 247 10*3/uL (ref 140–400)
RBC: 4.58 10*6/uL (ref 4.20–5.80)
RDW: 14.3 % (ref 11.0–15.0)
WBC: 6.1 10*3/uL (ref 3.8–10.8)

## 2017-08-27 LAB — LIPID PANEL
CHOLESTEROL: 184 mg/dL (ref <200)
HDL: 47 mg/dL (ref >40)
LDL Cholesterol (Calc): 119 mg/dL (calc) — ABNORMAL HIGH
NON-HDL CHOLESTEROL (CALC): 137 mg/dL — AB (ref <130)
TRIGLYCERIDES: 83 mg/dL (ref <150)
Total CHOL/HDL Ratio: 3.9 (calc) (ref <5.0)

## 2017-08-27 NOTE — Progress Notes (Signed)
   Craig Shaffer     MRN: [id number]      DOB: 10-08-1960   HPI Craig Shaffer is here for follow up and re-evaluation of chronic medical conditions, medication management and review of any available recent lab and radiology data.  Preventive health is updated, specifically  Cancer screening and Immunization.   Questions or concerns regarding consultations or procedures which the PT has had in the interim are  addressed. The PT denies any adverse reactions to current medications since the last visit.  There are no new concerns.  There are no specific complaints   ROS Denies recent fever or chills. Denies sinus pressure, nasal congestion, ear pain or sore throat. Denies chest congestion, productive cough or wheezing. Denies chest pains, palpitations and leg swelling Denies abdominal pain, nausea, vomiting,diarrhea or constipation.   Denies dysuria, frequency, hesitancy or incontinence. Denies joint pain, swelling and limitation in mobility. Denies headaches, seizures, numbness, or tingling. Denies depression, anxiety or insomnia. Denies skin break down or rash.   PE  BP 124/82   Pulse 73   Resp 16   Ht 5\' 9"  (1.753 m)   Wt 159 lb (72.1 kg)   SpO2 98%   BMI 23.48 kg/m   Patient alert and oriented and in no cardiopulmonary distress.  HEENT: No facial asymmetry, EOMI,   oropharynx pink and moist.  Neck supple no JVD, no mass.  Chest: Clear to auscultation bilaterally.  CVS: S1, S2 no murmurs, no S3.Regular rate.  ABD: Soft non tender.   Ext: No edema  MS: Adequate ROM spine, shoulders, hips and knees.  Skin: Intact, no ulcerations or rash noted.  Psych: Good eye contact, normal affect. Memory intact not anxious or depressed appearing.  CNS: CN 2-12 intact, power,  normal throughout.no focal deficits noted.   Assessment & Plan  Essential hypertension Controlled, no change in medication DASH diet and commitment to daily physical activity for a minimum of 30  minutes discussed and encouraged, as a part of hypertension management. The importance of attaining a healthy weight is also discussed.  BP/Weight 08/27/2017 07/18/2017 03/26/2017 08/14/2016 06/22/2016 05/12/2016 65/53/7482  Systolic BP 707 867 544 920 100 712 -  Diastolic BP 82 76 80 80 78 66 -  Wt. (Lbs) 159 159.12 157.25 155.8 156.08 - 151.68  BMI 23.48 23.5 23.22 23.01 23.05 - 22.4       NICOTINE ADDICTION Ask: 3 cigarettes daily in 08/2017 Assess: not yet ready to set a quit date,  Advised : need to quit to reduce risk of heart disease, stroke , all types of cancer Arrange : f/u 5 months Advised  Resources 1800 QUIT Now, and community resources available Coached for 5 minutes  ERECTILE DYSFUNCTION Continue use of pump

## 2017-08-27 NOTE — Assessment & Plan Note (Signed)
Continue use of pump

## 2017-08-27 NOTE — Assessment & Plan Note (Signed)
Ask: 3 cigarettes daily in 08/2017 Assess: not yet ready to set a quit date,  Advised : need to quit to reduce risk of heart disease, stroke , all types of cancer Arrange : f/u 5 months Advised  Resources 1800 QUIT Now, and community resources available Coached for 5 minutes

## 2017-08-27 NOTE — Assessment & Plan Note (Signed)
Controlled, no change in medication DASH diet and commitment to daily physical activity for a minimum of 30 minutes discussed and encouraged, as a part of hypertension management. The importance of attaining a healthy weight is also discussed.  BP/Weight 08/27/2017 07/18/2017 03/26/2017 08/14/2016 06/22/2016 05/12/2016 24/46/9507  Systolic BP 225 750 518 335 825 189 -  Diastolic BP 82 76 80 80 78 66 -  Wt. (Lbs) 159 159.12 157.25 155.8 156.08 - 151.68  BMI 23.48 23.5 23.22 23.01 23.05 - 22.4

## 2017-08-27 NOTE — Patient Instructions (Addendum)
F/u with rectal end august , call if you need me before  Labs today, will mail letter or call with result  Please continue to seriously consider converting cigarette $$ into something not harmful to your body  Please start once daily aspirin  Blood pressure is excellent  Thank you  for choosing [facility]. We consider it a privelige to serve you.  Delivering excellent health care in a caring and  compassionate way is our goal.  Partnering with you,  so that together we can achieve this goal is our strategy.

## 2017-10-02 ENCOUNTER — Other Ambulatory Visit: Payer: Self-pay | Admitting: Family Medicine

## 2018-01-04 ENCOUNTER — Other Ambulatory Visit: Payer: Self-pay | Admitting: Family Medicine

## 2018-01-31 ENCOUNTER — Encounter: Payer: Self-pay | Admitting: Family Medicine

## 2018-01-31 ENCOUNTER — Other Ambulatory Visit: Payer: Self-pay

## 2018-01-31 ENCOUNTER — Other Ambulatory Visit: Payer: Self-pay | Admitting: Family Medicine

## 2018-01-31 ENCOUNTER — Ambulatory Visit (INDEPENDENT_AMBULATORY_CARE_PROVIDER_SITE_OTHER): Payer: Medicare Other | Admitting: Family Medicine

## 2018-01-31 VITALS — BP 120/72 | HR 79 | Resp 12 | Ht 69.0 in | Wt 156.1 lb

## 2018-01-31 DIAGNOSIS — I10 Essential (primary) hypertension: Secondary | ICD-10-CM

## 2018-01-31 DIAGNOSIS — F17200 Nicotine dependence, unspecified, uncomplicated: Secondary | ICD-10-CM | POA: Diagnosis not present

## 2018-01-31 MED ORDER — RANITIDINE HCL 300 MG PO TABS: ORAL_TABLET | ORAL | 1 refills | Status: DC

## 2018-01-31 MED ORDER — MONTELUKAST SODIUM 10 MG PO TABS: 10.0000 mg | ORAL_TABLET | Freq: Every day | ORAL | 3 refills | Status: DC

## 2018-01-31 NOTE — Patient Instructions (Addendum)
Wellness with nurse end January, call if you need me sooner   MD follow up in July 2019  Fasting chem 7 and TSH first week in September,   When getting blood for urology ( [facility])   Reconsider flu vaccine and come if you decide on this   Reconsider stool testing for colon screening  Please work on stopping smoking altogether now at 3 /day    Steps to Quit Smoking Smoking tobacco can be bad for your health. It can also affect almost every organ in your body. Smoking puts you and people around you at risk for many serious long-lasting (chronic) diseases. Quitting smoking is hard, but it is one of the best things that you can do for your health. It is never too late to quit. What are the benefits of quitting smoking? When you quit smoking, you lower your risk for getting serious diseases and conditions. They can include:  Lung cancer or lung disease.  Heart disease.  Stroke.  Heart attack.  Not being able to have children (infertility).  Weak bones (osteoporosis) and broken bones (fractures).  If you have coughing, wheezing, and shortness of breath, those symptoms may get better when you quit. You may also get sick less often. If you are pregnant, quitting smoking can help to lower your chances of having a baby of low birth weight. What can I do to help me quit smoking? Talk with your doctor about what can help you quit smoking. Some things you can do (strategies) include:  Quitting smoking totally, instead of slowly cutting back how much you smoke over a period of time.  Going to in-person counseling. You are more likely to quit if you go to many counseling sessions.  Using resources and support systems, such as: ? Database administrator with a Social worker. ? Phone quitlines. ? Careers information officer. ? Support groups or group counseling. ? Text messaging programs. ? Mobile phone apps or applications.  Taking medicines. Some of these medicines may have nicotine in them. If  you are pregnant or breastfeeding, do not take any medicines to quit smoking unless your doctor says it is okay. Talk with your doctor about counseling or other things that can help you.  Talk with your doctor about using more than one strategy at the same time, such as taking medicines while you are also going to in-person counseling. This can help make quitting easier. What things can I do to make it easier to quit? Quitting smoking might feel very hard at first, but there is a lot that you can do to make it easier. Take these steps:  Talk to your family and friends. Ask them to support and encourage you.  Call phone quitlines, reach out to support groups, or work with a Social worker.  Ask people who smoke to not smoke around you.  Avoid places that make you want (trigger) to smoke, such as: ? Bars. ? Parties. ? Smoke-break areas at work.  Spend time with people who do not smoke.  Lower the stress in your life. Stress can make you want to smoke. Try these things to help your stress: ? Getting regular exercise. ? Deep-breathing exercises. ? Yoga. ? Meditating. ? Doing a body scan. To do this, close your eyes, focus on one area of your body at a time from head to toe, and notice which parts of your body are tense. Try to relax the muscles in those areas.  Download or buy apps on your mobile phone  or tablet that can help you stick to your quit plan. There are many free apps, such as QuitGuide from the [employer] Office manager for Disease Control and Prevention). You can find more support from smokefree.gov and other websites.  This information is not intended to replace advice given to you by your health care provider. Make sure you discuss any questions you have with your health care provider. Document Released: 03/25/2009 Document Revised: 01/25/2016 Document Reviewed: 10/13/2014 Elsevier Interactive Patient Education  2018 [employer].

## 2018-02-02 ENCOUNTER — Encounter: Payer: Self-pay | Admitting: Family Medicine

## 2018-02-02 NOTE — Progress Notes (Signed)
   Craig Shaffer     MRN: [id number]      DOB: [birth year]   HPI Craig Shaffer is here for follow up and re-evaluation of chronic medical conditions, medication management and review of any available recent lab and radiology data.  Preventive health is updated, specifically  Cancer screening and Immunization.   Sees urology annually re ED, happy with the pump. The PT denies any adverse reactions to current medications since the last visit.  There are no new concerns.  There are no specific complaints   ROS Denies recent fever or chills. Denies sinus pressure, nasal congestion, ear pain or sore throat. Denies chest congestion, productive cough or wheezing. Denies chest pains, palpitations and leg swelling Denies abdominal pain, nausea, vomiting,diarrhea or constipation.   Denies dysuria, frequency, hesitancy or incontinence. Denies joint pain, swelling and limitation in mobility. Denies headaches, seizures, numbness, or tingling. Denies depression, anxiety or insomnia. Denies skin break down or rash.   PE  BP 120/72   Pulse 79   Resp 12   Ht 5\' 9"  (1.753 m)   Wt 156 lb 1.3 oz (70.8 kg)   SpO2 96% Comment: room air  BMI 23.05 kg/m   Patient alert and oriented and in no cardiopulmonary distress.  HEENT: No facial asymmetry, EOMI,   oropharynx pink and moist.  Neck supple no JVD, no mass.  Chest: Clear to auscultation bilaterally.  CVS: S1, S2 no murmurs, no S3.Regular rate.  ABD: Soft non tender.   Ext: No edema  MS: Adequate though reduced ROM spine, shoulders, hips and knees.  Skin: Intact, no ulcerations or rash noted.  Psych: Good eye contact, normal affect. Memory intact not anxious or depressed appearing.  CNS: CN 2-12 intact, power,  normal throughout.no focal deficits noted.   Assessment & Plan  Essential hypertension Controlled, no change in medication DASH diet and commitment to daily physical activity for a minimum of 30 minutes discussed and  encouraged, as a part of hypertension management. The importance of attaining a healthy weight is also discussed.  BP/Weight 01/31/2018 08/27/2017 07/18/2017 03/26/2017 08/14/2016 06/22/2016 35/10/7320  Systolic BP 025 427 062 376 283 151 761  Diastolic BP 72 82 76 80 80 78 66  Wt. (Lbs) 156.08 159 159.12 157.25 155.8 156.08 -  BMI 23.05 23.48 23.5 23.22 23.01 23.05 -       NICOTINE ADDICTION  Ask: currently smokes 3 cigarettes / day Assess: currently smokes 3 cigarettes daily, no interest in setting a quit date, states he is reducing slowly Advise : needs to quit to reduce cancer, heart disease and stroke and lung disease Assist: counselled for 5 mins, smoking cessation material offered , no interest Arrange f/u in 4 to 5 months

## 2018-02-02 NOTE — Assessment & Plan Note (Signed)
Controlled, no change in medication DASH diet and commitment to daily physical activity for a minimum of 30 minutes discussed and encouraged, as a part of hypertension management. The importance of attaining a healthy weight is also discussed.  BP/Weight 01/31/2018 08/27/2017 07/18/2017 03/26/2017 08/14/2016 06/22/2016 35/09/3012  Systolic BP 840 397 953 692 230 097 949  Diastolic BP 72 82 76 80 80 78 66  Wt. (Lbs) 156.08 159 159.12 157.25 155.8 156.08 -  BMI 23.05 23.48 23.5 23.22 23.01 23.05 -

## 2018-02-02 NOTE — Assessment & Plan Note (Addendum)
  Ask: currently smokes 3 cigarettes / day Assess: currently smokes 3 cigarettes daily, no interest in setting a quit date, states he is reducing slowly Advise : needs to quit to reduce cancer, heart disease and stroke and lung disease Assist: counselled for 5 mins, smoking cessation material offered , no interest Arrange f/u in 4 to 5 months

## 2018-02-21 DIAGNOSIS — R972 Elevated prostate specific antigen [PSA]: Secondary | ICD-10-CM | POA: Diagnosis not present

## 2018-02-22 ENCOUNTER — Ambulatory Visit: Payer: Medicare Other | Admitting: Urology

## 2018-03-27 IMAGING — CT CT ABD-PELV W/ CM
2 of 5 series · 16 of 46 positions shown, 18 images · IV contrast (APPLIED)
Comparison: CT abdomen pelvis 04/30/2006

CLINICAL DATA: Right lower quadrant pain

EXAM:
CT ABDOMEN AND PELVIS WITH CONTRAST
TECHNIQUE: Multidetector CT imaging of the abdomen and pelvis was performed
using the standard protocol following bolus administration of
intravenous contrast.
CONTRAST:  100 mL Nsovue-MHH IV, 30 mL Nsovue-MHH PO

[Series 2: axial st · axial · 0.73mm/px · z∈[-513,-103]mm · 13 of 94 slices shown, 15 images]
[im 6/94  soft-tissue]
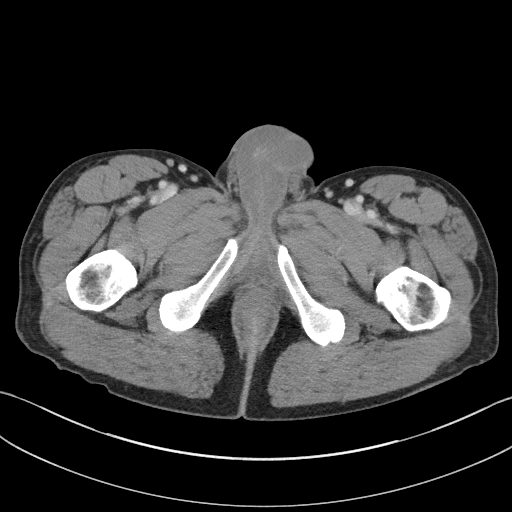
[im 6/94  bone]
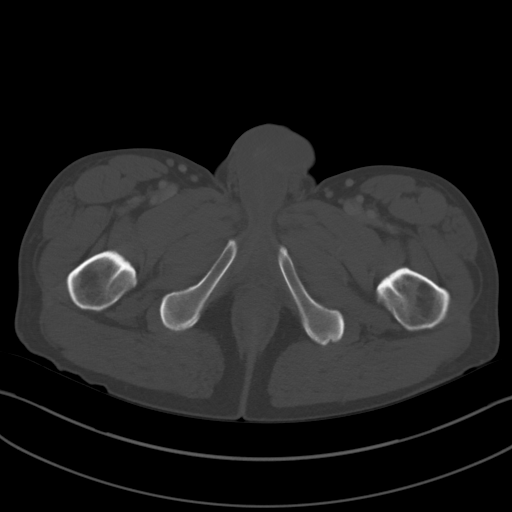
[im 11/94  soft-tissue]
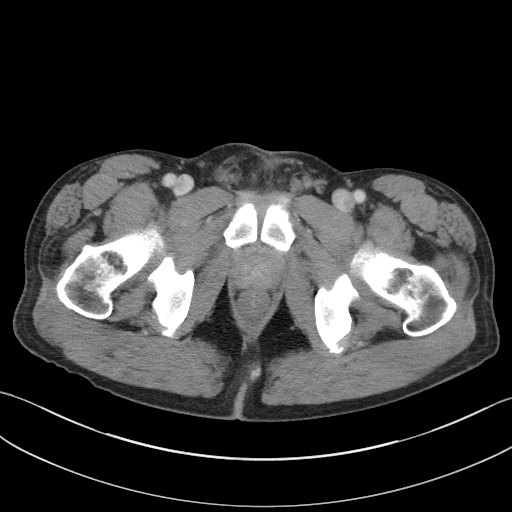
[im 22/94  soft-tissue]
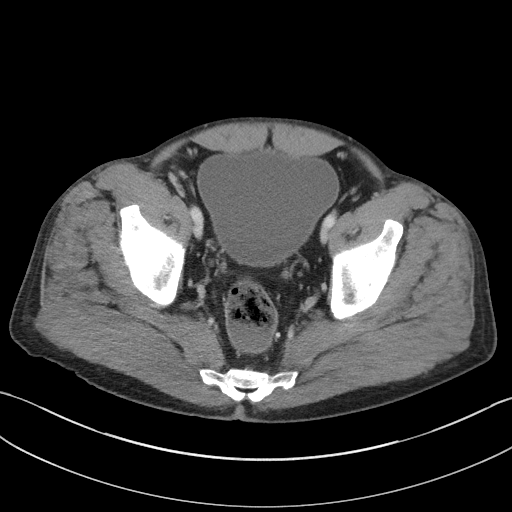
[im 28/94  soft-tissue]
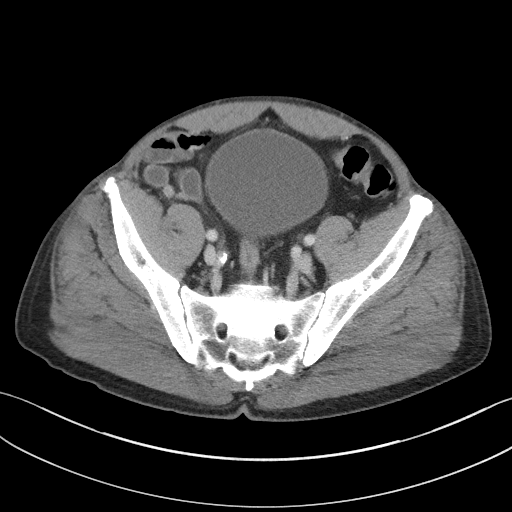
[im 33/94  soft-tissue]
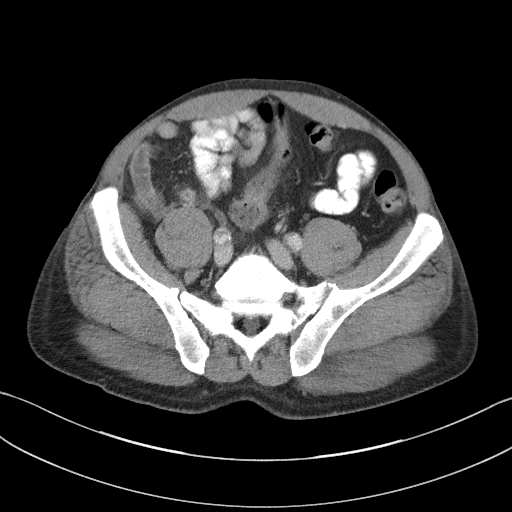
[im 39/94  soft-tissue]
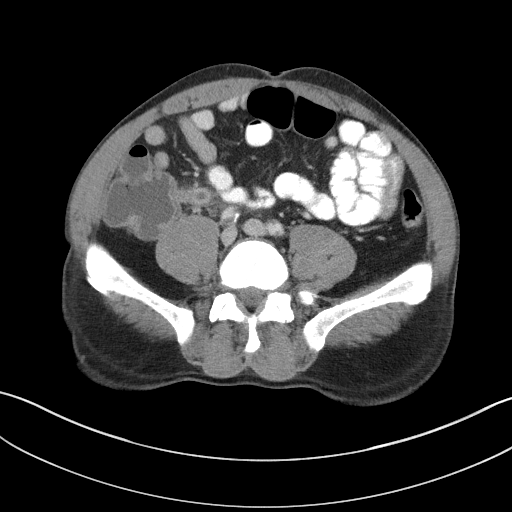
[im 50/94  soft-tissue]
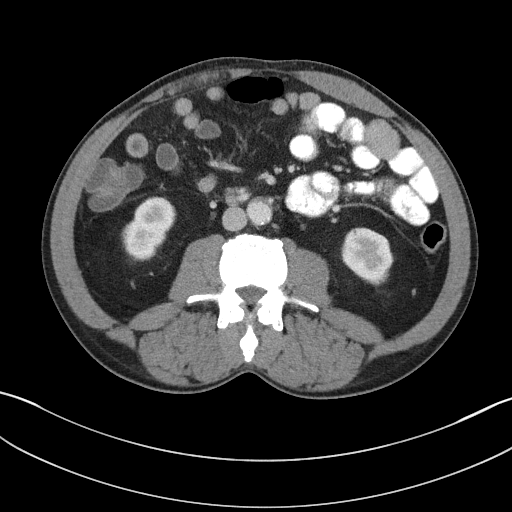
[im 55/94  soft-tissue]
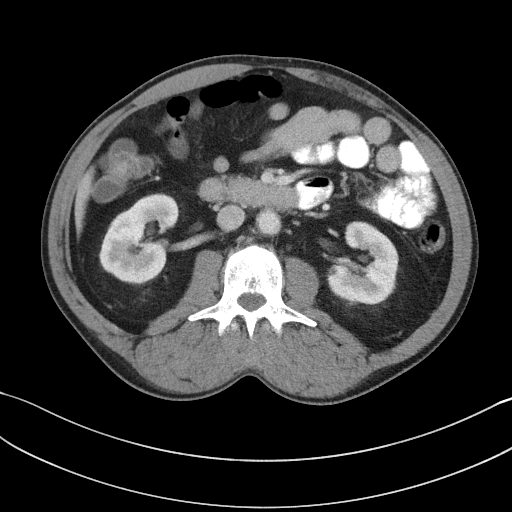
[im 61/94  soft-tissue]
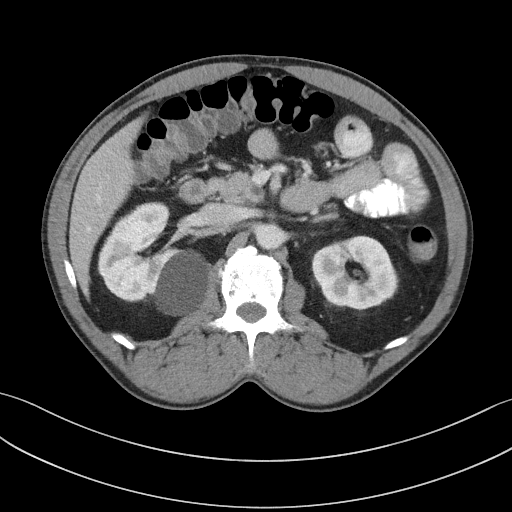
[im 61/94  bone]
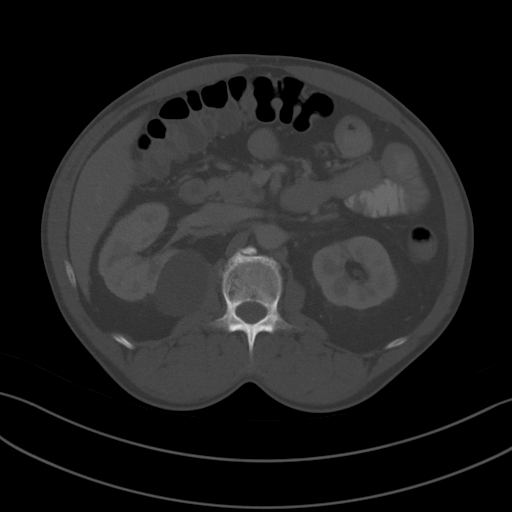
[im 66/94  soft-tissue]
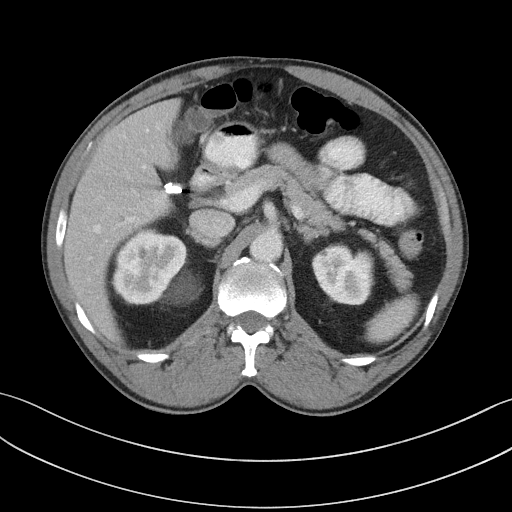
[im 72/94  soft-tissue]
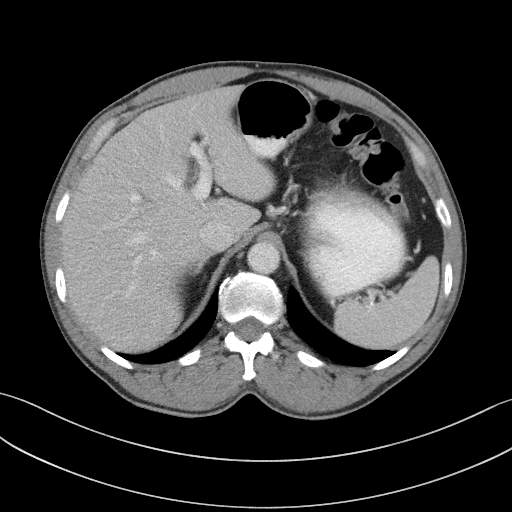
[im 83/94  soft-tissue]
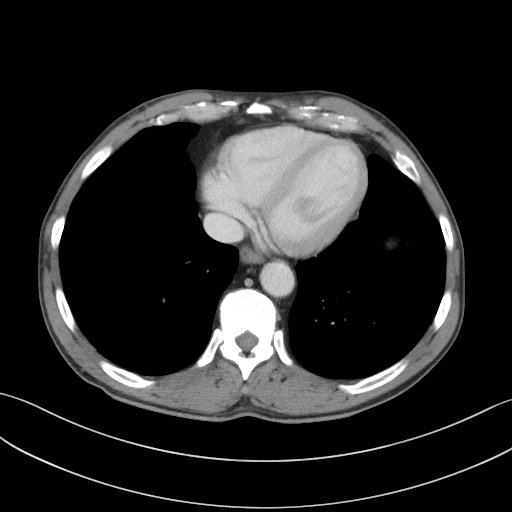
[im 88/94  soft-tissue]
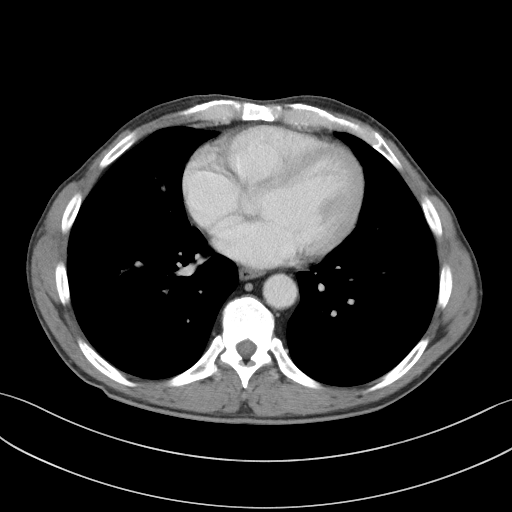

[Series 4: coronal st · coronal · 0.78mm/px · 3 of 100 slices shown]
[im 34/100  soft-tissue]
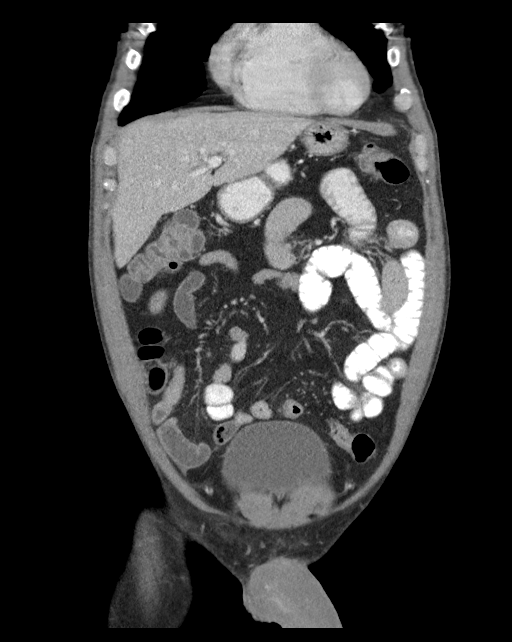
[im 45/100  soft-tissue]
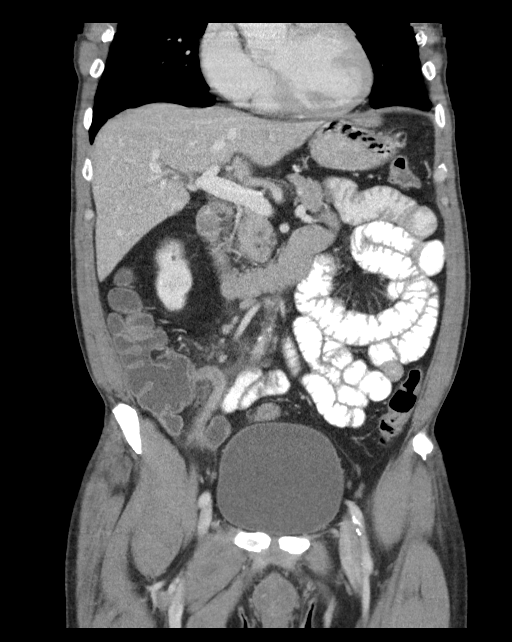
[im 56/100  soft-tissue]
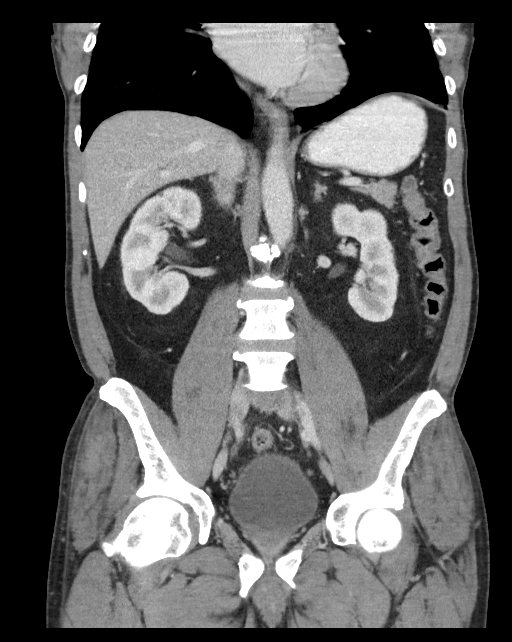

[16 of 46 positions shown; findings below may reference images not displayed]

FINDINGS: Lower chest: Posterior right basilar opacity. No pleural effusion or
pulmonary nodule.

Hepatobiliary: Normal hepatic size and contours without focal liver
lesion. No perihepatic ascites. No intra- or extrahepatic biliary
dilatation. Status post cholecystectomy.

Pancreas: Normal pancreatic contours and enhancement. No
peripancreatic fluid collection or pancreatic ductal dilatation.

Spleen: Normal.

Adrenals/Urinary Tract: Normal adrenal glands. There is a large
right renal cyst, measuring 5 cm.

Stomach/Bowel: No abnormal bowel dilatation. No bowel wall
thickening or adjacent fat stranding to indicate acute inflammation.
No abdominal fluid collection. The appendix is mildly dilated with
adjacent inflammatory stranding. No evidence of perforation or
abscess formation.

Vascular/Lymphatic: There is atherosclerotic calcification of the
non aneurysmal abdominal aorta. No abdominal or pelvic adenopathy.

Reproductive: The prostate is enlarged mildly measuring 4.8 cm.

Musculoskeletal: Lower lumbar facet arthrosis. No bony spinal canal
stenosis. No lytic or blastic lesions. Normal visualized
extrathoracic and extraperitoneal soft tissues.

Other: No contributory non-categorized findings.
IMPRESSION: 1. Mildly dilated appendix with surrounding inflammatory stranding,
consistent with early findings of acute appendicitis.
2. No abscess formation or evidence of appendiceal perforation.

## 2018-04-08 ENCOUNTER — Other Ambulatory Visit: Payer: Self-pay | Admitting: Family Medicine

## 2018-04-12 ENCOUNTER — Ambulatory Visit (INDEPENDENT_AMBULATORY_CARE_PROVIDER_SITE_OTHER): Payer: Medicare Other | Admitting: Urology

## 2018-04-12 DIAGNOSIS — N400 Enlarged prostate without lower urinary tract symptoms: Secondary | ICD-10-CM | POA: Diagnosis not present

## 2018-04-12 DIAGNOSIS — R972 Elevated prostate specific antigen [PSA]: Secondary | ICD-10-CM | POA: Diagnosis not present

## 2018-04-12 DIAGNOSIS — N5201 Erectile dysfunction due to arterial insufficiency: Secondary | ICD-10-CM | POA: Diagnosis not present

## 2018-04-27 ENCOUNTER — Other Ambulatory Visit: Payer: Self-pay | Admitting: Family Medicine

## 2018-05-06 ENCOUNTER — Other Ambulatory Visit: Payer: Self-pay | Admitting: Family Medicine

## 2018-05-06 MED ORDER — FAMOTIDINE 40 MG PO TABS: 40.0000 mg | ORAL_TABLET | Freq: Every day | ORAL | 1 refills | Status: DC

## 2018-06-17 ENCOUNTER — Ambulatory Visit (INDEPENDENT_AMBULATORY_CARE_PROVIDER_SITE_OTHER): Payer: Medicare Other

## 2018-06-17 VITALS — BP 153/88 | HR 77 | Resp 12 | Ht 69.0 in | Wt 160.0 lb

## 2018-06-17 DIAGNOSIS — Z0000 Encounter for general adult medical examination without abnormal findings: Secondary | ICD-10-CM

## 2018-06-17 NOTE — Patient Instructions (Signed)
[name] , Thank you for taking time to come for your Medicare Wellness Visit. I appreciate your ongoing commitment to your health goals. Please review the following plan we discussed and let me know if I can assist you in the future.   Screening recommendations/referrals: Colonoscopy: up to date  Recommended yearly ophthalmology/optometry visit for glaucoma screening and checkup Recommended yearly dental visit for hygiene and checkup  Vaccinations: Influenza vaccine: refused  Pneumococcal vaccine: N/A  Tdap vaccine: up to date  Shingles vaccine: N/A    Advanced directives: patient refused   Conditions/risks identified: smoking, hypertension  Next appointment: wellness visit in one year   Preventive Care 40-64 Years, Male Preventive care refers to lifestyle choices and visits with your health care provider that can promote health and wellness. What does preventive care include?  A yearly physical exam. This is also called an annual well check.  Dental exams once or twice a year.  Routine eye exams. Ask your health care provider how often you should have your eyes checked.  Personal lifestyle choices, including:  Daily care of your teeth and gums.  Regular physical activity.  Eating a healthy diet.  Avoiding tobacco and drug use.  Limiting alcohol use.  Practicing safe sex.  Taking low-dose aspirin every day starting at age 42. What happens during an annual well check? The services and screenings done by your health care provider during your annual well check will depend on your age, overall health, lifestyle risk factors, and family history of disease. Counseling  Your health care provider may ask you questions about your:  Alcohol use.  Tobacco use.  Drug use.  Emotional well-being.  Home and relationship well-being.  Sexual activity.  Eating habits.  Work and work Statistician. Screening  You may have the following tests or  measurements:  Height, weight, and BMI.  Blood pressure.  Lipid and cholesterol levels. These may be checked every 5 years, or more frequently if you are over 49 years old.  Skin check.  Lung cancer screening. You may have this screening every year starting at age 26 if you have a 30-pack-year history of smoking and currently smoke or have quit within the past 15 years.  Fecal occult blood test (FOBT) of the stool. You may have this test every year starting at age 72.  Flexible sigmoidoscopy or colonoscopy. You may have a sigmoidoscopy every 5 years or a colonoscopy every 10 years starting at age 22.  Prostate cancer screening. Recommendations will vary depending on your family history and other risks.  Hepatitis C blood test.  Hepatitis B blood test.  Sexually transmitted disease (STD) testing.  Diabetes screening. This is done by checking your blood sugar (glucose) after you have not eaten for a while (fasting). You may have this done every 1-3 years. Discuss your test results, treatment options, and if necessary, the need for more tests with your health care provider. Vaccines  Your health care provider may recommend certain vaccines, such as:  Influenza vaccine. This is recommended every year.  Tetanus, diphtheria, and acellular pertussis (Tdap, Td) vaccine. You may need a Td booster every 10 years.  Zoster vaccine. You may need this after age 85.  Pneumococcal 13-valent conjugate (PCV13) vaccine. You may need this if you have certain conditions and have not been vaccinated.  Pneumococcal polysaccharide (PPSV23) vaccine. You may need one or two doses if you smoke cigarettes or if you have certain conditions. Talk to your health care provider about which screenings  and vaccines you need and how often you need them. This information is not intended to replace advice given to you by your health care provider. Make sure you discuss any questions you have with your health care  provider. Document Released: 06/25/2015 Document Revised: 02/16/2016 Document Reviewed: 03/30/2015 Elsevier Interactive Patient Education  2017 [employer] Prevention in the Home Falls can cause injuries. They can happen to people of all ages. There are many things you can do to make your home safe and to help prevent falls. What can I do on the outside of my home?  Regularly fix the edges of walkways and driveways and fix any cracks.  Remove anything that might make you trip as you walk through a door, such as a raised step or threshold.  Trim any bushes or trees on the path to your home.  Use bright outdoor lighting.  Clear any walking paths of anything that might make someone trip, such as rocks or tools.  Regularly check to see if handrails are loose or broken. Make sure that both sides of any steps have handrails.  Any raised decks and porches should have guardrails on the edges.  Have any leaves, snow, or ice cleared regularly.  Use sand or salt on walking paths during winter.  Clean up any spills in your garage right away. This includes oil or grease spills. What can I do in the bathroom?  Use night lights.  Install grab bars by the toilet and in the tub and shower. Do not use towel bars as grab bars.  Use non-skid mats or decals in the tub or shower.  If you need to sit down in the shower, use a plastic, non-slip stool.  Keep the floor dry. Clean up any water that spills on the floor as soon as it happens.  Remove soap buildup in the tub or shower regularly.  Attach bath mats securely with double-sided non-slip rug tape.  Do not have throw rugs and other things on the floor that can make you trip. What can I do in the bedroom?  Use night lights.  Make sure that you have a light by your bed that is easy to reach.  Do not use any sheets or blankets that are too big for your bed. They should not hang down onto the floor.  Have a firm chair that has  side arms. You can use this for support while you get dressed.  Do not have throw rugs and other things on the floor that can make you trip. What can I do in the kitchen?  Clean up any spills right away.  Avoid walking on wet floors.  Keep items that you use a lot in easy-to-reach places.  If you need to reach something above you, use a strong step stool that has a grab bar.  Keep electrical cords out of the way.  Do not use floor polish or wax that makes floors slippery. If you must use wax, use non-skid floor wax.  Do not have throw rugs and other things on the floor that can make you trip. What can I do with my stairs?  Do not leave any items on the stairs.  Make sure that there are handrails on both sides of the stairs and use them. Fix handrails that are broken or loose. Make sure that handrails are as long as the stairways.  Check any carpeting to make sure that it is firmly attached to the stairs. Fix  any carpet that is loose or worn.  Avoid having throw rugs at the top or bottom of the stairs. If you do have throw rugs, attach them to the floor with carpet tape.  Make sure that you have a light switch at the top of the stairs and the bottom of the stairs. If you do not have them, ask someone to add them for you. What else can I do to help prevent falls?  Wear shoes that:  Do not have high heels.  Have rubber bottoms.  Are comfortable and fit you well.  Are closed at the toe. Do not wear sandals.  If you use a stepladder:  Make sure that it is fully opened. Do not climb a closed stepladder.  Make sure that both sides of the stepladder are locked into place.  Ask someone to hold it for you, if possible.  Clearly mark and make sure that you can see:  Any grab bars or handrails.  First and last steps.  Where the edge of each step is.  Use tools that help you move around (mobility aids) if they are needed. These  include:  Canes.  Walkers.  Scooters.  Crutches.  Turn on the lights when you go into a dark area. Replace any light bulbs as soon as they burn out.  Set up your furniture so you have a clear path. Avoid moving your furniture around.  If any of your floors are uneven, fix them.  If there are any pets around you, be aware of where they are.  Review your medicines with your doctor. Some medicines can make you feel dizzy. This can increase your chance of falling. Ask your doctor what other things that you can do to help prevent falls. This information is not intended to replace advice given to you by your health care provider. Make sure you discuss any questions you have with your health care provider. Document Released: 03/25/2009 Document Revised: 11/04/2015 Document Reviewed: 07/03/2014 Elsevier Interactive Patient Education  2017 [employer].

## 2018-06-17 NOTE — Progress Notes (Signed)
Subjective:   BURR SOFFER is a 58 y.o. male who presents for Medicare Annual/Subsequent preventive examination.  Review of Systems:   Cardiac Risk Factors include: advanced age (>48men, >15 women);smoking/ tobacco exposure;hypertension;male gender     Objective:    Vitals: BP (!) 153/88   Pulse 77   Resp 12   Ht 5\' 9"  (1.753 m)   Wt 160 lb (72.6 kg)   SpO2 100%   BMI 23.63 kg/m   Body mass index is 23.63 kg/m.  Advanced Directives 06/17/2018 07/18/2017 06/22/2016 05/11/2016 05/11/2016 05/11/2016 05/10/2016  Does Patient Have a Medical Advance Directive? No No No - Yes No No  Does patient want to make changes to medical advance directive? - - No - Patient declined - - - -  Would patient like information on creating a medical advance directive? No - Patient declined No - Patient declined - No - Patient declined - No - Patient declined -    Tobacco Social History   Tobacco Use  Smoking Status Current Every Day Smoker  . Packs/day: 0.25  . Years: 20.00  . Pack years: 5.00  . Types: Cigarettes  Smokeless Tobacco Never Used  Tobacco Comment   4 cigarettes a day, slowly weaning himself off     Ready to quit: No Counseling given: Yes Comment: 4 cigarettes a day, slowly weaning himself off   Clinical Intake:  Pre-visit preparation completed: Yes  Pain : No/denies pain Pain Score: 0-No pain     BMI - recorded: 23.63 Nutritional Status: BMI of 19-24  Normal Nutritional Risks: None Diabetes: No  How often do you need to have someone help you when you read instructions, pamphlets, or other written materials from your doctor or pharmacy?: 1 - Never What is the last grade level you completed in school?: 12 grade   Interpreter Needed?: No     Past Medical History:  Diagnosis Date  . Anemia 2012 MCV 76.2 HB 12.3 CR 1.0-1.24   FERRITIN 14   . Demyelinating disease of central nervous system ([place])   . ED (erectile dysfunction)   . Elevated PSA   . GERD  (gastroesophageal reflux disease)   . Hypertension   . Nicotine addiction    Past Surgical History:  Procedure Laterality Date  . APPENDECTOMY  04/2016  . CHOLECYSTECTOMY  5/07  . COLONOSCOPY  2002 [facility] POLYP  . COLONOSCOPY  JAN 2013 [facility] ANEMIA   H POLYP, IH  . GIVENS CAPSULE STUDY  10/05/2011   Procedure: GIVENS CAPSULE STUDY;  Surgeon: Danie Binder, MD;  Location: [facility] ENDO SUITE;  Service: Endoscopy;  Laterality: N/A;  . LAPAROSCOPIC APPENDECTOMY N/A 05/11/2016   Procedure: APPENDECTOMY LAPAROSCOPIC;  Surgeon: Aviva Signs, MD;  Location: [facility] ORS;  Service: General;  Laterality: N/A;  . UPPER GASTROINTESTINAL ENDOSCOPY  JAN 2013 ANEMIA   GASTRITIS, NL DUO Bx   Family History  Problem Relation Age of Onset  . Hypertension Mother   . Cancer Mother 30       metastatized to liver, died at age 16  . Hypertension Sister   . Hypertension Brother   . Hypertension Sister   . Hypertension Sister   . Stroke Sister   . Hypertension Sister   . Heart disease Sister 71       stents in 4 valves, ruptd aorta  . Hypertension Brother   . Colon cancer Neg Hx    Social History   Socioeconomic History  . Marital  status: Single    Spouse name: Not on file  . Number of children: 0  . Years of education: 33  . Highest education level: 12th grade  Occupational History    Employer: UNEMPLOYED  Social Needs  . Financial resource strain: Not hard at all  . Food insecurity:    Worry: Never true    Inability: Never true  . Transportation needs:    Medical: No    Non-medical: No  Tobacco Use  . Smoking status: Current Every Day Smoker    Packs/day: 0.25    Years: 20.00    Pack years: 5.00    Types: Cigarettes  . Smokeless tobacco: Never Used  . Tobacco comment: 4 cigarettes a day, slowly weaning himself off  Substance and Sexual Activity  . Alcohol use: No    Alcohol/week: 0.0 standard drinks  . Drug use: No  . Sexual activity: Yes  Lifestyle  . Physical activity:     Days per week: 3 days    Minutes per session: 60 min  . Stress: Not at all  Relationships  . Social connections:    Talks on phone: More than three times a week    Gets together: More than three times a week    Attends religious service: More than 4 times per year    Active member of club or organization: No    Attends meetings of clubs or organizations: Never    Relationship status: Never married  Other Topics Concern  . Not on file  Social History Narrative   Lives with sister and aunt. Aunt passed away two weeks ago     Outpatient Encounter Medications as of 06/17/2018  Medication Sig  . diltiazem (TIAZAC) 360 MG 24 hr capsule TAKE 1 CAPSULE BY MOUTH EVERY DAY  . famotidine (PEPCID) 40 MG tablet Take 1 tablet (40 mg total) by mouth daily.  . montelukast (SINGULAIR) 10 MG tablet TAKE 1 TABLET(10 MG) BY MOUTH AT BEDTIME   No facility-administered encounter medications on file as of 06/17/2018.     Activities of Daily Living In your present state of health, do you have any difficulty performing the following activities: 06/17/2018 07/18/2017  Hearing? N N  Vision? N N  Difficulty concentrating or making decisions? N N  Walking or climbing stairs? N N  Dressing or bathing? N N  Doing errands, shopping? N N  Preparing Food and eating ? N N  Using the Toilet? N N  In the past six months, have you accidently leaked urine? N N  Do you have problems with loss of bowel control? N N  Managing your Medications? N N  Managing your Finances? N N  Housekeeping or managing your Housekeeping? N N  Some recent data might be hidden    Patient Care Team: Fayrene Helper, MD as PCP - [facility], Marga Melnick, MD (Gastroenterology) Irine Seal, MD as Attending Physician (Urology)   Assessment:   This is a routine wellness examination for Leon.  Exercise Activities and Dietary recommendations Current Exercise Habits: Home exercise routine, Type of exercise: strength  training/weights;walking, Time (Minutes): 30, Frequency (Times/Week): 3, Weekly Exercise (Minutes/Week): 90, Intensity: Moderate, Exercise limited by: cardiac condition(s)  Goals    . DIET - DECREASE SODA OR JUICE INTAKE    . Quit smoking / using tobacco     Starting 06/22/2016 I would like to continue slowly weaning myself off of cigarettes. By the end of Februrary I would like to be down  to 2-3 cigarettes a day.       Fall Risk Fall Risk  06/17/2018 01/31/2018 07/18/2017 06/22/2016 08/10/2015  Falls in the past year? 0 No No No No  Risk for fall due to : Impaired balance/gait - - - -   Is the patient's home free of loose throw rugs in walkways, pet beds, electrical cords, etc?   yes      Grab bars in the bathroom? no      Handrails on the stairs?   yes      Adequate lighting?   yes  Timed Get Up and Go Performed: Patient able to perform in 6 seconds without assistance   Depression Screen PHQ 2/9 Scores 06/17/2018 01/31/2018 07/18/2017 06/22/2016  PHQ - 2 Score 0 0 0 0    Cognitive Function     6CIT Screen 06/17/2018 07/18/2017 06/22/2016  What Year? 0 points 0 points 0 points  What month? 0 points 0 points 0 points  What time? 0 points 0 points 0 points  Count back from 20 0 points 0 points 0 points  Months in reverse 0 points 0 points 0 points  Repeat phrase 0 points 0 points 2 points  Total Score 0 0 2    Immunization History  Administered Date(s) Administered  . Influenza,inj,Quad PF,6+ Mos 04/04/2013, 03/16/2014, 03/10/2015  . Td 02/18/2004    Qualifies for Shingles Vaccine? N/A   Screening Tests Health Maintenance  Topic Date Due  . INFLUENZA VACCINE  09/20/2018 (Originally 01/10/2018)  . TETANUS/TDAP  02/03/2019 (Originally 02/17/2014)  . COLONOSCOPY  06/22/2021  . Hepatitis C Screening  Completed  . HIV Screening  Completed   Cancer Screenings: Lung: Low Dose CT Chest recommended if Age 65-80 years, 30 pack-year currently smoking OR have quit w/in 15years. Patient does not  qualify. Colorectal: up to date   Additional Screenings:  Hepatitis C Screening:complete       Plan:   Continue to try and quit smoking, and drink less soda   I have personally reviewed and noted the following in the patient's chart:   . Medical and social history . Use of alcohol, tobacco or illicit drugs  . Current medications and supplements . Functional ability and status . Nutritional status . Physical activity . Advanced directives . List of other physicians . Hospitalizations, surgeries, and ER visits in previous 12 months . Vitals . Screenings to include cognitive, depression, and falls . Referrals and appointments  In addition, I have reviewed and discussed with patient certain preventive protocols, quality metrics, and best practice recommendations. A written personalized care plan for preventive services as well as general preventive health recommendations were provided to patient.     Hayden Pedro, LPN  07/21/9240

## 2018-07-26 ENCOUNTER — Other Ambulatory Visit: Payer: Self-pay | Admitting: Family Medicine

## 2018-10-07 ENCOUNTER — Other Ambulatory Visit: Payer: Self-pay | Admitting: Family Medicine

## 2018-10-31 ENCOUNTER — Ambulatory Visit (INDEPENDENT_AMBULATORY_CARE_PROVIDER_SITE_OTHER): Payer: Medicare Other | Admitting: Family Medicine

## 2018-10-31 ENCOUNTER — Encounter: Payer: Self-pay | Admitting: Family Medicine

## 2018-10-31 VITALS — BP 150/86 | Ht 69.0 in | Wt 165.0 lb

## 2018-10-31 DIAGNOSIS — I10 Essential (primary) hypertension: Secondary | ICD-10-CM | POA: Diagnosis not present

## 2018-10-31 DIAGNOSIS — F528 Other sexual dysfunction not due to a substance or known physiological condition: Secondary | ICD-10-CM | POA: Diagnosis not present

## 2018-10-31 DIAGNOSIS — Z13220 Encounter for screening for lipoid disorders: Secondary | ICD-10-CM | POA: Diagnosis not present

## 2018-10-31 DIAGNOSIS — F17200 Nicotine dependence, unspecified, uncomplicated: Secondary | ICD-10-CM | POA: Diagnosis not present

## 2018-10-31 DIAGNOSIS — F17210 Nicotine dependence, cigarettes, uncomplicated: Secondary | ICD-10-CM

## 2018-10-31 DIAGNOSIS — Z7189 Other specified counseling: Secondary | ICD-10-CM | POA: Diagnosis not present

## 2018-10-31 NOTE — Patient Instructions (Addendum)
F/U in 8  weeks in office needs blood pressure to be re evaluated  Need to cut back on cigarettes , now smoking 5/ day, and you intend to  Quit before the ned of Summer.  Condolence and pratyers re recent losses   Pease get fasting CBC, lipid, cmp and eGFR, tSH, and PSA 1  Within the next 1 to 2 weeks ([facility])  Social distancing. Frequent hand washing with soap and water Keeping your hands off of your face. These 3 practices will help to keep both you and your community healthy during this time. Please practice them faithfully!   Thanks for choosing [facility], we consider it a privelige to serve you.

## 2018-11-03 ENCOUNTER — Encounter: Payer: Self-pay | Admitting: Family Medicine

## 2018-11-03 DIAGNOSIS — Z7189 Other specified counseling: Secondary | ICD-10-CM | POA: Insufficient documentation

## 2018-11-03 NOTE — Assessment & Plan Note (Signed)
Chronic and unchanged,uses pump successfully

## 2018-11-03 NOTE — Assessment & Plan Note (Signed)
Chronic left leg weakness. 

## 2018-11-03 NOTE — Assessment & Plan Note (Signed)
Uncontrolled;led when last tested Needs office follow up [place] and commitment to daily physical activity for a minimum of 30 minutes discussed and encouraged, as a part of hypertension management. The importance of attaining a healthy weight is also discussed.  BP/Weight 10/31/2018 06/17/2018 01/31/2018 08/27/2017 07/18/2017 25/75/0518 08/13/5823  Systolic BP 189 842 103 128 118 867 737  Diastolic BP 86 88 72 82 76 80 80  Wt. (Lbs) 165 160 156.08 159 159.12 157.25 155.8  BMI 24.37 23.63 23.05 23.48 23.5 23.22 23.01

## 2018-11-03 NOTE — Progress Notes (Signed)
Virtual Visit via Telephone Note  I connected with Craig Shaffer on 11/03/18 at  2:40 PM EDT by telephone and verified that I am speaking with the correct person using two identifiers. This visit type is conducted due to national recommendations for restrictions regarding the COVID -19 Pandemic. Due to the patient's age and / or co morbidities, this format is felt to be most appropriate at this time without adequate follow up. The patient has no access to video technology/ had technical difficulties with video, requiring transitioning to audio format  only ( telephone ). All issues noted this document were discussed and addressed,no physical exam can be performed in this format.   Location: Patient: home Provider: office   I discussed the limitations, risks, security and privacy concerns of performing an evaluation and management service by telephone and the availability of in person appointments. I also discussed with the patient that there may be a patient responsible charge related to this service. The patient expressed understanding and agreed to proceed.   History of Present Illness: F/u chronic problems I have been under increased stress in the past several months, I lost a brother and a nephew, and this  caused me to increase smoking again, but I do want to stop Denies recent fever or chills. Denies sinus pressure, nasal congestion, ear pain or sore throat. Denies chest congestion, productive cough or wheezing. Denies chest pains, palpitations and leg swelling Denies abdominal pain, nausea, vomiting,diarrhea or constipation.   Denies dysuria, frequency, hesitancy or incontinence. Chronic and unchanged  joint pain, and limitation in mobility. Denies headaches, seizures, numbness, or tingling. Mil depression and anxiety grieving recent losses Denies skin break down or rash.       Observations/Objective: BP (!) 150/86   Ht 5\' 9"  (1.753 m)   Wt 165 lb (74.8 kg)   BMI  24.37 kg/m     Assessment and Plan: Essential hypertension Uncontrolled;led when last tested Needs office follow up DASH diet and commitment to daily physical activity for a minimum of 30 minutes discussed and encouraged, as a part of hypertension management. The importance of attaining a healthy weight is also discussed.  BP/Weight 10/31/2018 06/17/2018 01/31/2018 08/27/2017 07/18/2017 59/56/3875 11/14/3327  Systolic BP 518 841 660 630 160 109 323  Diastolic BP 86 88 72 82 76 80 80  Wt. (Lbs) 165 160 156.08 159 159.12 157.25 155.8  BMI 24.37 23.63 23.05 23.48 23.5 23.22 23.01       NICOTINE ADDICTION Asked:confirms currently smokes cigarettes, 5 to 6 / day Assess: Unwilling to quit but cutting back Advise: needs to QUIT to reduce risk of cancer, cardio and cerebrovascular disease Assist: counseled for 11 minutes and literature provided Arrange: follow up in 3 months   ERECTILE DYSFUNCTION Chronic and unchanged,uses pump successfully  OTHER DEMYELINATING DISEASES OF CNTRL NERV SYS Chronic left leg weakness  Educated About Covid-19 Virus Infection Covid-19 Education  The signs and symptoms of of COVID -19 were discussed with the patient and how to seek care for testing. ( follow up with PCP or arrange  E-visit) The importance of social  distancing is discussed today.     Follow Up Instructions:    I discussed the assessment and treatment plan with the patient. The patient was provided an opportunity to ask questions and all were answered. The patient agreed with the plan and demonstrated an understanding of the instructions.   The patient was advised to call back or seek an in-person evaluation if the symptoms  worsen or if the condition fails to improve as anticipated.  I provided 22 minutes of non-face-to-face time during this encounter.   Tula Nakayama, MD

## 2018-11-03 NOTE — Assessment & Plan Note (Signed)
Asked:confirms currently smokes cigarettes, 5 to 6 / day Assess: Unwilling to quit but cutting back Advise: needs to QUIT to reduce risk of cancer, cardio and cerebrovascular disease Assist: counseled for 11 minutes and literature provided Arrange: follow up in 3 months

## 2018-11-03 NOTE — Assessment & Plan Note (Signed)
Covid-19 Education  The signs and symptoms of of COVID -19 were discussed with the patient and how to seek care for testing. ( follow up with PCP or arrange  E-visit) The importance of social  distancing is discussed today.  

## 2018-11-19 DIAGNOSIS — E611 Iron deficiency: Secondary | ICD-10-CM | POA: Diagnosis not present

## 2018-11-19 DIAGNOSIS — R972 Elevated prostate specific antigen [PSA]: Secondary | ICD-10-CM | POA: Diagnosis not present

## 2018-11-19 DIAGNOSIS — F528 Other sexual dysfunction not due to a substance or known physiological condition: Secondary | ICD-10-CM | POA: Diagnosis not present

## 2018-11-19 DIAGNOSIS — R7989 Other specified abnormal findings of blood chemistry: Secondary | ICD-10-CM | POA: Diagnosis not present

## 2018-11-19 DIAGNOSIS — D649 Anemia, unspecified: Secondary | ICD-10-CM | POA: Diagnosis not present

## 2018-11-19 DIAGNOSIS — I10 Essential (primary) hypertension: Secondary | ICD-10-CM | POA: Diagnosis not present

## 2018-11-19 DIAGNOSIS — Z13220 Encounter for screening for lipoid disorders: Secondary | ICD-10-CM | POA: Diagnosis not present

## 2018-11-20 ENCOUNTER — Encounter: Payer: Self-pay | Admitting: Family Medicine

## 2018-11-22 ENCOUNTER — Encounter: Payer: Self-pay | Admitting: Family Medicine

## 2018-11-22 LAB — COMPLETE METABOLIC PANEL WITH GFR
AG Ratio: 1.1 (calc) (ref 1.0–2.5)
ALT: 14 U/L (ref 9–46)
AST: 16 U/L (ref 10–35)
Albumin: 3.6 g/dL (ref 3.6–5.1)
Alkaline phosphatase (APISO): 72 U/L (ref 35–144)
BUN: 9 mg/dL (ref 7–25)
CO2: 24 mmol/L (ref 20–32)
Calcium: 8.5 mg/dL — ABNORMAL LOW (ref 8.6–10.3)
Chloride: 109 mmol/L (ref 98–110)
Creat: 0.98 mg/dL (ref 0.70–1.25)
GFR, Est African American: 97 mL/min/{1.73_m2} (ref >=60)
GFR, Est Non African American: 83 mL/min/{1.73_m2} (ref >=60)
Globulin: 3.2 g/dL (calc) (ref 1.9–3.7)
Glucose, Bld: 92 mg/dL (ref 65–99)
Potassium: 3.9 mmol/L (ref 3.5–5.3)
Sodium: 140 mmol/L (ref 135–146)
Total Bilirubin: 0.3 mg/dL (ref 0.2–1.2)
Total Protein: 6.8 g/dL (ref 6.1–8.1)

## 2018-11-22 LAB — CBC
HCT: 37.4 % — ABNORMAL LOW (ref 38.5–50.0)
Hemoglobin: 12.8 g/dL — ABNORMAL LOW (ref 13.2–17.1)
MCH: 29.9 pg (ref 27.0–33.0)
MCHC: 34.2 g/dL (ref 32.0–36.0)
MCV: 87.4 fL (ref 80.0–100.0)
MPV: 11.9 fL (ref 7.5–12.5)
Platelets: 224 10*3/uL (ref 140–400)
RBC: 4.28 10*6/uL (ref 4.20–5.80)
RDW: 14.4 % (ref 11.0–15.0)
WBC: 4.6 10*3/uL (ref 3.8–10.8)

## 2018-11-22 LAB — LIPID PANEL
Cholesterol: 161 mg/dL (ref <200)
HDL: 45 mg/dL (ref >=40)
LDL Cholesterol (Calc): 100 mg/dL (calc) — ABNORMAL HIGH
Non-HDL Cholesterol (Calc): 116 mg/dL (calc) (ref <130)
Total CHOL/HDL Ratio: 3.6 (calc) (ref <5.0)
Triglycerides: 69 mg/dL (ref <150)

## 2018-11-22 LAB — IRON: Iron: 58 ug/dL (ref 50–180)

## 2018-11-22 LAB — FERRITIN: Ferritin: 147 ng/mL (ref 24–380)

## 2018-11-22 LAB — PSA: PSA: 2.5 ng/mL (ref <=4.0)

## 2018-11-22 LAB — TSH: TSH: 1.51 mIU/L (ref 0.40–4.50)

## 2018-12-17 ENCOUNTER — Other Ambulatory Visit: Payer: Self-pay

## 2018-12-17 ENCOUNTER — Ambulatory Visit (INDEPENDENT_AMBULATORY_CARE_PROVIDER_SITE_OTHER): Payer: Medicare Other | Admitting: Family Medicine

## 2018-12-17 ENCOUNTER — Encounter: Payer: Self-pay | Admitting: Family Medicine

## 2018-12-17 VITALS — BP 140/90 | HR 70 | Temp 97.7°F | Ht 69.0 in | Wt 164.1 lb

## 2018-12-17 DIAGNOSIS — F17200 Nicotine dependence, unspecified, uncomplicated: Secondary | ICD-10-CM | POA: Diagnosis not present

## 2018-12-17 DIAGNOSIS — I10 Essential (primary) hypertension: Secondary | ICD-10-CM

## 2018-12-17 NOTE — Patient Instructions (Signed)
Thank you for coming into the office today. I appreciate the opportunity to provide you with the care for your health and wellness. Today we discussed: 3 months for follow up on smoking cessation  No labs today.  Continue to work on weaning yourself down from smoking. I have attached some information that might help with coping with quitting. GOOD LUCK!!   Please continue to practice social distancing to keep you, your family, and our community safe.  If you must go out, please wear a Mask and practice good handwashing.  [place] YOUR HANDS WELL AND FREQUENTLY. AVOID TOUCHING YOUR FACE, UNLESS YOUR HANDS ARE FRESHLY WASHED.   GET FRESH AIR DAILY. STAY HYDRATED WITH WATER.   It was a pleasure to see you and I look forward to continuing to work together on your health and well-being. Please do not hesitate to call the office if you need care or have questions about your care.  Have a wonderful day and week.  With Gratitude,  Cherly Beach, DNP, AGNP-BC   Coping with Quitting Smoking  Quitting smoking is a physical and mental challenge. You will face cravings, withdrawal symptoms, and temptation. Before quitting, work with your health care provider to make a plan that can help you cope. Preparation can help you quit and keep you from giving in. How can I cope with cravings? Cravings usually last for 5-10 minutes. If you get through it, the craving will pass. Consider taking the following actions to help you cope with cravings:  Keep your mouth busy: ? Chew sugar-free gum. ? Suck on hard candies or a straw. ? Brush your teeth.  Keep your hands and body busy: ? Immediately change to a different activity when you feel a craving. ? Squeeze or play with a ball. ? Do an activity or a hobby, like making bead jewelry, practicing needlepoint, or working with wood. ? Mix up your normal routine. ? Take a short exercise break. Go for a quick walk or run up and down stairs. ? Spend time  in public places where smoking is not allowed.  Focus on doing something kind or helpful for someone else.  Call a friend or family member to talk during a craving.  Join a support group.  Call a quit line, such as 1-800-QUIT-NOW.  Talk with your health care provider about medicines that might help you cope with cravings and make quitting easier for you. How can I deal with withdrawal symptoms? Your body may experience negative effects as it tries to get used to not having nicotine in the system. These effects are called withdrawal symptoms. They may include:  Feeling hungrier than normal.  Trouble concentrating.  Irritability.  Trouble sleeping.  Feeling depressed.  Restlessness and agitation.  Craving a cigarette. To manage withdrawal symptoms:  Avoid places, people, and activities that trigger your cravings.  Remember why you want to quit.  Get plenty of sleep.  Avoid coffee and other caffeinated drinks. These may worsen some of your symptoms. How can I handle social situations? Social situations can be difficult when you are quitting smoking, especially in the first few weeks. To manage this, you can:  Avoid parties, bars, and other social situations where people might be smoking.  Avoid alcohol.  Leave right away if you have the urge to smoke.  Explain to your family and friends that you are quitting smoking. Ask for understanding and support.  Plan activities with friends or family where smoking is not an  option. What are some ways I can cope with stress? Wanting to smoke may cause stress, and stress can make you want to smoke. Find ways to manage your stress. Relaxation techniques can help. For example:  Breathe slowly and deeply, in through your nose and out through your mouth.  Listen to soothing, relaxing music.  Talk with a family member or friend about your stress.  Light a candle.  Soak in a bath or take a shower.  Think about a peaceful  place. What are some ways I can prevent weight gain? Be aware that many people gain weight after they quit smoking. However, not everyone does. To keep from gaining weight, have a plan in place before you quit and stick to the plan after you quit. Your plan should include:  Having healthy snacks. When you have a craving, it may help to: ? Eat plain popcorn, crunchy carrots, celery, or other cut vegetables. ? Chew sugar-free gum.  Changing how you eat: ? Eat small portion sizes at meals. ? Eat 4-6 small meals throughout the day instead of 1-2 large meals a day. ? Be mindful when you eat. Do not watch television or do other things that might distract you as you eat.  Exercising regularly: ? Make time to exercise each day. If you do not have time for a long workout, do short bouts of exercise for 5-10 minutes several times a day. ? Do some form of strengthening exercise, like weight lifting, and some form of aerobic exercise, like running or swimming.  Drinking plenty of water or other low-calorie or no-calorie drinks. Drink 6-8 glasses of water daily, or as much as instructed by your health care provider. Summary  Quitting smoking is a physical and mental challenge. You will face cravings, withdrawal symptoms, and temptation to smoke again. Preparation can help you as you go through these challenges.  You can cope with cravings by keeping your mouth busy (such as by chewing gum), keeping your body and hands busy, and making calls to family, friends, or a helpline for people who want to quit smoking.  You can cope with withdrawal symptoms by avoiding places where people smoke, avoiding drinks with caffeine, and getting plenty of rest.  Ask your health care provider about the different ways to prevent weight gain, avoid stress, and handle social situations. This information is not intended to replace advice given to you by your health care provider. Make sure you discuss any questions you have  with your health care provider. Document Released: 05/26/2016 Document Revised: 05/11/2017 Document Reviewed: 05/26/2016 Elsevier Patient Education  2020 [employer].

## 2018-12-17 NOTE — Progress Notes (Signed)
Subjective:     Patient ID: Craig Shaffer, male   DOB: 1961-05-20, 58 y.o.   MRN: [id number]  Craig Shaffer presents for Hypertension (f/u)  Here for follow-up of hypertension.  male is exercising walks daily (60 minutes) and lifts some barbells,   male is adherent to a low-salt diet.  Blood pressure does not check at home well controlled at home.   Cardiac symptoms: none. Patient denies: chest pain, chest pressure/discomfort, claudication, dyspnea, exertional chest pressure/discomfort, fatigue, irregular heart beat, lower extremity edema, near-syncope, orthopnea, palpitations, paroxysmal nocturnal dyspnea, syncope and tachypnea. Cardiovascular risk factors: advanced age (older than 46 for men, 39 for women), hypertension, male gender and smoking/ tobacco exposure. Use of agents associated with hypertension: none. History of target organ damage: none.  Denies side effects of current medications.   Today patient denies signs and symptoms of COVID 19 infection including fever, chills, cough, shortness of breath, and headache.  Past Medical, Surgical, Social History, Allergies, and Medications have been Reviewed.  Past Medical History:  Diagnosis Date  . Anemia 2012 MCV 76.2 HB 12.3 CR 1.0-1.24   FERRITIN 14   . Demyelinating disease of central nervous system ([place])   . ED (erectile dysfunction)   . Elevated PSA   . GERD (gastroesophageal reflux disease)   . Hypertension   . Nicotine addiction    Past Surgical History:  Procedure Laterality Date  . APPENDECTOMY  04/2016  . CHOLECYSTECTOMY  5/07  . COLONOSCOPY  2002 DR [facility] POLYP  . COLONOSCOPY  JAN 2013 [facility] ANEMIA   H POLYP, IH  . GIVENS CAPSULE STUDY  10/05/2011   Procedure: GIVENS CAPSULE STUDY;  Surgeon: Danie Binder, MD;  Location: [facility] ENDO SU[facility];  Service: Endoscopy;  Laterality: N/A;  . LAPAROSCOPIC APPENDECTOMY N/A 05/11/2016   Procedure: APPENDECTOMY LAPAROSCOPIC;  Surgeon: Aviva Signs, MD;  Location: [facility]  ORS;  Service: General;  Laterality: N/A;  . UPPER GASTROINTESTINAL ENDOSCOPY  JAN 2013 ANEMIA   GASTRITIS, NL DUO Bx   Social History   Socioeconomic History  . Marital status: Single    Spouse name: Not on file  . Number of children: 0  . Years of education: 41  . Highest education level: 12th grade  Occupational History    Employer: UNEMPLOYED  Social Needs  . Financial resource strain: Not hard at all  . Food insecurity    Worry: Never true    Inability: Never true  . Transportation needs    Medical: No    Non-medical: No  Tobacco Use  . Smoking status: Current Every Day Smoker    Packs/day: 0.25    Years: 20.00    Pack years: 5.00    Types: Cigarettes  . Smokeless tobacco: Never Used  . Tobacco comment: 4 cigarettes a day, slowly weaning himself off  Substance and Sexual Activity  . Alcohol use: No    Alcohol/week: 0.0 standard drinks  . Drug use: No  . Sexual activity: Yes  Lifestyle  . Physical activity    Days per week: 3 days    Minutes per session: 60 min  . Stress: Not at all  Relationships  . Social connections    Talks on phone: More than three times a week    Gets together: More than three times a week    Attends religious service: More than 4 times per year    Active member of club or organization: No    Attends meetings  of clubs or organizations: Never    Relationship status: Never married  . Intimate partner violence    Fear of current or ex partner: No    Emotionally abused: No    Physically abused: No    Forced sexual activity: No  Other Topics Concern  . Not on file  Social History Narrative   Lives with sister and aunt. Aunt passed away two weeks ago     Outpatient Encounter Medications as of 12/17/2018  Medication Sig  . diltiazem (TIAZAC) 360 MG 24 hr capsule TAKE 1 CAPSULE BY MOUTH EVERY DAY   No facility-administered encounter medications on file as of 12/17/2018.    No Known Allergies  Review of Systems  Constitutional:  Negative for activity change, appetite change, chills and fever.  HENT: Negative.   Eyes: Negative.  Negative for visual disturbance.  Respiratory: Negative.   Cardiovascular: Negative.   Gastrointestinal: Negative.   Endocrine: Negative.   Genitourinary: Negative.   Musculoskeletal: Negative.   Skin: Negative.   Allergic/Immunologic: Negative.   Neurological: Negative.  Negative for dizziness and headaches.  Hematological: Negative.   Psychiatric/Behavioral: Negative.   All other systems reviewed and are negative.      Objective:     BP 140/90 (BP Location: Left Arm, Patient Position: Sitting, Cuff Size: Normal)   Pulse 70   Temp 97.7 F (36.5 C)   Ht 5\' 9"  (1.753 m)   Wt 164 lb 1.3 oz (74.4 kg)   SpO2 99%   BMI 24.23 kg/m   Physical Exam Vitals signs and nursing note reviewed.  Constitutional:      Appearance: Normal appearance. He is normal weight.  HENT:     Head: Normocephalic and atraumatic.     Right Ear: External ear normal.     Left Ear: External ear normal.     Nose: Nose normal.  Eyes:     General:        Right eye: No discharge.        Left eye: No discharge.     Conjunctiva/sclera: Conjunctivae normal.  Neck:     Musculoskeletal: Normal range of motion and neck supple.  Cardiovascular:     Rate and Rhythm: Normal rate and regular rhythm.     Pulses: Normal pulses.     Heart sounds: Normal heart sounds.  Pulmonary:     Effort: Pulmonary effort is normal.     Breath sounds: Normal breath sounds.  Musculoskeletal: Normal range of motion.  Skin:    General: Skin is warm.     Capillary Refill: Capillary refill takes less than 2 seconds.  Neurological:     Mental Status: He is alert and oriented to person, place, and time.  Psychiatric:        Mood and Affect: Mood normal.        Behavior: Behavior normal.        Thought Content: Thought content normal.        Judgment: Judgment normal.        Assessment and Plan             1. NICOTINE  ADDICTION Asked about quitting: confirms they are currently smokes cigarettes Advise to quit smoking: Educated about QUITTING to reduce the risk of cancer, cardio and cerebrovascular disease. Assess willingness: Unwilling to quit at this time, but is working on cutting back. Assist with counseling and pharmacotherapy: Counseled for 5 minutes and literature provided. Arrange for follow up: not quitting follow up in 3  months and continue to offer help.   2. Essential hypertension Controlled, continue current medication regimen.    Follow Up: 3 months smoking follow up   Perlie Mayo, DNP, AGNP-BC [place], [place], [place] [address] Office Hours: Mon-Thurs 8 am-5 pm; Fri 8 am-12 pm Office Phone:  [phone]  Office Fax: [phone]

## 2018-12-22 ENCOUNTER — Other Ambulatory Visit: Payer: Self-pay | Admitting: Family Medicine

## 2018-12-25 ENCOUNTER — Ambulatory Visit: Payer: Medicare Other | Admitting: Family Medicine

## 2019-03-13 ENCOUNTER — Encounter: Payer: Self-pay | Admitting: Family Medicine

## 2019-03-13 ENCOUNTER — Other Ambulatory Visit: Payer: Self-pay

## 2019-03-13 ENCOUNTER — Ambulatory Visit (INDEPENDENT_AMBULATORY_CARE_PROVIDER_SITE_OTHER): Payer: Medicare Other | Admitting: Family Medicine

## 2019-03-13 VITALS — Ht 69.0 in | Wt 155.0 lb

## 2019-03-13 DIAGNOSIS — F17200 Nicotine dependence, unspecified, uncomplicated: Secondary | ICD-10-CM

## 2019-03-13 DIAGNOSIS — I10 Essential (primary) hypertension: Secondary | ICD-10-CM

## 2019-03-13 NOTE — Progress Notes (Signed)
Virtual Visit via Video Note   This visit type was conducted due to national recommendations for restrictions regarding the COVID-19 Pandemic (e.g. social distancing) in an effort to limit this patient's exposure and mitigate transmission in our community.  Due to his co-morbid illnesses, this patient is at least at moderate risk for complications without adequate follow up.  This format is felt to be most appropriate for this patient at this time.  All issues noted in this document were discussed and addressed.  A limited physical exam was performed with this format.    Evaluation Performed:  Follow-up visit  Date:  03/13/2019   ID:  Craig Shaffer, DOB 1961/01/29, MRN [id number]  Patient Location: Home Provider Location: Office  Location of Patient: Home Location of Provider: Telehealth Consent was obtain for visit to be over via telehealth. I verified that I am speaking with the correct person using two identifiers.  PCP:  Fayrene Helper, MD   Chief Complaint:  F/u on smoking cessation   History of Present Illness:    Craig Shaffer is a 58 y.o. male with hx of anemia, demyelinating disease of central nervous system, nicotine addiction, hypertension among others.  Was seen by myself back in July addressing nicotine addiction.  Reported that he was smoking 6 cigarettes a day at that time.  He is now smoking 3 cigarettes a day.  Last year he was smoking 10 cigarettes a day.  He reports he is going to be going into 2021 smoke-free.  He reports he is just slowly been cutting on back.  Reports that he is feeling much better not having any issues in cutting back.  He is very excited about his current level and is looking forward to coming back in January to sure that he is smoke-free.  The patient does not have symptoms concerning for COVID-19 infection (fever, chills, cough, or new shortness of breath).   Past Medical, Surgical, Social History, Allergies, and Medications  have been Reviewed.  Past Medical History:  Diagnosis Date   Anemia 2012 MCV 76.2 HB 12.3 CR 1.0-1.24   FERRITIN 14    Demyelinating disease of central nervous system [facility])    ED (erectile dysfunction)    Elevated PSA    GERD (gastroesophageal reflux disease)    Hypertension    Nicotine addiction    Past Surgical History:  Procedure Laterality Date   APPENDECTOMY  04/2016   CHOLECYSTECTOMY  5/07   COLONOSCOPY  2002 DR [facility]   [place] POLYP   COLONOSCOPY  JAN 2013 [facility] ANEMIA   H POLYP, IH   GIVENS CAPSULE STUDY  10/05/2011   Procedure: GIVENS CAPSULE STUDY;  Surgeon: Danie Binder, MD;  Location[facility] END[facility];  Service: Endoscopy;  Laterality: N/A;   LAPAROSCOPIC APPENDECTOMY N/A 05/11/2016   Procedure: APPENDECTOMY LAPAROSCOPIC;  Surgeon: Aviva Signs, MD;  Location: [facility] ORS;  Service: General;  Laterality: N/A;   UPPER GASTROINTESTINAL ENDOSCOPY  JAN 2013 ANEMIA   GASTRITIS, NL DUO Bx     Current Meds  Medication Sig   diltiazem (TIAZAC) 360 MG 24 hr capsule TAKE 1 CAPSULE BY MOUTH EVERY DAY     Allergies:   Patient has no known allergies.   Social History   Tobacco Use   Smoking status: Current Every Day Smoker    Packs/day: 0.25    Years: 20.00    Pack years: 5.00    Types: Cigarettes   Smokeless tobacco: Never Used  Tobacco comment: 4 cigarettes a day, slowly weaning himself off  Substance Use Topics   Alcohol use: No    Alcohol/week: 0.0 standard drinks   Drug use: No     Family Hx: The patient's family history includes Cancer (age of onset: 45) in his mother; Heart disease (age of onset: 54) in his sister; Hypertension in his brother, brother, mother, sister, sister, sister, and sister; Stroke in his sister. There is no history of Colon cancer.  ROS:   Please see the history of present illness.    All other systems reviewed and are negative.   Labs/Other Tests and Data Reviewed:     Recent Labs: 11/19/2018: ALT 14; BUN 9; Creat 0.98;  Hemoglobin 12.8; Platelets 224; Potassium 3.9; Sodium 140; TSH 1.51   Recent Lipid Panel Lab Results  Component Value Date/Time   CHOL 161 11/19/2018 08:01 AM   TRIG 69 11/19/2018 08:01 AM   HDL 45 11/19/2018 08:01 AM   CHOLHDL 3.6 11/19/2018 08:01 AM   LDLCALC 100 (H) 11/19/2018 08:01 AM    Wt Readings from Last 3 Encounters:  03/13/19 155 lb (70.3 kg)  12/17/18 164 lb 1.3 oz (74.4 kg)  10/31/18 165 lb (74.8 kg)     Objective:    Vital Signs:  Ht 5\' 9"  (1.753 m)    Wt 155 lb (70.3 kg)    BMI 22.89 kg/m    GEN:  alert and oriented RESPIRATORY:  no shortness of breath in conversation PSYCH:  normal mood and affect  ASSESSMENT & PLAN:    1. NICOTINE ADDICTION Asked about quitting: confirms he is currently smokes cigarettes Advise to quit smoking: Educated about QUITTING to reduce the risk of cancer, cardio and cerebrovascular disease. Assess willingness: working on cutting back. Assist with counseling and pharmacotherapy: Counseled for 5 minutes and literature provided. Arrange for follow up: quitting follow up in 3 months and continue to offer help.   Plans to be smoke free by 2021  2. Essential hypertension Does not check BP at home. Encouraged to walk, eat well balanced meal, and take medication as directed.    Time:   Today, I have spent 10 minutes with the patient with telehealth technology discussing the above problems.     Medication Adjustments/Labs and Tests Ordered: Current medicines are reviewed at length with the patient today.  Concerns regarding medicines are outlined above.   Tests Ordered: No orders of the defined types were placed in this encounter.   Medication Changes: No orders of the defined types were placed in this encounter.   Disposition:  Follow up Jan 2021  Signed, Perlie Mayo, NP  03/13/2019 10:30 AM     [place]

## 2019-03-13 NOTE — Patient Instructions (Signed)
    I appreciate the opportunity to provide you with the care for your health and wellness. Today we discussed: smoking cessation  Follow up:  06/20/2019  No labs or referrals today  CONGRATULATIONS ON YOUR CURRENT SUCCESS WITH QUITTING! WE ARE SO HAPPY FOR YOU AND PROUD.  Please continue to practice social distancing to keep you, your family, and our community safe.  If you must go out, please wear a Mask and practice good handwashing.  GET FRESH AIR DAILY. STAY HYDRATED WITH WATER.   It was a pleasure to see you and I look forward to continuing to work together on your health and well-being. Please do not hesitate to call the office if you need care or have questions about your care.  Have a wonderful day and week. With Gratitude, Cherly Beach, DNP, AGNP-BC

## 2019-03-22 ENCOUNTER — Other Ambulatory Visit: Payer: Self-pay | Admitting: Family Medicine

## 2019-04-25 ENCOUNTER — Other Ambulatory Visit: Payer: Self-pay

## 2019-04-25 ENCOUNTER — Ambulatory Visit (INDEPENDENT_AMBULATORY_CARE_PROVIDER_SITE_OTHER): Payer: Medicare Other | Admitting: Urology

## 2019-04-25 DIAGNOSIS — N400 Enlarged prostate without lower urinary tract symptoms: Secondary | ICD-10-CM

## 2019-04-25 DIAGNOSIS — N5201 Erectile dysfunction due to arterial insufficiency: Secondary | ICD-10-CM | POA: Diagnosis not present

## 2019-04-25 DIAGNOSIS — R972 Elevated prostate specific antigen [PSA]: Secondary | ICD-10-CM

## 2019-06-20 ENCOUNTER — Ambulatory Visit: Payer: Medicare Other

## 2019-06-26 ENCOUNTER — Other Ambulatory Visit: Payer: Self-pay

## 2019-06-26 DIAGNOSIS — R972 Elevated prostate specific antigen [PSA]: Secondary | ICD-10-CM

## 2019-06-27 ENCOUNTER — Encounter: Payer: Medicare Other | Admitting: Family Medicine

## 2019-07-03 ENCOUNTER — Other Ambulatory Visit: Payer: Self-pay

## 2019-07-03 ENCOUNTER — Ambulatory Visit (INDEPENDENT_AMBULATORY_CARE_PROVIDER_SITE_OTHER): Payer: Medicare Other | Admitting: Family Medicine

## 2019-07-03 ENCOUNTER — Encounter: Payer: Self-pay | Admitting: Family Medicine

## 2019-07-03 VITALS — BP 140/90 | HR 70 | Resp 15 | Ht 69.0 in | Wt 164.0 lb

## 2019-07-03 DIAGNOSIS — Z0000 Encounter for general adult medical examination without abnormal findings: Secondary | ICD-10-CM | POA: Diagnosis not present

## 2019-07-03 NOTE — Patient Instructions (Addendum)
[name] , Thank you for taking time to come for your Medicare Wellness Visit. I appreciate your ongoing commitment to your health goals. Please review the following plan we discussed and let me know if I can assist you in the future.   Please continue to practice social distancing to keep you, your family, and our community safe.  If you must go out, please wear a Mask and practice good handwashing.  Screening recommendations/referrals: Colonoscopy: Due 2023 Recommended yearly ophthalmology/optometry visit for glaucoma screening and checkup Recommended yearly dental visit for hygiene and checkup  Vaccinations: Influenza vaccine: declined Pneumococcal vaccine: will need in next several years Tdap vaccine: needed Shingles vaccine:  Checking coverage  Advanced directives:  declined  Conditions/risks identified: Falls   Next appointment: Needs appt with Dr Moshe Cipro around May and 1 yr for AWV  Preventive Care 40-64 Years, Male Preventive care refers to lifestyle choices and visits with your health care provider that can promote health and wellness. What does preventive care include?  A yearly physical exam. This is also called an annual well check.  Dental exams once or twice a year.  Routine eye exams. Ask your health care provider how often you should have your eyes checked.  Personal lifestyle choices, including:  Daily care of your teeth and gums.  Regular physical activity.  Eating a healthy diet.  Avoiding tobacco and drug use.  Limiting alcohol use.  Practicing safe sex.  Taking low-dose aspirin every day starting at age 12. What happens during an annual well check? The services and screenings done by your health care provider during your annual well check will depend on your age, overall health, lifestyle risk factors, and family history of disease. Counseling  Your health care provider may ask you questions about your:  Alcohol use.  Tobacco  use.  Drug use.  Emotional well-being.  Home and relationship well-being.  Sexual activity.  Eating habits.  Work and work Statistician. Screening  You may have the following tests or measurements:  Height, weight, and BMI.  Blood pressure.  Lipid and cholesterol levels. These may be checked every 5 years, or more frequently if you are over 18 years old.  Skin check.  Lung cancer screening. You may have this screening every year starting at age 78 if you have a 30-pack-year history of smoking and currently smoke or have quit within the past 15 years.  Fecal occult blood test (FOBT) of the stool. You may have this test every year starting at age 61.  Flexible sigmoidoscopy or colonoscopy. You may have a sigmoidoscopy every 5 years or a colonoscopy every 10 years starting at age 38.  Prostate cancer screening. Recommendations will vary depending on your family history and other risks.  Hepatitis C blood test.  Hepatitis B blood test.  Sexually transmitted disease (STD) testing.  Diabetes screening. This is done by checking your blood sugar (glucose) after you have not eaten for a while (fasting). You may have this done every 1-3 years. Discuss your test results, treatment options, and if necessary, the need for more tests with your health care provider. Vaccines  Your health care provider may recommend certain vaccines, such as:  Influenza vaccine. This is recommended every year.  Tetanus, diphtheria, and acellular pertussis (Tdap, Td) vaccine. You may need a Td booster every 10 years.  Zoster vaccine. You may need this after age 27.  Pneumococcal 13-valent conjugate (PCV13) vaccine. You may need this if you have certain conditions and have not  been vaccinated.  Pneumococcal polysaccharide (PPSV23) vaccine. You may need one or two doses if you smoke cigarettes or if you have certain conditions. Talk to your health care provider about which screenings and vaccines you  need and how often you need them. This information is not intended to replace advice given to you by your health care provider. Make sure you discuss any questions you have with your health care provider. Document Released: 06/25/2015 Document Revised: 02/16/2016 Document Reviewed: 03/30/2015 Elsevier Interactive Patient Education  2017 [employer] Prevention in the Home Falls can cause injuries. They can happen to people of all ages. There are many things you can do to make your home safe and to help prevent falls. What can I do on the outside of my home?  Regularly fix the edges of walkways and driveways and fix any cracks.  Remove anything that might make you trip as you walk through a door, such as a raised step or threshold.  Trim any bushes or trees on the path to your home.  Use bright outdoor lighting.  Clear any walking paths of anything that might make someone trip, such as rocks or tools.  Regularly check to see if handrails are loose or broken. Make sure that both sides of any steps have handrails.  Any raised decks and porches should have guardrails on the edges.  Have any leaves, snow, or ice cleared regularly.  Use sand or salt on walking paths during winter.  Clean up any spills in your garage right away. This includes oil or grease spills. What can I do in the bathroom?  Use night lights.  Install grab bars by the toilet and in the tub and shower. Do not use towel bars as grab bars.  Use non-skid mats or decals in the tub or shower.  If you need to sit down in the shower, use a plastic, non-slip stool.  Keep the floor dry. Clean up any water that spills on the floor as soon as it happens.  Remove soap buildup in the tub or shower regularly.  Attach bath mats securely with double-sided non-slip rug tape.  Do not have throw rugs and other things on the floor that can make you trip. What can I do in the bedroom?  Use night lights.  Make sure  that you have a light by your bed that is easy to reach.  Do not use any sheets or blankets that are too big for your bed. They should not hang down onto the floor.  Have a firm chair that has side arms. You can use this for support while you get dressed.  Do not have throw rugs and other things on the floor that can make you trip. What can I do in the kitchen?  Clean up any spills right away.  Avoid walking on wet floors.  Keep items that you use a lot in easy-to-reach places.  If you need to reach something above you, use a strong step stool that has a grab bar.  Keep electrical cords out of the way.  Do not use floor polish or wax that makes floors slippery. If you must use wax, use non-skid floor wax.  Do not have throw rugs and other things on the floor that can make you trip. What can I do with my stairs?  Do not leave any items on the stairs.  Make sure that there are handrails on both sides of the stairs and use them.  Fix handrails that are broken or loose. Make sure that handrails are as long as the stairways.  Check any carpeting to make sure that it is firmly attached to the stairs. Fix any carpet that is loose or worn.  Avoid having throw rugs at the top or bottom of the stairs. If you do have throw rugs, attach them to the floor with carpet tape.  Make sure that you have a light switch at the top of the stairs and the bottom of the stairs. If you do not have them, ask someone to add them for you. What else can I do to help prevent falls?  Wear shoes that:  Do not have high heels.  Have rubber bottoms.  Are comfortable and fit you well.  Are closed at the toe. Do not wear sandals.  If you use a stepladder:  Make sure that it is fully opened. Do not climb a closed stepladder.  Make sure that both sides of the stepladder are locked into place.  Ask someone to hold it for you, if possible.  Clearly mark and make sure that you can see:  Any grab bars or  handrails.  First and last steps.  Where the edge of each step is.  Use tools that help you move around (mobility aids) if they are needed. These include:  Canes.  Walkers.  Scooters.  Crutches.  Turn on the lights when you go into a dark area. Replace any light bulbs as soon as they burn out.  Set up your furniture so you have a clear path. Avoid moving your furniture around.  If any of your floors are uneven, fix them.  If there are any pets around you, be aware of where they are.  Review your medicines with your doctor. Some medicines can make you feel dizzy. This can increase your chance of falling. Ask your doctor what other things that you can do to help prevent falls. This information is not intended to replace advice given to you by your health care provider. Make sure you discuss any questions you have with your health care provider. Document Released: 03/25/2009 Document Revised: 11/04/2015 Document Reviewed: 07/03/2014 Elsevier Interactive Patient Education  2017 [employer].

## 2019-07-03 NOTE — Progress Notes (Signed)
Subjective:   Craig Shaffer is a 59 y.o. male who presents for Medicare Annual/Subsequent preventive examination.  Location of Patient: Home Location of Provider: Telehealth Consent was obtain for visit to be over via telehealth.  I verified that I am speaking with the correct person using two identifiers.   Review of Systems:    Cardiac Risk Factors include: hypertension;male gender     Objective:    Vitals: BP 140/90   Pulse 70   Resp 15   Ht 5\' 9"  (1.753 m)   Wt 164 lb (74.4 kg)   BMI 24.22 kg/m   Body mass index is 24.22 kg/m.  Advanced Directives 06/17/2018 07/18/2017 06/22/2016 05/11/2016 05/11/2016 05/11/2016 05/10/2016  Does Patient Have a Medical Advance Directive? No No No - Yes No No  Does patient want to make changes to medical advance directive? - - No - Patient declined - - - -  Would patient like information on creating a medical advance directive? No - Patient declined No - Patient declined - No - Patient declined - No - Patient declined -    Tobacco Social History   Tobacco Use  Smoking Status Former Smoker  . Packs/day: 0.25  . Years: 20.00  . Pack years: 5.00  . Types: Cigarettes  Smokeless Tobacco Never Used  Tobacco Comment         Counseling given: Yes Comment:     Clinical Intake:  Pre-visit preparation completed: Yes  Pain : No/denies pain Pain Score: 0-No pain     BMI - recorded: 24.22 Nutritional Status: BMI of 19-24  Normal Nutritional Risks: None Diabetes: No CBG done?: No Did pt. bring in CBG monitor from home?: No  How often do you need to have someone help you when you read instructions, pamphlets, or other written materials from your doctor or pharmacy?: 1 - Never What is the last grade level you completed in school?: 12  Interpreter Needed?: No     Past Medical History:  Diagnosis Date  . Anemia 2012 MCV 76.2 HB 12.3 CR 1.0-1.24   FERRITIN 14   . Demyelinating disease of central nervous system ([place])   .  ED (erectile dysfunction)   . Elevated PSA   . GERD (gastroesophageal reflux disease)   . Hypertension   . Nicotine addiction    Past Surgical History:  Procedure Laterality Date  . APPENDECTOMY  04/2016  . CHOLECYSTECTOMY  5/07  . COLONOSCOPY  2002 [facility] POLYP  . COLONOSCOPY  JAN 2013 [facility] ANEMIA   H POLYP, IH  . GIVENS CAPSULE STUDY  10/05/2011   Procedure: GIVENS CAPSULE STUDY;  Surgeon: Danie Binder, MD;  Location: [facility];  Service: Endoscopy;  Laterality: N/A;  . LAPAROSCOPIC APPENDECTOMY N/A 05/11/2016   Procedure: APPENDECTOMY LAPAROSCOPIC;  Surgeon: Aviva Signs, MD;  Location: [facility] ORS;  Service: General;  Laterality: N/A;  . UPPER GASTROINTESTINAL ENDOSCOPY  JAN 2013 ANEMIA   GASTRITIS, NL DUO Bx   Family History  Problem Relation Age of Onset  . Hypertension Mother   . Cancer Mother 68       metastatized to liver, died at age 41  . Hypertension Sister   . Hypertension Brother   . Hypertension Sister   . Hypertension Sister   . Stroke Sister   . Hypertension Sister   . Heart disease Sister 29       stents in 4 valves, ruptd aorta  . Hypertension Brother   .  Colon cancer Neg Hx    Social History   Socioeconomic History  . Marital status: Single    Spouse name: Not on file  . Number of children: 0  . Years of education: 35  . Highest education level: 12th grade  Occupational History    Employer: UNEMPLOYED  Tobacco Use  . Smoking status: Former Smoker    Packs/day: 0.25    Years: 20.00    Pack years: 5.00    Types: Cigarettes  . Smokeless tobacco: Never Used  . Tobacco comment:    Substance and Sexual Activity  . Alcohol use: No    Alcohol/week: 0.0 standard drinks  . Drug use: No  . Sexual activity: Yes  Other Topics Concern  . Not on file  Social History Narrative   Lives with sister and aunt. Aunt passed away two weeks ago    Social Determinants of Health   Financial Resource Strain:   . Difficulty of Paying Living Expenses:  Not on file  Food Insecurity:   . Worried About Charity fundraiser in the Last Year: Not on file  . Ran Out of Food in the Last Year: Not on file  Transportation Needs:   . Lack of Transportation (Medical): Not on file  . Lack of Transportation (Non-Medical): Not on file  Physical Activity:   . Days of Exercise per Week: Not on file  . Minutes of Exercise per Session: Not on file  Stress:   . Feeling of Stress : Not on file  Social Connections:   . Frequency of Communication with Friends and Family: Not on file  . Frequency of Social Gatherings with Friends and Family: Not on file  . Attends Religious Services: Not on file  . Active Member of Clubs or Organizations: Not on file  . Attends Archivist Meetings: Not on file  . Marital Status: Not on file    Outpatient Encounter Medications as of 07/03/2019  Medication Sig  . diltiazem (TIAZAC) 360 MG 24 hr capsule TAKE 1 CAPSULE BY MOUTH EVERY DAY   No facility-administered encounter medications on file as of 07/03/2019.    Activities of Daily Living In your present state of health, do you have any difficulty performing the following activities: 07/03/2019  Hearing? N  Vision? N  Difficulty concentrating or making decisions? N  Walking or climbing stairs? N  Dressing or bathing? N  Doing errands, shopping? N  Preparing Food and eating ? N  Using the Toilet? N  In the past six months, have you accidently leaked urine? N  Do you have problems with loss of bowel control? N  Managing your Medications? N  Managing your Finances? N  Housekeeping or managing your Housekeeping? N  Some recent data might be hidden    Patient Care Team: Fayrene Helper, MD as PCP - [facility], Marga Melnick, MD (Gastroenterology) Irine Seal, MD as Attending Physician (Urology)   Assessment:   This is a routine wellness examination for Craig Shaffer.  Exercise Activities and Dietary recommendations Current Exercise Habits: Home exercise  routine, Type of exercise: walking;stretching;strength training/weights, Time (Minutes): 30, Frequency (Times/Week): 3, Weekly Exercise (Minutes/Week): 90, Intensity: Moderate, Exercise limited by: None identified  Goals    . DIET - DECREASE SODA OR JUICE INTAKE    . Quit smoking / using tobacco     Starting 06/22/2016 I would like to continue slowly weaning myself off of cigarettes. By the end of Februrary I would like to be  down to 2-3 cigarettes a day.       Fall Risk Fall Risk  07/03/2019 03/13/2019 12/17/2018 10/31/2018 06/17/2018  Falls in the past year? 0 0 0 0 0  Number falls in past yr: 0 0 0 0 -  Injury with Fall? 0 0 0 0 -  Risk for fall due to : - - - - Impaired balance/gait   Is the patient's home free of loose throw rugs in walkways, pet beds, electrical cords, etc?   yes      Grab bars in the bathroom? yes       Handrails on the stairs?   yes      Adequate lighting?   yes     Depression Screen PHQ 2/9 Scores 07/03/2019 03/13/2019 12/17/2018 10/31/2018  PHQ - 2 Score 0 0 0 0    Cognitive Function     6CIT Screen 07/03/2019 06/17/2018 07/18/2017 06/22/2016  What Year? 0 points 0 points 0 points 0 points  What month? 0 points 0 points 0 points 0 points  What time? 0 points 0 points 0 points 0 points  Count back from 20 0 points 0 points 0 points 0 points  Months in reverse 0 points 0 points 0 points 0 points  Repeat phrase 0 points 0 points 0 points 2 points  Total Score 0 0 0 2    Immunization History  Administered Date(s) Administered  . Influenza,inj,Quad PF,6+ Mos 04/04/2013, 03/16/2014, 03/10/2015  . Td 02/18/2004    Qualifies for Shingles Vaccine? Not completed  Screening Tests Health Maintenance  Topic Date Due  . TETANUS/TDAP  02/17/2014  . INFLUENZA VACCINE  09/10/2019 (Originally 01/11/2019)  . COLONOSCOPY  06/22/2021  . Hepatitis C Screening  Completed  . HIV Screening  Completed   Cancer Screenings: Lung: Low Dose CT Chest recommended if Age 61-80 years,  30 pack-year currently smoking OR have quit w/in 15years. Patient does not qualify. Colorectal:  Due 2023 Additional Screenings:   Hepatitis C Screening:  Completed       Plan:       1. Encounter for Medicare annual wellness exam   I have personally reviewed and noted the following in the patient's chart:   . Medical and social history . Use of alcohol, tobacco or illicit drugs  . Current medications and supplements . Functional ability and status . Nutritional status . Physical activity . Advanced directives . List of other physicians . Hospitalizations, surgeries, and ER visits in previous 12 months . Vitals . Screenings to include cognitive, depression, and falls . Referrals and appointments  In addition, I have reviewed and discussed with patient certain preventive protocols, quality metrics, and best practice recommendations. A written personalized care plan for preventive services as well as general preventive health recommendations were provided to patient.   I provided 20 minutes of non-face-to-face time during this encounter.    Perlie Mayo, NP  07/03/2019

## 2019-07-06 ENCOUNTER — Other Ambulatory Visit: Payer: Self-pay | Admitting: Family Medicine

## 2019-09-24 ENCOUNTER — Other Ambulatory Visit: Payer: Self-pay

## 2019-09-24 ENCOUNTER — Ambulatory Visit (INDEPENDENT_AMBULATORY_CARE_PROVIDER_SITE_OTHER): Payer: Medicare Other | Admitting: Family Medicine

## 2019-09-24 ENCOUNTER — Encounter: Payer: Self-pay | Admitting: Family Medicine

## 2019-09-24 VITALS — BP 140/72 | HR 100 | Temp 98.1°F | Ht 69.0 in | Wt 171.2 lb

## 2019-09-24 DIAGNOSIS — T7840XA Allergy, unspecified, initial encounter: Secondary | ICD-10-CM | POA: Diagnosis not present

## 2019-09-24 MED ORDER — OLOPATADINE HCL 0.2 % OP SOLN: 1.0000 [drp] | Freq: Every day | OPHTHALMIC | 0 refills | Status: DC

## 2019-09-24 MED ORDER — CETIRIZINE HCL 10 MG PO TABS: 10.0000 mg | ORAL_TABLET | Freq: Every day | ORAL | 1 refills | Status: DC

## 2019-09-24 NOTE — Patient Instructions (Addendum)
   Use eye drop once a day Take zyrtec -one at night Saline eye drops for irritation If symptoms change including blurred vision , worse swelling , drainage or other concern, contact clinic   If you have lab work done today you will be contacted with your lab results within the next 2 weeks.  If you have not heard from [place] then please contact us. The fastest way to get your results is to register for My Chart.   IF you received an x-ray today, you will receive an invoice from [facility]. Please contact [facility] at [phone] with questions or concerns regarding your invoice.   IF you received labwork today, you will receive an invoice from [place]. Please contact [facility] at [phone] with questions or concerns regarding your invoice.   Our billing staff will not be able to assist you with questions regarding bills from these companies.  You will be contacted with the lab results as soon as they are available. The fastest way to get your results is to activate your My Chart account. Instructions are located on the last page of this paperwork. If you have not heard from [place] regarding the results in 2 weeks, please contact this office.

## 2019-09-24 NOTE — Progress Notes (Signed)
Acute Office Visit  Subjective:    Patient ID: Craig Shaffer, male    DOB: 1960/08/21, 59 y.o.   MRN: [id number]  Chief Complaint  Patient presents with  . Belepharitis     left eye swollen on the top left corner, some watery eye x 2 days, running nose x1 week    HPI Patient is here today for itchy left eye with swelling of the upper lid. Pt states eyes always water.  Pt often has allergies during the Springtime of the year. Pt with no drainage noted. Pt with no pain in the eye.No visual changes.Pt states no injury. Pt using otc eye drops-saline. Pt with nasal drainage. No sinus pressure . No cough. Pt occasionally has taken otc allergy medication in the past.  Past Medical History:  Diagnosis Date  . Anemia 2012 MCV 76.2 HB 12.3 CR 1.0-1.24   FERRITIN 14   . Demyelinating disease of central nervous system ([place])   . ED (erectile dysfunction)   . Elevated PSA   . GERD (gastroesophageal reflux disease)   . Hypertension   . Nicotine addiction     Past Surgical History:  Procedure Laterality Date  . APPENDECTOMY  04/2016  . CHOLECYSTECTOMY  5/07  . COLONOSCOPY  2002 [facility] POLYP  . COLONOSCOPY  JAN 2013 [facility] ANEMIA   H POLYP, IH  . GIVENS CAPSULE STUDY  10/05/2011   Procedure: GIVENS CAPSULE STUDY;  Surgeon: Danie Binder, MD;  Location[facility];  Service: Endoscopy;  Laterality: N/A;  . LAPAROSCOPIC APPENDECTOMY N/A 05/11/2016   Procedure: APPENDECTOMY LAPAROSCOPIC;  Surgeon: Aviva Signs, MD;  Location: [facility] OR[facility];  Service: General;  Laterality: N/A;  . UPPER GASTROINTESTINAL ENDOSCOPY  JAN 2013 ANEMIA   GASTRITIS, NL DUO Bx    Family History  Problem Relation Age of Onset  . Hypertension Mother   . Cancer Mother 2       metastatized to liver, died at age 21  . Hypertension Sister   . Hypertension Brother   . Hypertension Sister   . Hypertension Sister   . Stroke Sister   . Hypertension Sister   . Heart disease Sister 37       stents in 4 valves,  ruptd aorta  . Hypertension Brother   . Colon cancer Neg Hx     Social History   Socioeconomic History  . Marital status: Single    Spouse name: Not on file  . Number of children: 0  . Years of education: 63  . Highest education level: 12th grade  Occupational History    Employer: UNEMPLOYED  Tobacco Use  . Smoking status: Former Smoker    Packs/day: 0.25    Years: 20.00    Pack years: 5.00    Types: Cigarettes  . Smokeless tobacco: Never Used  . Tobacco comment:    Substance and Sexual Activity  . Alcohol use: No    Alcohol/week: 0.0 standard drinks  . Drug use: No  . Sexual activity: Yes  Other Topics Concern  . Not on file  Social History Narrative   Lives with sister and aunt. Aunt passed away two weeks ago    Social Determinants of Health   Financial Resource Strain:   . Difficulty of Paying Living Expenses:   Food Insecurity:   . Worried About Charity fundraiser in the Last Year:   . Arboriculturist in the Last Year:   Transportation Needs:   .  Lack of Transportation (Medical):   Marland Kitchen Lack of Transportation (Non-Medical):   Physical Activity:   . Days of Exercise per Week:   . Minutes of Exercise per Session:   Stress:   . Feeling of Stress :   Social Connections:   . Frequency of Communication with Friends and Family:   . Frequency of Social Gatherings with Friends and Family:   . Attends Religious Services:   . Active Member of Clubs or Organizations:   . Attends Archivist Meetings:   Marland Kitchen Marital Status:   Intimate Partner Violence:   . Fear of Current or Ex-Partner:   . Emotionally Abused:   Marland Kitchen Physically Abused:   . Sexually Abused:     Outpatient Medications Prior to Visit  Medication Sig Dispense Refill  . diltiazem (TIAZAC) 360 MG 24 hr capsule TAKE 1 CAPSULE BY MOUTH EVERY DAY 90 capsule 0   No facility-administered medications prior to visit.    No Known Allergies  Review of Systems  Constitutional: Negative for fatigue and  fever.  Eyes: Positive for itching. Negative for photophobia, pain, redness and visual disturbance.       Clear drainage  Respiratory: Negative.  Negative for cough and shortness of breath.   Allergic/Immunologic: Positive for environmental allergies.       Objective:    Physical Exam Vitals reviewed.  Constitutional:      Appearance: Normal appearance. He is normal weight.  HENT:     Head: Normocephalic and atraumatic.      Comments: Swelling noted left upper lid-no lesion, no stye Non injected    Nose: Nose normal.  Eyes:     Extraocular Movements: Extraocular movements intact.     Conjunctiva/sclera: Conjunctivae normal.     Pupils: Pupils are equal, round, and reactive to light.      Comments: Swelling left upper lid-no eryth, no mass, no stye  Neurological:     Mental Status: He is alert.     BP 140/72 (BP Location: Right Arm, Patient Position: Sitting, Cuff Size: Normal)   Pulse 100   Temp 98.1 F (36.7 C) (Temporal)   Ht 5\' 9"  (1.753 m)   Wt 171 lb 3.2 oz (77.7 kg)   SpO2 97%   BMI 25.28 kg/m  Wt Readings from Last 3 Encounters:  09/24/19 171 lb 3.2 oz (77.7 kg)  07/03/19 164 lb (74.4 kg)  03/13/19 155 lb (70.3 kg)    Health Maintenance Due  Topic Date Due  . TETANUS/TDAP  02/17/2014    Lab Results  Component Value Date   TSH 1.51 11/19/2018   Lab Results  Component Value Date   WBC 4.6 11/19/2018   HGB 12.8 (L) 11/19/2018   HCT 37.4 (L) 11/19/2018   MCV 87.4 11/19/2018   PLT 224 11/19/2018   Lab Results  Component Value Date   NA 140 11/19/2018   K 3.9 11/19/2018   CO2 24 11/19/2018   GLUCOSE 92 11/19/2018   BUN 9 11/19/2018   CREATININE 0.98 11/19/2018   BILITOT 0.3 11/19/2018   ALKPHOS 69 05/10/2016   AST 16 11/19/2018   ALT 14 11/19/2018   PROT 6.8 11/19/2018   ALBUMIN 3.9 05/10/2016   CALCIUM 8.5 (L) 11/19/2018   ANIONGAP 7 05/10/2016   Lab Results  Component Value Date   CHOL 161 11/19/2018   Lab Results  Component  Value Date   HDL 45 11/19/2018   Lab Results  Component Value Date   LDLCALC 100 (H)  11/19/2018   Lab Results  Component Value Date   TRIG 69 11/19/2018   Lab Results  Component Value Date   CHOLHDL 3.6 11/19/2018   Lab Results  Component Value Date   HGBA1C 5.7 (H) 08/09/2015       Assessment & Plan:   1. Allergic reaction, initial encounter Zyrtec, Olopatadine soln-rx-d/w pt blurred vision, increase swelling , redness or other concerns -repeat evaluation    Skyylar Kopf Hannah Beat, MD

## 2019-10-03 ENCOUNTER — Other Ambulatory Visit: Payer: Self-pay | Admitting: Family Medicine

## 2019-10-27 ENCOUNTER — Ambulatory Visit: Payer: Medicare Other | Admitting: Family Medicine

## 2019-11-11 ENCOUNTER — Encounter: Payer: Self-pay | Admitting: Internal Medicine

## 2019-11-11 ENCOUNTER — Other Ambulatory Visit: Payer: Self-pay

## 2019-11-11 ENCOUNTER — Ambulatory Visit (INDEPENDENT_AMBULATORY_CARE_PROVIDER_SITE_OTHER): Payer: Medicare Other | Admitting: Internal Medicine

## 2019-11-11 VITALS — BP 120/76 | HR 64 | Temp 99.4°F | Resp 15 | Ht 69.0 in | Wt 167.0 lb

## 2019-11-11 DIAGNOSIS — Z720 Tobacco use: Secondary | ICD-10-CM | POA: Diagnosis not present

## 2019-11-11 DIAGNOSIS — I10 Essential (primary) hypertension: Secondary | ICD-10-CM

## 2019-11-11 DIAGNOSIS — Z13220 Encounter for screening for lipoid disorders: Secondary | ICD-10-CM

## 2019-11-11 DIAGNOSIS — S81802A Unspecified open wound, left lower leg, initial encounter: Secondary | ICD-10-CM

## 2019-11-11 DIAGNOSIS — R7302 Impaired glucose tolerance (oral): Secondary | ICD-10-CM

## 2019-11-11 NOTE — Patient Instructions (Signed)
No change in medicine Labs: CBC, CMP, Lipid panel, A1c Proud that you are trying to quit smoking! Keep it up!! Discussed about DASH Diet Encourage to exercise atleast 3 times/week Encourged to get COVID vaccine asp. Left leg wound: apply OTC triple antibiotic gel & keep it dry Follow up in 3 months

## 2019-11-11 NOTE — Progress Notes (Addendum)
Established Patient Office Visit  Subjective:  Patient ID: Craig Shaffer, male    DOB: Aug 16, 1960  Age: 59 y.o. MRN: [id number]  CC:  Chief Complaint  Patient presents with   Hypertension    follow up    HPI Craig Shaffer a pleasant 59 year old male with past medical history of hypertension, tobacco abuse more demyelinating disease of CNS presents to office for follow-up on his chronic medical issues.  Patient tells me that he is doing fine overall.  Was hit by corner of table last week and had wound on anterior aspect of left leg.  Reports that it is healing very well and denies association with severe pain, bleeding or discharge or fever/chills.  Has been applying lotion which helps in healing.  His blood pressure is well controlled on diltiazem which he has been taking for many years.  Denies headache, blurry vision, chest pain, shortness of breath, palpitation, leg swelling, nausea, vomiting, diarrhea, decreased appetite, generalized weakness or lethargy.  He has been trying really hard to quit smoking.  Currently he smokes 5 cigarettes/day, denies alcohol, illicit drug use.    Past Medical History:  Diagnosis Date   Anemia 2012 MCV 76.2 HB 12.3 CR 1.0-1.24   FERRITIN 14    Demyelinating disease of central nervous system [facility])    ED (erectile dysfunction)    Elevated PSA    GERD (gastroesophageal reflux disease)    Hypertension    Nicotine addiction     Past Surgical History:  Procedure Laterality Date   APPENDECTOMY  04/2016   CHOLECYSTECTOMY  5/07   COLONOSCOPY  2002 DR SMITH   [place] POLYP   COLONOSCOPY  JAN 2013 [facility] ANEMIA   H POLYP, IH   GIVENS CAPSULE STUDY  10/05/2011   Procedure: GIVENS CAPSULE STUDY;  Surgeon: Danie Binder, MD;  Location[facility];  Service: Endoscopy;  Laterality: N/A;   LAPAROSCOPIC APPENDECTOMY N/A 05/11/2016   Procedure: APPENDECTOMY LAPAROSCOPIC;  Surgeon: Aviva Signs, MD;  Location: [facility] ORS;  Service:  General;  Laterality: N/A;   UPPER GASTROINTESTINAL ENDOSCOPY  JAN 2013 ANEMIA   GASTRITIS, NL DUO Bx    Family History  Problem Relation Age of Onset   Hypertension Mother    Cancer Mother 77       metastatized to liver, died at age 60   Hypertension Sister    Hypertension Brother    Hypertension Sister    Hypertension Sister    Stroke Sister    Hypertension Sister    Heart disease Sister 59       stents in 4 valves, ruptd aorta   Hypertension Brother    Colon cancer Neg Hx     Social History   Socioeconomic History   Marital status: Single    Spouse name: Not on file   Number of children: 0   Years of education: 12   Highest education level: 12th grade  Occupational History    Employer: UNEMPLOYED  Tobacco Use   Smoking status: Former Smoker    Packs/day: 0.25    Years: 20.00    Pack years: 5.00    Types: Cigarettes   Smokeless tobacco: Never Used   Tobacco comment:    Substance and Sexual Activity   Alcohol use: No    Alcohol/week: 0.0 standard drinks   Drug use: No   Sexual activity: Yes  Other Topics Concern   Not on file  Social History Narrative   Lives with sister and  aunt. Elenor Legato passed away two weeks ago    Social Determinants of Health   Financial Resource Strain:    Difficulty of Paying Living Expenses:   Food Insecurity:    Worried About Charity fundraiser in the Last Year:    Arboriculturist in the Last Year:   Transportation Needs:    Film/video editor (Medical):    Lack of Transportation (Non-Medical):   Physical Activity:    Days of Exercise per Week:    Minutes of Exercise per Session:   Stress:    Feeling of Stress :   Social Connections:    Frequency of Communication with Friends and Family:    Frequency of Social Gatherings with Friends and Family:    Attends Religious Services:    Active Member of Clubs or Organizations:    Attends Music therapist:    Marital Status:     Intimate Partner Violence:    Fear of Current or Ex-Partner:    Emotionally Abused:    Physically Abused:    Sexually Abused:     Outpatient Medications Prior to Visit  Medication Sig Dispense Refill   cetirizine (ZYRTEC) 10 MG tablet Take 1 tablet (10 mg total) by mouth daily. 30 tablet 1   diltiazem (TIAZAC) 360 MG 24 hr capsule TAKE 1 CAPSULE BY MOUTH EVERY DAY 90 capsule 0   Olopatadine HCl 0.2 % SOLN Apply 1 drop to eye daily. 2.5 mL 0   No facility-administered medications prior to visit.    No Known Allergies  ROS Review of Systems  Constitutional: Negative.   HENT: Negative.   Eyes: Negative.   Respiratory: Negative.   Cardiovascular: Negative.   Gastrointestinal: Negative.   Endocrine: Negative.   Genitourinary: Negative.   Musculoskeletal: Negative.   Allergic/Immunologic: Negative.   Neurological: Negative.   Hematological: Negative.   Psychiatric/Behavioral: Negative.       Objective:    Physical Exam  Constitutional: He is oriented to person, place, and time. He appears well-developed and well-nourished.  HENT:  Head: Normocephalic and atraumatic.  Eyes: Pupils are equal, round, and reactive to light. Conjunctivae and EOM are normal.  Cardiovascular: Normal rate, regular rhythm and normal heart sounds.  Pulmonary/Chest: Effort normal and breath sounds normal.  Abdominal: Soft. Bowel sounds are normal.  Musculoskeletal:        General: Normal range of motion.     Cervical back: Normal range of motion and neck supple.  Neurological: He is alert and oriented to person, place, and time. He has normal reflexes.  Skin:  Left leg: Well-healed scab noted on anterior aspect of left lower leg.  No bleeding or discharge or tenderness noted on exam.  Psychiatric: He has a normal mood and affect. His behavior is normal. Judgment and thought content normal.    BP 120/76    Pulse 64    Temp 99.4 F (37.4 C) (Temporal)    Resp 15    Ht 5\' 9"  (1.753 m)     Wt 167 lb (75.8 kg)    SpO2 98%    BMI 24.66 kg/m  Wt Readings from Last 3 Encounters:  11/11/19 167 lb (75.8 kg)  09/24/19 171 lb 3.2 oz (77.7 kg)  07/03/19 164 lb (74.4 kg)     Health Maintenance Due  Topic Date Due   COVID-19 Vaccine (1) Never done   TETANUS/TDAP  02/17/2014    There are no preventive care reminders to display for this patient.  Lab Results  Component Value Date   TSH 1.51 11/19/2018   Lab Results  Component Value Date   WBC 4.6 11/19/2018   HGB 12.8 (L) 11/19/2018   HCT 37.4 (L) 11/19/2018   MCV 87.4 11/19/2018   PLT 224 11/19/2018   Lab Results  Component Value Date   NA 140 11/19/2018   K 3.9 11/19/2018   CO2 24 11/19/2018   GLUCOSE 92 11/19/2018   BUN 9 11/19/2018   CREATININE 0.98 11/19/2018   BILITOT 0.3 11/19/2018   ALKPHOS 69 05/10/2016   AST 16 11/19/2018   ALT 14 11/19/2018   PROT 6.8 11/19/2018   ALBUMIN 3.9 05/10/2016   CALCIUM 8.5 (L) 11/19/2018   ANIONGAP 7 05/10/2016   Lab Results  Component Value Date   CHOL 161 11/19/2018   Lab Results  Component Value Date   HDL 45 11/19/2018   Lab Results  Component Value Date   LDLCALC 100 (H) 11/19/2018   Lab Results  Component Value Date   TRIG 69 11/19/2018   Lab Results  Component Value Date   CHOLHDL 3.6 11/19/2018   Lab Results  Component Value Date   HGBA1C 5.7 (H) 08/09/2015      Assessment & Plan:   Problem List Items Addressed This Visit      Cardiovascular and Mediastinum   Essential hypertension - Primary     Other   Tobacco abuse   Leg wound, left     Essential hypertension: Well-controlled -Continue diltiazem. -Discussed about DASH diet -We will check labs today including CBC, CMP, lipid panel, A1c -Follow-up in 3 months  Tobacco abuse: -Asked:confirms currently smokes cigarettes Assess: willing to quit Advise: needs to QUIT to reduce risk of cancer, cardio and cerebrovascular disease Assist: counseled for 5 minutes and literature  provided Arrange: follow up in 3 months  Left leg wound: Healing very well. -Advised to apply over-the-counter triple antibiotic ointment.  Keep the wound dry. -Tylenol as needed for pain control.  Allergies: Continue Zyrtec as needed. Cont. olopatadine eyedrops (prescribed by ophthalmologist)  No orders of the defined types were placed in this encounter.   Follow-up: Return in about 3 months (around 02/11/2020).    Mckinley Jewel, MD

## 2019-11-12 ENCOUNTER — Encounter: Payer: Self-pay | Admitting: Internal Medicine

## 2019-11-13 ENCOUNTER — Other Ambulatory Visit: Payer: Self-pay | Admitting: Internal Medicine

## 2019-11-13 LAB — LIPID PANEL
Cholesterol: 171 mg/dL (ref <200)
HDL: 43 mg/dL (ref >=40)
LDL Cholesterol (Calc): 111 mg/dL (calc) — ABNORMAL HIGH
Non-HDL Cholesterol (Calc): 128 mg/dL (calc) (ref <130)
Total CHOL/HDL Ratio: 4 (calc) (ref <5.0)
Triglycerides: 82 mg/dL (ref <150)

## 2019-11-13 LAB — COMPLETE METABOLIC PANEL WITH GFR
AG Ratio: 1.1 (calc) (ref 1.0–2.5)
ALT: 14 U/L (ref 9–46)
AST: 15 U/L (ref 10–35)
Albumin: 3.8 g/dL (ref 3.6–5.1)
Alkaline phosphatase (APISO): 98 U/L (ref 35–144)
BUN: 8 mg/dL (ref 7–25)
CO2: 28 mmol/L (ref 20–32)
Calcium: 8.8 mg/dL (ref 8.6–10.3)
Chloride: 105 mmol/L (ref 98–110)
Creat: 1.07 mg/dL (ref 0.70–1.25)
GFR, Est African American: 86 mL/min/{1.73_m2} (ref >=60)
GFR, Est Non African American: 75 mL/min/{1.73_m2} (ref >=60)
Globulin: 3.4 g/dL (calc) (ref 1.9–3.7)
Glucose, Bld: 89 mg/dL (ref 65–99)
Potassium: 4.4 mmol/L (ref 3.5–5.3)
Sodium: 138 mmol/L (ref 135–146)
Total Bilirubin: 0.3 mg/dL (ref 0.2–1.2)
Total Protein: 7.2 g/dL (ref 6.1–8.1)

## 2019-11-13 LAB — CBC
HCT: 40.4 % (ref 38.5–50.0)
Hemoglobin: 13.3 g/dL (ref 13.2–17.1)
MCH: 29.2 pg (ref 27.0–33.0)
MCHC: 32.9 g/dL (ref 32.0–36.0)
MCV: 88.6 fL (ref 80.0–100.0)
MPV: 11.6 fL (ref 7.5–12.5)
Platelets: 253 10*3/uL (ref 140–400)
RBC: 4.56 10*6/uL (ref 4.20–5.80)
RDW: 14.2 % (ref 11.0–15.0)
WBC: 5.6 10*3/uL (ref 3.8–10.8)

## 2019-11-13 LAB — HEMOGLOBIN A1C
Hgb A1c MFr Bld: 5.4 % of total Hgb (ref <5.7)
Mean Plasma Glucose: 108 (calc)
eAG (mmol/L): 6 (calc)

## 2019-11-13 MED ORDER — ATORVASTATIN CALCIUM 40 MG PO TABS
40.0000 mg | ORAL_TABLET | Freq: Every day | ORAL | 3 refills | Status: DC
Start: 2019-11-13 — End: 2020-11-16

## 2019-12-12 ENCOUNTER — Emergency Department (HOSPITAL_COMMUNITY)
Admission: EM | Admit: 2019-12-12 | Discharge: 2019-12-12 | Disposition: A | Discharge status: Home / Self Care | Payer: Medicare Other | Attending: Emergency Medicine | Admitting: Emergency Medicine

## 2019-12-12 ENCOUNTER — Encounter (HOSPITAL_COMMUNITY): Payer: Self-pay | Admitting: *Deleted

## 2019-12-12 DIAGNOSIS — T63461A Toxic effect of venom of wasps, accidental (unintentional), initial encounter: Secondary | ICD-10-CM | POA: Diagnosis not present

## 2019-12-12 DIAGNOSIS — F17210 Nicotine dependence, cigarettes, uncomplicated: Secondary | ICD-10-CM | POA: Insufficient documentation

## 2019-12-12 DIAGNOSIS — I10 Essential (primary) hypertension: Secondary | ICD-10-CM | POA: Insufficient documentation

## 2019-12-12 DIAGNOSIS — Z79899 Other long term (current) drug therapy: Secondary | ICD-10-CM | POA: Diagnosis not present

## 2019-12-12 MED ORDER — ACETAMINOPHEN 325 MG PO TABS
650.0000 mg | ORAL_TABLET | Freq: Once | ORAL | Status: AC
  Administered 2019-12-12: 650 mg via ORAL
  Filled 2019-12-12: qty 2

## 2019-12-12 NOTE — Discharge Instructions (Addendum)
Please take Tylenol (acetaminophen) to relieve your pain.  You may take tylenol, up to 1,000 mg (two extra strength pills).  Do not take more than 3,000 mg tylenol in a 24 hour period.  Please check all medication labels as many medications such as pain and cold medications may contain tylenol. Please do not drink alcohol while taking this medication.  ° °

## 2019-12-12 NOTE — ED Triage Notes (Signed)
Stung left ear and left side of neck and left pinky finger, also has stings to right hand and left ankle around 1430 today

## 2019-12-12 NOTE — ED Notes (Signed)
Pt alert and oriented. Comes in after being stung multiple times by yellow jackets while mowing. NAD.

## 2019-12-12 NOTE — ED Provider Notes (Signed)
[facility] Provider Note   CSN: [id number] Arrival date & time: 12/12/19  1828     History Chief Complaint  Patient presents with  . Insect Bite    Craig Shaffer is a 59 y.o. male with a past medical history of hypertension, GERD, anemia, who presents today for evaluation after an insect sting.  He reports that at about 230 today he was mowing the yard when he got stung by bees or wasps on his right hand, left hand, left ankle, left ear and right side of his head.  He denies any shortness of breath.  He cleaned the wounds with alcohol.  He denies any throat swelling or facial swelling.  No nausea vomiting or diarrhea.  No rash or itchiness.  He has not taken any Tylenol, ibuprofen or other medications.  His primary concern is that the areas feel like they are burning.  HPI     Past Medical History:  Diagnosis Date  . Anemia 2012 MCV 76.2 HB 12.3 CR 1.0-1.24   FERRITIN 14   . Demyelinating disease of central nervous system ([place])   . ED (erectile dysfunction)   . Elevated PSA   . GERD (gastroesophageal reflux disease)   . Hypertension   . Nicotine addiction     Patient Active Problem List   Diagnosis Date Noted  . Leg wound, left 11/11/2019  . Tobacco abuse 05/15/2010  . Essential hypertension 12/27/2007    Past Surgical History:  Procedure Laterality Date  . APPENDECTOMY  04/2016  . CHOLECYSTECTOMY  5/07  . COLONOSCOPY  2002 DR [facility] POLYP  . COLONOSCOPY  JAN 2013 [facility] ANEMIA   H POLYP, IH  . GIVENS CAPSULE STUDY  10/05/2011   Procedure: GIVENS CAPSULE STUDY;  Surgeon: Danie Binder, MD;  Location[facility];  Service: Endoscopy;  Laterality: N/A;  . LAPAROSCOPIC APPENDECTOMY N/A 05/11/2016   Procedure: APPENDECTOMY LAPAROSCOPIC;  Surgeon: Aviva Signs, MD;  Location: [facility] ORS;  Service: General;  Laterality: N/A;  . UPPER GASTROINTESTINAL ENDOSCOPY  JAN 2013 ANEMIA   GASTRITIS, NL DUO Bx       Family History  Problem Relation  Age of Onset  . Hypertension Mother   . Cancer Mother 83       metastatized to liver, died at age 77  . Hypertension Sister   . Hypertension Brother   . Hypertension Sister   . Hypertension Sister   . Stroke Sister   . Hypertension Sister   . Heart disease Sister 71       stents in 4 valves, ruptd aorta  . Hypertension Brother   . Colon cancer Neg Hx     Social History   Tobacco Use  . Smoking status: Current Every Day Smoker    Packs/day: 0.25    Years: 20.00    Pack years: 5.00    Types: Cigarettes  . Smokeless tobacco: Never Used  . Tobacco comment:    Vaping Use  . Vaping Use: Never used  Substance Use Topics  . Alcohol use: No    Alcohol/week: 0.0 standard drinks  . Drug use: No    Home Medications Prior to Admission medications   Medication Sig Start Date End Date Taking? Authorizing Provider  atorvastatin (LIPITOR) 40 MG tablet Take 1 tablet (40 mg total) by mouth daily. 11/13/19   Pahwani, Michell Heinrich, MD  cetirizine (ZYRTEC) 10 MG tablet Take 1 tablet (10 mg total) by mouth daily. 09/24/19  Maryruth Hancock, MD  diltiazem (TIAZAC) 360 MG 24 hr capsule TAKE 1 CAPSULE BY MOUTH EVERY DAY 10/03/19   Fayrene Helper, MD  Olopatadine HCl 0.2 % SOLN Apply 1 drop to eye daily. 09/24/19   Corum, Rex Kras, MD    Allergies    Patient has no known allergies.  Review of Systems   Review of Systems  Respiratory: Negative for cough and shortness of breath.   Cardiovascular: Negative for chest pain.  Gastrointestinal: Negative for abdominal pain, diarrhea, nausea and vomiting.  Musculoskeletal: Negative for back pain.  Skin: Positive for wound. Negative for color change and rash.  Neurological: Negative for weakness and light-headedness.  All other systems reviewed and are negative.   Physical Exam Updated Vital Signs BP (!) 160/87   Pulse 67   Temp 98.5 F (36.9 C)   Resp 18   SpO2 98%   Physical Exam Vitals and nursing note reviewed.  Constitutional:       General: He is not in acute distress.    Appearance: He is well-developed. He is not diaphoretic.  HENT:     Head: Normocephalic and atraumatic.  Eyes:     General: No scleral icterus.       Right eye: No discharge.        Left eye: No discharge.     Conjunctiva/sclera: Conjunctivae normal.  Cardiovascular:     Rate and Rhythm: Normal rate and regular rhythm.     Pulses: Normal pulses.     Heart sounds: No murmur heard.   Pulmonary:     Effort: Pulmonary effort is normal. No respiratory distress.     Breath sounds: Normal breath sounds. No stridor.  Abdominal:     General: There is no distension.  Musculoskeletal:        General: No deformity.     Cervical back: Normal range of motion.  Skin:    General: Skin is warm and dry.     Comments: Mild erythema around the left small finger, right index finger.  No stingers visualized in wounds.  No obvious dermal abnormalities where other stings are reported.  Neurological:     Mental Status: He is alert.     Motor: No abnormal muscle tone.  Psychiatric:        Behavior: Behavior normal.     ED Results / Procedures / Treatments   Labs (all labs ordered are listed, but only abnormal results are displayed) Labs Reviewed - No data to display  EKG None  Radiology No results found.  Procedures Procedures (including critical care time)  Medications Ordered in ED Medications  acetaminophen (TYLENOL) tablet 650 mg (has no administration in time range)    ED Course  I have reviewed the triage vital signs and the nursing notes.  Pertinent labs & imaging results that were available during my care of the patient were reviewed by me and considered in my medical decision making (see chart for details).    MDM Rules/Calculators/A&P                         Patient is a 59 year old man who presents today for evaluation after he was stung by bees/wasps.  This happened at about 2:30 PM.  At the time of my evaluation it is nearly 8  PM, and he has not had any systemic reaction, has not taken any Benadryl or other meds prior to arrival, does not have a history of  anaphylaxis to bee/wasp stings and is overall well-appearing with his primary concern being the burning.  Recommended ice, Tylenol.  Return precautions were discussed with patient who states their understanding.  At the time of discharge patient denied any unaddressed complaints or concerns.  Patient is agreeable for discharge home.  Note: Portions of this report may have been transcribed using voice recognition software. Every effort was made to ensure accuracy; however, inadvertent computerized transcription errors may be present   Final Clinical Impression(s) / ED Diagnoses Final diagnoses:  Wasp sting, accidental or unintentional, initial encounter    Rx / DC Orders ED Discharge Orders    None       Ollen Gross 12/12/19 2219    Varney Biles, MD 12/13/19 0004

## 2019-12-17 ENCOUNTER — Telehealth: Payer: Self-pay | Admitting: Urology

## 2019-12-17 NOTE — Telephone Encounter (Signed)
Pt is asking if you will write for revatio instead of sildenafil d/t pt insurance will cover revatio instead

## 2019-12-17 NOTE — Telephone Encounter (Signed)
Patient requests nurse return his call regarding a refill on a medication.

## 2019-12-18 NOTE — Telephone Encounter (Signed)
Revatio is for pulmonary hypertension and he doesn't have that diagnosis and we would have to go through a PA process that we would probably not have success with.  I would suggest he use GoodRx and get sildenafil from [place].  It should be fairly inexpensive that way, but if he insists, we could sent revatio 20mg  po qday #30 with refills and see what happens.

## 2019-12-18 NOTE — Telephone Encounter (Signed)
Pt notified of Revatio and wishes to try GoodRx card first. Card left at desk for pt to pick up.

## 2019-12-29 DIAGNOSIS — Z23 Encounter for immunization: Secondary | ICD-10-CM | POA: Diagnosis not present

## 2020-01-01 ENCOUNTER — Other Ambulatory Visit: Payer: Self-pay | Admitting: Family Medicine

## 2020-01-26 DIAGNOSIS — Z23 Encounter for immunization: Secondary | ICD-10-CM | POA: Diagnosis not present

## 2020-02-11 ENCOUNTER — Ambulatory Visit (INDEPENDENT_AMBULATORY_CARE_PROVIDER_SITE_OTHER): Payer: Medicare Other | Admitting: Family Medicine

## 2020-02-11 ENCOUNTER — Encounter: Payer: Self-pay | Admitting: Family Medicine

## 2020-02-11 ENCOUNTER — Other Ambulatory Visit: Payer: Self-pay

## 2020-02-11 VITALS — BP 130/72 | HR 63 | Resp 16 | Ht 69.0 in | Wt 164.0 lb

## 2020-02-11 DIAGNOSIS — Z720 Tobacco use: Secondary | ICD-10-CM

## 2020-02-11 DIAGNOSIS — Z0000 Encounter for general adult medical examination without abnormal findings: Secondary | ICD-10-CM

## 2020-02-11 DIAGNOSIS — F17210 Nicotine dependence, cigarettes, uncomplicated: Secondary | ICD-10-CM

## 2020-02-11 NOTE — Progress Notes (Signed)
     Craig Shaffer     MRN: [id number]      DOB: [birth year]   HPI: Patient is in for annual physical exam. No other health concerns are expressed or addressed at the visit. Recent labs, if available are reviewed. Immunization is reviewed , and  Declines flu vaccine due to adverse effects in the past  PE; BP 130/72   Pulse 63   Resp 16   Ht 5\' 9"  (1.753 m)   Wt 164 lb 0.6 oz (74.4 kg)   SpO2 98%   BMI 24.22 kg/m   Pleasant male, alert and oriented x 3, in no cardio-pulmonary distress. Afebrile. HEENT No facial trauma or asymetry. Sinuses non tender. EOMI External ears normal,  Neck: supple, no adenopathy,JVD or thyromegaly.No bruits.  Chest: Clear to ascultation bilaterally.No crackles or wheezes. Non tender to palpation  Cardiovascular system; Heart sounds normal,  S1 and  S2 ,no S3.  No murmur, or thrill. Apical beat not displaced Peripheral pulses normal.  Abdomen: Soft, non tender, no organomegaly or masses. No bruits. Bowel sounds normal. No guarding, tenderness or rebound.    Musculoskeletal exam: Full ROM of spine, hips , shoulders and knees. No deformity ,swelling or crepitus noted. No muscle wasting or atrophy.   Neurologic: Cranial nerves 2 to 12 intact. Power, tone ,sensation and reflexes normal throughout.  disturbance in gait. No tremor.  Skin: Intact, no ulceration, erythema , scaling or rash noted. Pigmentation normal throughout  Psych; Normal mood and affect. Judgement and concentration normal   Assessment & Plan:  Annual physical exam Annual exam as documented. Counseling done  re healthy lifestyle involving commitment to 150 minutes exercise per week, heart healthy diet, and attaining healthy weight.The importance of adequate sleep also discussed. Regular seat belt use and home safety, is also discussed. Changes in health habits are decided on by the patient with goals and time frames  set for achieving them. Immunization and  cancer screening needs are specifically addressed at this visit.   Tobacco abuse Asked:confirms currently smokes cigarettes Assess: Unwilling to set a quit date, but is cutting back Advise: needs to QUIT to reduce risk of cancer, cardio and cerebrovascular disease Assist: counseled for 5 minutes and literature provided Arrange: follow up in 2 to 4 months

## 2020-02-11 NOTE — Patient Instructions (Signed)
F/U in office with mD in 6 months, call if you need me sooner  Please get zostrix vaccines ( 2 ) for shingles through your Pharmacy  Please get TdAP through your pharmacy  Please try to set a quit date for smoking and keep trying to quit from 3 cigarettes/ day to ZERO   It is important that you exercise regularly at least 30 minutes 5 times a week. If you develop chest pain, have severe difficulty breathing, or feel very tired, stop exercising immediately and seek medical attention  Thanks for choosing [facility], we consider it a privelige to serve you.

## 2020-02-13 ENCOUNTER — Encounter: Payer: Self-pay | Admitting: Family Medicine

## 2020-02-13 NOTE — Assessment & Plan Note (Signed)

## 2020-02-13 NOTE — Assessment & Plan Note (Signed)
Asked:confirms currently smokes cigarettes °Assess: Unwilling to set a quit date, but is cutting back °Advise: needs to QUIT to reduce risk of cancer, cardio and cerebrovascular disease °Assist: counseled for 5 minutes and literature provided °Arrange: follow up in 2 to 4 months ° °

## 2020-03-31 ENCOUNTER — Other Ambulatory Visit: Payer: Self-pay | Admitting: Family Medicine

## 2020-04-12 ENCOUNTER — Other Ambulatory Visit: Payer: Self-pay

## 2020-04-12 DIAGNOSIS — R972 Elevated prostate specific antigen [PSA]: Secondary | ICD-10-CM

## 2020-04-13 ENCOUNTER — Other Ambulatory Visit: Payer: Medicare Other

## 2020-04-13 ENCOUNTER — Other Ambulatory Visit: Payer: Self-pay

## 2020-04-13 DIAGNOSIS — R972 Elevated prostate specific antigen [PSA]: Secondary | ICD-10-CM

## 2020-04-14 LAB — PSA, TOTAL AND FREE
PSA, Free Pct: 29.7 %
PSA, Free: 0.89 ng/mL
Prostate Specific Ag, Serum: 3 ng/mL (ref 0.0–4.0)

## 2020-04-16 ENCOUNTER — Ambulatory Visit: Payer: Medicare Other | Admitting: Urology

## 2020-04-19 NOTE — Progress Notes (Signed)
Subjective:  1. Elevated PSA   2. Nodular prostate with lower urinary tract symptoms   3. Nocturia   4. Erectile dysfunction due to arterial insufficiency      My PSA is elevated above the normal range.    He has not been on antibiotics for prostate infections previously. He has had a prostate biopsy done. His first prostate biopsy was done approximately 02/10/2013 and it was negative.  04/20/20: Craig Shaffer returns today in f/u for his history of an elevated PSA and prostate nodule.  His PSA prior to this visit is stable at 3.0 with a 29% f/t ratio on 04/13/20.  He is voiding well with an IPSS of 3 and nocturia x 2.   His erectile dysfunction has improved and he no longer requires therapy.       IPSS    Row Name 04/20/20 1000         International Prostate Symptom Score   How often have you had the sensation of not emptying your bladder? Not at All     How often have you had to urinate less than every two hours? Less than 1 in 5 times     How often have you found you stopped and started again several times when you urinated? Not at All     How often have you found it difficult to postpone urination? Not at All     How often have you had a weak urinary stream? Not at All     How often have you had to strain to start urination? Not at All     How many times did you typically get up at night to urinate? 2 Times     Total IPSS Score 3       Quality of Life due to urinary symptoms   If you were to spend the rest of your life with your urinary condition just the way it is now how would you feel about that? Delighted             ROS:  ROS:  A complete review of systems was performed.  All systems are negative except for pertinent findings as noted.   ROS  No Known Allergies  Outpatient Encounter Medications as of 04/20/2020  Medication Sig   atorvastatin (LIPITOR) 40 MG tablet Take 1 tablet (40 mg total) by mouth daily.   diltiazem (TIAZAC) 360 MG 24 hr capsule TAKE 1 CAPSULE  BY MOUTH EVERY DAY   No facility-administered encounter medications on file as of 04/20/2020.    Past Medical History:  Diagnosis Date   Anemia 2012 MCV 76.2 HB 12.3 CR 1.0-1.24   FERRITIN 14    Demyelinating disease of central nervous system [facility])    ED (erectile dysfunction)    Elevated PSA    GERD (gastroesophageal reflux disease)    Hypertension    Nicotine addiction     Past Surgical History:  Procedure Laterality Date   APPENDECTOMY  04/2016   CHOLECYSTECTOMY  5/07   COLONOSCOPY  2002 DR SMITH   [place] POLYP   COLONOSCOPY  JAN 2013 [facility] ANEMIA   H POLYP, IH   GIVENS CAPSULE STUDY  10/05/2011   Procedure: GIVENS CAPSULE STUDY;  Surgeon: Danie Binder, MD;  Location: [facility] ENDO SUITE;  Service: Endoscopy;  Laterality: N/A;   LAPAROSCOPIC APPENDECTOMY N/A 05/11/2016   Procedure: APPENDECTOMY LAPAROSCOPIC;  Surgeon: Aviva Signs, MD;  Location: [facility] ORS;  Service: General;  Laterality: N/A;   UPPER GASTROINTESTINAL  ENDOSCOPY  JAN 2013 ANEMIA   GASTRITIS, NL DUO Bx    Social History   Socioeconomic History   Marital status: Single    Spouse name: Not on file   Number of children: 0   Years of education: 59   Highest education level: 12th grade  Occupational History    Employer: UNEMPLOYED  Tobacco Use   Smoking status: Current Every Day Smoker    Packs/day: 0.25    Years: 20.00    Pack years: 5.00    Types: Cigarettes   Smokeless tobacco: Never Used   Tobacco comment:    Vaping Use   Vaping Use: Never used  Substance and Sexual Activity   Alcohol use: No    Alcohol/week: 0.0 standard drinks   Drug use: No   Sexual activity: Yes  Other Topics Concern   Not on file  Social History Narrative   Lives with sister and aunt. Aunt passed away two weeks ago    Social Determinants of Radio broadcast assistant Strain:    Difficulty of Paying Living Expenses: Not on file  Food Insecurity:    Worried About Charity fundraiser in the Last  Year: Not on file   [employer] of Food in the Last Year: Not on file  Transportation Needs:    Lack of Transportation (Medical): Not on file   Lack of Transportation (Non-Medical): Not on file  Physical Activity:    Days of Exercise per Week: Not on file   Minutes of Exercise per Session: Not on file  Stress:    Feeling of Stress : Not on file  Social Connections:    Frequency of Communication with Friends and Family: Not on file   Frequency of Social Gatherings with Friends and Family: Not on file   Attends Religious Services: Not on file   Active Member of Clubs or Organizations: Not on file   Attends Archivist Meetings: Not on file   Marital Status: Not on file  Intimate Partner Violence:    Fear of Current or Ex-Partner: Not on file   Emotionally Abused: Not on file   Physically Abused: Not on file   Sexually Abused: Not on file    Family History  Problem Relation Age of Onset   Hypertension Mother    Cancer Mother 55       metastatized to liver, died at age 39   Hypertension Sister    Hypertension Brother    Hypertension Sister    Hypertension Sister    Stroke Sister    Hypertension Sister    Heart disease Sister 2       stents in 4 valves, ruptd aorta   Hypertension Brother    Colon cancer Neg Hx        Objective: Vitals:   04/20/20 0950  BP: (!) 150/78  Pulse: 69  Temp: 98.5 F (36.9 C)     Physical Exam Vitals reviewed.  Constitutional:      Appearance: Normal appearance.  Genitourinary:    Comments: AP moderate external hemorrhoids. NST without mass. Prostate 1.5+ benign.  SV non-palpable.  Neurological:     Mental Status: He is alert.     Lab Results:  Recent Results (from the past 2160 hour(s))  PSA, total and free     Status: None   Collection Time: 04/13/20  8:57 AM  Result Value Ref Range   Prostate Specific Ag, Serum 3.0 0.0 - 4.0 ng/mL  Comment: Roche ECLIA methodology. According to the  American Urological Association, Serum PSA should decrease and remain at undetectable levels after radical prostatectomy. The AUA defines biochemical recurrence as an initial PSA value 0.2 ng/mL or greater followed by a subsequent confirmatory PSA value 0.2 ng/mL or greater. Values obtained with different assay methods or kits cannot be used interchangeably. Results cannot be interpreted as absolute evidence of the presence or absence of malignant disease.    PSA, Free 0.89 N/A ng/mL    Comment: Roche ECLIA methodology.   PSA, Free Pct 29.7 %    Comment: The table below lists the probability of prostate cancer for men with non-suspicious DRE results and total PSA between 4 and 10 ng/mL, by patient age Ricci Barker, [place], [id number]).                   % Free PSA       50-64 yr        65-75 yr                   0.00-10.00%        56%             55%                  10.01-15.00%        24%             35%                  15.01-20.00%        17%             23%                  20.01-25.00%        10%             20%                       >25.00%         5%              9% Please note:  Catalona et al did not make specific               recommendations regarding the use of               percent free PSA for any other population               of men.      PSA PSA  Date Value Ref Range Status  11/19/2018 2.5 < OR = 4.0 ng/mL Final    Comment:    The total PSA value from this assay system is  standardized against the WHO standard. The test  result will be approximately 20% lower when compared  to the equimolar-standardized total PSA (Beckman  Coulter). Comparison of serial PSA results should be  interpreted with this fact in mind. . This test was performed using the Siemens  chemiluminescent method. Values obtained from  different assay methods cannot be used interchangeably. PSA levels, regardless of value, should not be interpreted as absolute evidence of the  presence or absence of disease.   09/30/2014 3.49 <=4.00 ng/mL Final    Comment:    Test Methodology: ECLIA PSA (Electrochemiluminescence Immunoassay)   For PSA values from 2.5-4.0, particularly in younger men <29 years old, the AUA and NCCN suggest testing  for % Free PSA (3515) and evaluation of the rate of increase in PSA (PSA velocity).   08/30/2012 2.60 <=4.00 ng/mL Final    Comment:    Test Methodology: ECLIA PSA (Electrochemiluminescence Immunoassay)   For PSA values from 2.5-4.0, particularly in younger men <66 years old, the AUA and NCCN suggest testing for % Free PSA (3515) and evaluation of the rate of increase in PSA (PSA velocity).   No results found for: TESTOSTERONE    Studies/Results: No results found.  Past records reviewed.   Assessment & Plan:  History of prostate nodule with an elevated PSA.  His PSA is stable today and his exam is unremarkable.  He will return in 1 year with a PSA.  ED.  He not requiring treatment at this time.  No orders of the defined types were placed in this encounter.    Orders Placed This Encounter  Procedures   Urinalysis, Routine w reflex microscopic   PSA, total and free    Standing Status:   Future    Standing Expiration Date:   04/20/2021      Return in about 1 year (around 04/20/2021) for with PSA.   CC: Fayrene Helper, MD      Irine Seal 04/20/2020

## 2020-04-20 ENCOUNTER — Ambulatory Visit (INDEPENDENT_AMBULATORY_CARE_PROVIDER_SITE_OTHER): Payer: Medicare Other | Admitting: Urology

## 2020-04-20 ENCOUNTER — Encounter: Payer: Self-pay | Admitting: Urology

## 2020-04-20 ENCOUNTER — Other Ambulatory Visit: Payer: Self-pay

## 2020-04-20 VITALS — BP 150/78 | HR 69 | Temp 98.5°F | Ht 69.0 in | Wt 160.0 lb

## 2020-04-20 DIAGNOSIS — N403 Nodular prostate with lower urinary tract symptoms: Secondary | ICD-10-CM

## 2020-04-20 DIAGNOSIS — R972 Elevated prostate specific antigen [PSA]: Secondary | ICD-10-CM | POA: Diagnosis not present

## 2020-04-20 DIAGNOSIS — N5201 Erectile dysfunction due to arterial insufficiency: Secondary | ICD-10-CM

## 2020-04-20 DIAGNOSIS — R351 Nocturia: Secondary | ICD-10-CM | POA: Diagnosis not present

## 2020-04-20 LAB — URINALYSIS, ROUTINE W REFLEX MICROSCOPIC
Bilirubin, UA: NEGATIVE
Glucose, UA: NEGATIVE
Leukocytes,UA: NEGATIVE
Nitrite, UA: NEGATIVE
RBC, UA: NEGATIVE
Specific Gravity, UA: 1.025 (ref 1.005–1.030)
Urobilinogen, Ur: 0.2 mg/dL (ref 0.2–1.0)
pH, UA: 6.5 (ref 5.0–7.5)

## 2020-04-20 LAB — MICROSCOPIC EXAMINATION
Bacteria, UA: NONE SEEN
Epithelial Cells (non renal): NONE SEEN /hpf (ref 0–10)
Renal Epithel, UA: NONE SEEN /hpf

## 2020-04-20 NOTE — Progress Notes (Signed)
Urological Symptom Review  Patient is experiencing the following symptoms: Get up at night to urinate Stream starts and stops   Review of Systems  Gastrointestinal (upper)  : Negative for upper GI symptoms  Gastrointestinal (lower) : Negative for lower GI symptoms  Constitutional : Negative for symptoms  Skin: Negative for skin symptoms  Eyes: Negative for eye symptoms  Ear/Nose/Throat : Negative for Ear/Nose/Throat symptoms  Hematologic/Lymphatic: Negative for Hematologic/Lymphatic symptoms  Cardiovascular : Negative for cardiovascular symptoms  Respiratory : Negative for respiratory symptoms  Endocrine: Negative for endocrine symptoms  Musculoskeletal: Negative for musculoskeletal symptoms  Neurological: Negative for neurological symptoms  Psychologic: Negative for psychiatric symptoms 

## 2020-06-29 ENCOUNTER — Other Ambulatory Visit: Payer: Self-pay | Admitting: Family Medicine

## 2020-08-11 ENCOUNTER — Encounter: Payer: Self-pay | Admitting: Family Medicine

## 2020-08-11 ENCOUNTER — Telehealth (INDEPENDENT_AMBULATORY_CARE_PROVIDER_SITE_OTHER): Payer: Medicare Other | Admitting: Family Medicine

## 2020-08-11 ENCOUNTER — Other Ambulatory Visit: Payer: Self-pay

## 2020-08-11 VITALS — Ht 69.0 in | Wt 162.0 lb

## 2020-08-11 DIAGNOSIS — F17210 Nicotine dependence, cigarettes, uncomplicated: Secondary | ICD-10-CM | POA: Diagnosis not present

## 2020-08-11 DIAGNOSIS — Z1321 Encounter for screening for nutritional disorder: Secondary | ICD-10-CM

## 2020-08-11 DIAGNOSIS — I10 Essential (primary) hypertension: Secondary | ICD-10-CM | POA: Diagnosis not present

## 2020-08-11 DIAGNOSIS — Z13220 Encounter for screening for lipoid disorders: Secondary | ICD-10-CM | POA: Diagnosis not present

## 2020-08-11 DIAGNOSIS — R7302 Impaired glucose tolerance (oral): Secondary | ICD-10-CM

## 2020-08-11 DIAGNOSIS — Z1329 Encounter for screening for other suspected endocrine disorder: Secondary | ICD-10-CM | POA: Diagnosis not present

## 2020-08-11 DIAGNOSIS — Z720 Tobacco use: Secondary | ICD-10-CM

## 2020-08-11 NOTE — Patient Instructions (Addendum)
Annual wellness to be scheduled  Annual physical exam in office in [place], when due  Please get fasting lipid, cmp and EGFr, TSH and vit D tomorrow, or this week , as discussed  Covid Booster past due, please get this, this week  Get shingrix #1 and # 2 at  your pharmacy starting in next 2 to 3 weeks, around March 22  Get TdAP at your pharmacy in April  You DO sTILL NEED to syop smoking to improve your health, and reduce risk of stroke, heart disease and cancer. Pleas work on stopping.      Steps to Quit Smoking Smoking tobacco is the leading cause of preventable death. It can affect almost every organ in the body. Smoking puts you and people around you at risk for many serious, long-lasting (chronic) diseases. Quitting smoking can be hard, but it is one of the best things that you can do for your health. It is never too late to quit. How do I get ready to quit? When you decide to quit smoking, make a plan to help you succeed. Before you quit:  Pick a date to quit. Set a date within the next 2 weeks to give you time to prepare.  Write down the reasons why you are quitting. Keep this list in places where you will see it often.  Tell your family, friends, and co-workers that you are quitting. Their support is important.  Talk with your doctor about the choices that may help you quit.  Find out if your health insurance will pay for these treatments.  Know the people, places, things, and activities that make you want to smoke (triggers). Avoid them. What first steps can I take to quit smoking?  Throw away all cigarettes at home, at work, and in your car.  Throw away the things that you use when you smoke, such as ashtrays and lighters.  Clean your car. Make sure to empty the ashtray.  Clean your home, including curtains and carpets. What can I do to help me quit smoking? Talk with your doctor about taking medicines and seeing a counselor at the same time. You are more  likely to succeed when you do both.  If you are pregnant or breastfeeding, talk with your doctor about counseling or other ways to quit smoking. Do not take medicine to help you quit smoking unless your doctor tells you to do so. To quit smoking: Quit right away  Quit smoking totally, instead of slowly cutting back on how much you smoke over a period of time.  Go to counseling. You are more likely to quit if you go to counseling sessions regularly. Take medicine You may take medicines to help you quit. Some medicines need a prescription, and some you can buy over-the-counter. Some medicines may contain a drug called nicotine to replace the nicotine in cigarettes. Medicines may:  Help you to stop having the desire to smoke (cravings).  Help to stop the problems that come when you stop smoking (withdrawal symptoms). Your doctor may ask you to use:  Nicotine patches, gum, or lozenges.  Nicotine inhalers or sprays.  Non-nicotine medicine that is taken by mouth. Find resources Find resources and other ways to help you quit smoking and remain smoke-free after you quit. These resources are most helpful when you use them often. They include:  Online chats with a Social worker.  Phone quitlines.  Printed Furniture conservator/restorer.  Support groups or group counseling.  Text messaging programs.  Mobile phone  apps. Use apps on your mobile phone or tablet that can help you stick to your quit plan. There are many free apps for mobile phones and tablets as well as websites. Examples include Quit Guide from the [employer] and smokefree.gov   What things can I do to make it easier to quit?  Talk to your family and friends. Ask them to support and encourage you.  Call a phone quitline (1-800-QUIT-NOW), reach out to support groups, or work with a Social worker.  Ask people who smoke to not smoke around you.  Avoid places that make you want to smoke, such as: ? Bars. ? Parties. ? Smoke-break areas at  work.  Spend time with people who do not smoke.  Lower the stress in your life. Stress can make you want to smoke. Try these things to help your stress: ? Getting regular exercise. ? Doing deep-breathing exercises. ? Doing yoga. ? Meditating. ? Doing a body scan. To do this, close your eyes, focus on one area of your body at a time from head to toe. Notice which parts of your body are tense. Try to relax the muscles in those areas.   How will I feel when I quit smoking? Day 1 to 3 weeks Within the first 24 hours, you may start to have some problems that come from quitting tobacco. These problems are very bad 2-3 days after you quit, but they do not often last for more than 2-3 weeks. You may get these symptoms:  Mood swings.  Feeling restless, nervous, angry, or annoyed.  Trouble concentrating.  Dizziness.  Strong desire for high-sugar foods and nicotine.  Weight gain.  Trouble pooping (constipation).  Feeling like you may vomit (nausea).  Coughing or a sore throat.  Changes in how the medicines that you take for other issues work in your body.  Depression.  Trouble sleeping (insomnia). Week 3 and afterward After the first 2-3 weeks of quitting, you may start to notice more positive results, such as:  Better sense of smell and taste.  Less coughing and sore throat.  Slower heart rate.  Lower blood pressure.  Clearer skin.  Better breathing.  Fewer sick days. Quitting smoking can be hard. Do not give up if you fail the first time. Some people need to try a few times before they succeed. Do your best to stick to your quit plan, and talk with your doctor if you have any questions or concerns. Summary  Smoking tobacco is the leading cause of preventable death. Quitting smoking can be hard, but it is one of the best things that you can do for your health.  When you decide to quit smoking, make a plan to help you succeed.  Quit smoking right away, not slowly over  a period of time.  When you start quitting, seek help from your doctor, family, or friends. This information is not intended to replace advice given to you by your health care provider. Make sure you discuss any questions you have with your health care provider. Document Revised: 02/21/2019 Document Reviewed: 08/17/2018 Elsevier Patient Education  [place].

## 2020-08-11 NOTE — Progress Notes (Signed)
Virtual Visit via Telephone Note  I connected with Craig Shaffer on 08/11/20 at  9:00 AM EST by telephone and verified that I am speaking with the correct person using two identifiers.  Location: Patient: home Provider: office   I discussed the limitations, risks, security and privacy concerns of performing an evaluation and management service by telephone and the availability of in person appointments. I also discussed with the patient that there may be a patient responsible charge related to this service. The patient expressed understanding and agreed to proceed.   History of Present Illness: F/U chronic problems and address any new or current concerns. Review and update medications and allergies. Review recent lab and radiologic data . Update routine health maintainace. Review an encourage improved health habits to include nutrition, exercise and  sleep .  Denies recent fever or chills. Denies sinus pressure, nasal congestion, ear pain or sore throat. Denies chest congestion, productive cough or wheezing. Denies chest pains, palpitations and leg swelling Denies abdominal pain, nausea, vomiting,diarrhea or constipation.   Denies dysuria, frequency, hesitancy or incontinence. Denies uncontrolled  joint pain, has chronic  limitation in mobility. Denies headaches, seizures, numbness, or tingling. Denies depression, anxiety or insomnia. Denies skin break down or rash.       Observations/Objective: Ht 5\' 9"  (1.753 m)   Wt 162 lb (73.5 kg)   BMI 23.92 kg/m  Good communication with no confusion and intact memory. Alert and oriented x 3 No signs of respiratory distress during speech    Assessment and Plan: Essential hypertension DASH diet and commitment to daily physical activity for a minimum of 30 minutes discussed and encouraged, as a part of hypertension management. The importance of attaining a healthy weight is also discussed.  BP/Weight 08/11/2020 04/20/2020  02/11/2020 12/12/2019 11/11/2019 09/24/2019 8/92/1194  Systolic BP - 174 081 448 185 631 497  Diastolic BP - 78 72 80 76 72 90  Wt. (Lbs) 162 160 164.04 - 167 171.2 164  BMI 23.92 23.63 24.22 - 24.66 25.28 24.22       Tobacco abuse Asked:confirms currently smokes cigarettes Assess: Unwilling to set a quit date, but is cutting back Advise: needs to QUIT to reduce risk of cancer, cardio and cerebrovascular disease Assist: counseled for 5 minutes and literature provided Arrange: follow up in 3months     Follow Up Instructions:    I discussed the assessment and treatment plan with the patient. The patient was provided an opportunity to ask questions and all were answered. The patient agreed with the plan and demonstrated an understanding of the instructions.   The patient was advised to call back or seek an in-person evaluation if the symptoms worsen or if the condition fails to improve as anticipated.  I provided 12 minutes of non-face-to-face time during this encounter.   Tula Nakayama, MD

## 2020-08-11 NOTE — Assessment & Plan Note (Signed)
Asked:confirms currently smokes cigarettes Assess: Unwilling to set a quit date, but is cutting back Advise: needs to QUIT to reduce risk of cancer, cardio and cerebrovascular disease Assist: counseled for 5 minutes and literature provided Arrange: follow up in 19months

## 2020-08-11 NOTE — Assessment & Plan Note (Signed)
DASH diet and commitment to daily physical activity for a minimum of 30 minutes discussed and encouraged, as a part of hypertension management. The importance of attaining a healthy weight is also discussed.  BP/Weight 08/11/2020 04/20/2020 02/11/2020 12/12/2019 11/11/2019 09/24/2019 08/24/9456  Systolic BP - 592 924 462 [phone]  Diastolic BP - 78 72 80 76 72 90  Wt. (Lbs) 162 160 164.04 - 167 171.2 164  BMI 23.92 23.63 24.22 - 24.66 25.28 24.22

## 2020-08-16 DIAGNOSIS — Z13220 Encounter for screening for lipoid disorders: Secondary | ICD-10-CM | POA: Diagnosis not present

## 2020-08-16 DIAGNOSIS — I10 Essential (primary) hypertension: Secondary | ICD-10-CM | POA: Diagnosis not present

## 2020-08-16 DIAGNOSIS — Z1329 Encounter for screening for other suspected endocrine disorder: Secondary | ICD-10-CM | POA: Diagnosis not present

## 2020-08-16 DIAGNOSIS — R7302 Impaired glucose tolerance (oral): Secondary | ICD-10-CM | POA: Diagnosis not present

## 2020-08-16 DIAGNOSIS — E559 Vitamin D deficiency, unspecified: Secondary | ICD-10-CM | POA: Diagnosis not present

## 2020-08-16 DIAGNOSIS — Z1321 Encounter for screening for nutritional disorder: Secondary | ICD-10-CM | POA: Diagnosis not present

## 2020-08-17 ENCOUNTER — Other Ambulatory Visit: Payer: Self-pay | Admitting: Family Medicine

## 2020-08-17 ENCOUNTER — Ambulatory Visit: Payer: Medicare Other

## 2020-08-17 LAB — TSH: TSH: 1.74 u[IU]/mL (ref 0.450–4.500)

## 2020-08-17 LAB — CMP14+EGFR
ALT: 21 IU/L (ref 0–44)
AST: 23 IU/L (ref 0–40)
Albumin/Globulin Ratio: 1.2 (ref 1.2–2.2)
Albumin: 4.1 g/dL (ref 3.8–4.8)
Alkaline Phosphatase: 112 IU/L (ref 44–121)
BUN/Creatinine Ratio: 12 (ref 10–24)
BUN: 14 mg/dL (ref 8–27)
Bilirubin Total: 0.3 mg/dL (ref 0.0–1.2)
CO2: 20 mmol/L (ref 20–29)
Calcium: 9.1 mg/dL (ref 8.6–10.2)
Chloride: 103 mmol/L (ref 96–106)
Creatinine, Ser: 1.18 mg/dL (ref 0.76–1.27)
Globulin, Total: 3.4 g/dL (ref 1.5–4.5)
Glucose: 84 mg/dL (ref 65–99)
Potassium: 4.3 mmol/L (ref 3.5–5.2)
Sodium: 138 mmol/L (ref 134–144)
Total Protein: 7.5 g/dL (ref 6.0–8.5)
eGFR: 70 mL/min/{1.73_m2} (ref >59)

## 2020-08-17 LAB — LIPID PANEL
Chol/HDL Ratio: 2.5 ratio (ref 0.0–5.0)
Cholesterol, Total: 121 mg/dL (ref 100–199)
HDL: 49 mg/dL (ref >39)
LDL Chol Calc (NIH): 57 mg/dL (ref 0–99)
Triglycerides: 73 mg/dL (ref 0–149)
VLDL Cholesterol Cal: 15 mg/dL (ref 5–40)

## 2020-08-17 LAB — VITAMIN D 25 HYDROXY (VIT D DEFICIENCY, FRACTURES): Vit D, 25-Hydroxy: 9.5 ng/mL — ABNORMAL LOW (ref 30.0–100.0)

## 2020-08-17 MED ORDER — ERGOCALCIFEROL 1.25 MG (50000 UT) PO CAPS: 50000.0000 [IU] | ORAL_CAPSULE | ORAL | 2 refills | Status: DC

## 2020-08-18 ENCOUNTER — Telehealth: Payer: Self-pay

## 2020-08-18 NOTE — Telephone Encounter (Signed)
Please call patient regarding his rx.  He said you sent him a discount card and he can not find it.

## 2020-08-18 NOTE — Telephone Encounter (Signed)
Pt aware- sent good rx card to [facility] for vitamin d

## 2020-08-24 ENCOUNTER — Ambulatory Visit (INDEPENDENT_AMBULATORY_CARE_PROVIDER_SITE_OTHER): Payer: Medicare Other

## 2020-08-24 ENCOUNTER — Other Ambulatory Visit: Payer: Self-pay

## 2020-08-24 VITALS — Ht 69.0 in | Wt 161.0 lb

## 2020-08-24 DIAGNOSIS — Z0000 Encounter for general adult medical examination without abnormal findings: Secondary | ICD-10-CM | POA: Diagnosis not present

## 2020-08-24 NOTE — Patient Instructions (Signed)
Craig Shaffer , Thank you for taking time to come for your Medicare Wellness Visit. I appreciate your ongoing commitment to your health goals. Please review the following plan we discussed and let me know if I can assist you in the future.   Screening recommendations/referrals: Colonoscopy: DUE 2023 Recommended yearly ophthalmology/optometry visit for glaucoma screening and checkup Recommended yearly dental visit for hygiene and checkup  Vaccinations: Influenza vaccine: up to date  Pneumococcal vaccine: up to date Tdap vaccine: will get vaccine at the pharmacy Shingles vaccine: getting shot 08/25/20    Conditions/risks identified: smoking  Next appointment: August 25, 2021 8:00AM  Preventive Care 7-64 Years, Male Preventive care refers to lifestyle choices and visits with your health care provider that can promote health and wellness. What does preventive care include?  A yearly physical exam. This is also called an annual well check.  Dental exams once or twice a year.  Routine eye exams. Ask your health care provider how often you should have your eyes checked.  Personal lifestyle choices, including:  Daily care of your teeth and gums.  Regular physical activity.  Eating a healthy diet.  Avoiding tobacco and drug use.  Limiting alcohol use.  Practicing safe sex.  Taking low-dose aspirin every day starting at age 65. What happens during an annual well check? The services and screenings done by your health care provider during your annual well check will depend on your age, overall health, lifestyle risk factors, and family history of disease. Counseling  Your health care provider may ask you questions about your:  Alcohol use.  Tobacco use.  Drug use.  Emotional well-being.  Home and relationship well-being.  Sexual activity.  Eating habits.  Work and work Statistician. Screening  You may have the following tests or measurements:  Height, weight,  and BMI.  Blood pressure.  Lipid and cholesterol levels. These may be checked every 5 years, or more frequently if you are over 47 years old.  Skin check.  Lung cancer screening. You may have this screening every year starting at age 34 if you have a 30-pack-year history of smoking and currently smoke or have quit within the past 15 years.  Fecal occult blood test (FOBT) of the stool. You may have this test every year starting at age 39.  Flexible sigmoidoscopy or colonoscopy. You may have a sigmoidoscopy every 5 years or a colonoscopy every 10 years starting at age 34.  Prostate cancer screening. Recommendations will vary depending on your family history and other risks.  Hepatitis C blood test.  Hepatitis B blood test.  Sexually transmitted disease (STD) testing.  Diabetes screening. This is done by checking your blood sugar (glucose) after you have not eaten for a while (fasting). You may have this done every 1-3 years. Discuss your test results, treatment options, and if necessary, the need for more tests with your health care provider. Vaccines  Your health care provider may recommend certain vaccines, such as:  Influenza vaccine. This is recommended every year.  Tetanus, diphtheria, and acellular pertussis (Tdap, Td) vaccine. You may need a Td booster every 10 years.  Zoster vaccine. You may need this after age 85.  Pneumococcal 13-valent conjugate (PCV13) vaccine. You may need this if you have certain conditions and have not been vaccinated.  Pneumococcal polysaccharide (PPSV23) vaccine. You may need one or two doses if you smoke cigarettes or if you have certain conditions. Talk to your health care provider about which screenings and vaccines you need  and how often you need them. This information is not intended to replace advice given to you by your health care provider. Make sure you discuss any questions you have with your health care provider. Document Released:  06/25/2015 Document Revised: 02/16/2016 Document Reviewed: 03/30/2015 Elsevier Interactive Patient Education  2017 [place] Prevention in the Home Falls can cause injuries. They can happen to people of all ages. There are many things you can do to make your home safe and to help prevent falls. What can I do on the outside of my home?  Regularly fix the edges of walkways and driveways and fix any cracks.  Remove anything that might make you trip as you walk through a door, such as a raised step or threshold.  Trim any bushes or trees on the path to your home.  Use bright outdoor lighting.  Clear any walking paths of anything that might make someone trip, such as rocks or tools.  Regularly check to see if handrails are loose or broken. Make sure that both sides of any steps have handrails.  Any raised decks and porches should have guardrails on the edges.  Have any leaves, snow, or ice cleared regularly.  Use sand or salt on walking paths during winter.  Clean up any spills in your garage right away. This includes oil or grease spills. What can I do in the bathroom?  Use night lights.  Install grab bars by the toilet and in the tub and shower. Do not use towel bars as grab bars.  Use non-skid mats or decals in the tub or shower.  If you need to sit down in the shower, use a plastic, non-slip stool.  Keep the floor dry. Clean up any water that spills on the floor as soon as it happens.  Remove soap buildup in the tub or shower regularly.  Attach bath mats securely with double-sided non-slip rug tape.  Do not have throw rugs and other things on the floor that can make you trip. What can I do in the bedroom?  Use night lights.  Make sure that you have a light by your bed that is easy to reach.  Do not use any sheets or blankets that are too big for your bed. They should not hang down onto the floor.  Have a firm chair that has side arms. You can use this for  support while you get dressed.  Do not have throw rugs and other things on the floor that can make you trip. What can I do in the kitchen?  Clean up any spills right away.  Avoid walking on wet floors.  Keep items that you use a lot in easy-to-reach places.  If you need to reach something above you, use a strong step stool that has a grab bar.  Keep electrical cords out of the way.  Do not use floor polish or wax that makes floors slippery. If you must use wax, use non-skid floor wax.  Do not have throw rugs and other things on the floor that can make you trip. What can I do with my stairs?  Do not leave any items on the stairs.  Make sure that there are handrails on both sides of the stairs and use them. Fix handrails that are broken or loose. Make sure that handrails are as long as the stairways.  Check any carpeting to make sure that it is firmly attached to the stairs. Fix any carpet that is  loose or worn.  Avoid having throw rugs at the top or bottom of the stairs. If you do have throw rugs, attach them to the floor with carpet tape.  Make sure that you have a light switch at the top of the stairs and the bottom of the stairs. If you do not have them, ask someone to add them for you. What else can I do to help prevent falls?  Wear shoes that:  Do not have high heels.  Have rubber bottoms.  Are comfortable and fit you well.  Are closed at the toe. Do not wear sandals.  If you use a stepladder:  Make sure that it is fully opened. Do not climb a closed stepladder.  Make sure that both sides of the stepladder are locked into place.  Ask someone to hold it for you, if possible.  Clearly mark and make sure that you can see:  Any grab bars or handrails.  First and last steps.  Where the edge of each step is.  Use tools that help you move around (mobility aids) if they are needed. These include:  Canes.  Walkers.  Scooters.  Crutches.  Turn on the  lights when you go into a dark area. Replace any light bulbs as soon as they burn out.  Set up your furniture so you have a clear path. Avoid moving your furniture around.  If any of your floors are uneven, fix them.  If there are any pets around you, be aware of where they are.  Review your medicines with your doctor. Some medicines can make you feel dizzy. This can increase your chance of falling. Ask your doctor what other things that you can do to help prevent falls. This information is not intended to replace advice given to you by your health care provider. Make sure you discuss any questions you have with your health care provider. Document Released: 03/25/2009 Document Revised: 11/04/2015 Document Reviewed: 07/03/2014 Elsevier Interactive Patient Education  2017 [employer].

## 2020-08-24 NOTE — Progress Notes (Signed)
Subjective:   Craig Shaffer is a 60 y.o. male who presents for Medicare Annual/Subsequent preventive examination.  Review of Systems     Cardiac Risk Factors include: hypertension;male gender;smoking/ tobacco exposure     Objective:    Today's Vitals   08/24/20 0805 08/24/20 0806  Weight: 161 lb (73 kg)   Height: 5\' 9"  (1.753 m)   PainSc: 0-No pain 0-No pain   Body mass index is 23.78 kg/m.  Advanced Directives 06/17/2018 07/18/2017 06/22/2016 05/11/2016 05/11/2016 05/11/2016 05/10/2016  Does Patient Have a Medical Advance Directive? No No No - Yes No No  Does patient want to make changes to medical advance directive? - - No - Patient declined - - - -  Would patient like information on creating a medical advance directive? No - Patient declined No - Patient declined - No - Patient declined - No - Patient declined -    Current Medications (verified) Outpatient Encounter Medications as of 08/24/2020  Medication Sig  . atorvastatin (LIPITOR) 40 MG tablet Take 1 tablet (40 mg total) by mouth daily.  Marland Kitchen diltiazem (TIAZAC) 360 MG 24 hr capsule TAKE 1 CAPSULE BY MOUTH EVERY DAY  . ergocalciferol (VITAMIN D2) 1.25 MG (50000 UT) capsule Take 1 capsule (50,000 Units total) by mouth once a week. One capsule once weekly   No facility-administered encounter medications on file as of 08/24/2020.    Allergies (verified) Patient has no known allergies.   History: Past Medical History:  Diagnosis Date  . Anemia 2012 MCV 76.2 HB 12.3 CR 1.0-1.24   FERRITIN 14   . Demyelinating disease of central nervous system ([place])   . ED (erectile dysfunction)   . Elevated PSA   . GERD (gastroesophageal reflux disease)   . Hypertension   . Nicotine addiction    Past Surgical History:  Procedure Laterality Date  . APPENDECTOMY  04/2016  . CHOLECYSTECTOMY  5/07  . COLONOSCOPY  2002 [facility] POLYP  . COLONOSCOPY  JAN 2013 [facility] ANEMIA   H POLYP, IH  . GIVENS CAPSULE STUDY  10/05/2011    Procedure: GIVENS CAPSULE STUDY;  Surgeon: Danie Binder, MD;  Location: [facility];  Service: Endoscopy;  Laterality: N/A;  . LAPAROSCOPIC APPENDECTOMY N/A 05/11/2016   Procedure: APPENDECTOMY LAPAROSCOPIC;  Surgeon: Aviva Signs, MD;  Location: [facility] ORS;  Service: General;  Laterality: N/A;  . UPPER GASTROINTESTINAL ENDOSCOPY  JAN 2013 ANEMIA   GASTRITIS, NL DUO Bx   Family History  Problem Relation Age of Onset  . Hypertension Mother   . Cancer Mother 70       metastatized to liver, died at age 38  . Hypertension Sister   . Hypertension Brother   . Hypertension Sister   . Hypertension Sister   . Stroke Sister   . Hypertension Sister   . Heart disease Sister 71       stents in 4 valves, ruptd aorta  . Hypertension Brother   . Colon cancer Neg Hx    Social History   Socioeconomic History  . Marital status: Single    Spouse name: Not on file  . Number of children: 0  . Years of education: 23  . Highest education level: 12th grade  Occupational History    Employer: UNEMPLOYED  Tobacco Use  . Smoking status: Current Every Day Smoker    Packs/day: 0.25    Years: 20.00    Pack years: 5.00    Types: Cigarettes  . Smokeless  tobacco: Never Used  . Tobacco comment:    Vaping Use  . Vaping Use: Never used  Substance and Sexual Activity  . Alcohol use: No    Alcohol/week: 0.0 standard drinks  . Drug use: No  . Sexual activity: Yes  Other Topics Concern  . Not on file  Social History Narrative   Lives with sister and aunt. Aunt passed away two weeks ago    Social Determinants of Health   Financial Resource Strain: Low Risk   . Difficulty of Paying Living Expenses: Not hard at all  Food Insecurity: No Food Insecurity  . Worried About Charity fundraiser in the Last Year: Never true  . Ran Out of Food in the Last Year: Never true  Transportation Needs: No Transportation Needs  . Lack of Transportation (Medical): No  . Lack of Transportation (Non-Medical): No   Physical Activity: Sufficiently Active  . Days of Exercise per Week: 7 days  . Minutes of Exercise per Session: 60 min  Stress: Not on file  Social Connections: Moderately Isolated  . Frequency of Communication with Friends and Family: More than three times a week  . Frequency of Social Gatherings with Friends and Family: More than three times a week  . Attends Religious Services: More than 4 times per year  . Active Member of Clubs or Organizations: No  . Attends Archivist Meetings: Never  . Marital Status: Never married    Tobacco Counseling Ready to quit: Not Answered Counseling given: Not Answered Comment:     Clinical Intake:  Pre-visit preparation completed: Yes  Pain : No/denies pain Pain Score: 0-No pain     Nutritional Status: BMI of 19-24  Normal Diabetes: No  How often do you need to have someone help you when you read instructions, pamphlets, or other written materials from your doctor or pharmacy?: 1 - Never What is the last grade level you completed in school?: 12th grade  Diabetic? no  Interpreter Needed?: No  Information entered by :: brandihudy LPN   Activities of Daily Living In your present state of health, do you have any difficulty performing the following activities: 08/24/2020  Hearing? N  Vision? N  Difficulty concentrating or making decisions? N  Walking or climbing stairs? N  Dressing or bathing? N  Doing errands, shopping? N  Preparing Food and eating ? N  Using the Toilet? N  In the past six months, have you accidently leaked urine? N  Do you have problems with loss of bowel control? N  Managing your Medications? N  Managing your Finances? N  Housekeeping or managing your Housekeeping? N  Some recent data might be hidden    Patient Care Team: Fayrene Helper, MD as PCP - [facility], Marga Melnick, MD (Inactive) (Gastroenterology) Irine Seal, MD as Attending Physician (Urology)  Indicate any recent Medical  Services you may have received from other than [facility] providers in the past year (date may be approximate).     Assessment:   This is a routine wellness examination for Craig Shaffer.  Hearing/Vision screen No exam data present  Dietary issues and exercise activities discussed: Current Exercise Habits: The patient has a physically strenuous job, but has no regular exercise apart from work.  Goals    . DIET - DECREASE SODA OR JUICE INTAKE     Drinks 1 liter out of a whole week     . Quit smoking / using tobacco     Starting 06/22/2016 I  would like to continue slowly weaning myself off of cigarettes. By the end of Februrary I would like to be down to 2-3 cigarettes a day.      Depression Screen PHQ 2/9 Scores 08/24/2020 08/11/2020 02/11/2020 11/11/2019 09/24/2019 07/03/2019 03/13/2019  PHQ - 2 Score 0 0 0 0 0 0 0    Fall Risk Fall Risk  08/24/2020 08/11/2020 08/11/2020 02/11/2020 11/11/2019  Falls in the past year? 0 0 0 0 0  Number falls in past yr: 0 - 0 - 0  Injury with Fall? 0 - 0 - 0  Risk for fall due to : - - - - -  Follow up - - - - -    [place]:  Any stairs in or around the home? Yes  If so, are there any without handrails? No  Home free of loose throw rugs in walkways, pet beds, electrical cords, etc? yes Adequate lighting in your home to reduce risk of falls? Yes   ASSISTIVE DEVICES UTILIZED TO PREVENT FALLS:  Life alert? No  Use of a cane, walker or w/c? No  Grab bars in the bathroom? Yes  Shower chair or bench in shower? No  Elevated toilet seat or a handicapped toilet? No   TIMED UP AND GO:  Was the test performed? No .  Length of time to ambulate 10 feet:  sec.     Cognitive Function:     6CIT Screen 08/24/2020 08/24/2020 07/03/2019 06/17/2018 07/18/2017  What Year? 0 points 0 points 0 points 0 points 0 points  What month? 0 points 0 points 0 points 0 points 0 points  What time? 0 points 0 points 0 points 0 points 0 points  Count back from 20  0 points 0 points 0 points 0 points 0 points  Months in reverse 0 points 0 points 0 points 0 points 0 points  Repeat phrase 0 points 0 points 0 points 0 points 0 points  Total Score 0 0 0 0 0    Immunizations Immunization History  Administered Date(s) Administered  . Influenza,inj,Quad PF,6+ Mos 04/04/2013, 03/16/2014, 03/10/2015  . PFIZER(Purple Top)SARS-COV-2 Vaccination 12/26/2019, 01/26/2020, 08/18/2020  . Td 02/18/2004     TDAP status: Due, Education has been provided regarding the importance of this vaccine. Advised may receive this vaccine at local pharmacy or Health Dept. Aware to provide a copy of the vaccination record if obtained from local pharmacy or Health Dept. Verbalized acceptance and understanding. Will get at the pharmacy  Flu Vaccine status: Up to date  Pneumococcal vaccine status: Up to date  Covid-19 vaccine status: Completed vaccines  Qualifies for Shingles Vaccine? Yes   Zostavax completed No   Shingrix Completed?: Yes - getting first shingrix shot tomorrow  Screening Tests Health Maintenance  Topic Date Due  . TETANUS/TDAP  02/17/2014  . INFLUENZA VACCINE  09/09/2020 (Originally 01/11/2020)  . COLONOSCOPY (Pts 45-68yrs Insurance coverage will need to be confirmed)  06/22/2021  . COVID-19 Vaccine  Completed  . Hepatitis C Screening  Completed  . HIV Screening  Completed  . HPV VACCINES  Aged Out    Health Maintenance  Health Maintenance Due  Topic Date Due  . TETANUS/TDAP  02/17/2014    Colorectal cancer screening: Type of screening: Colonoscopy. Completed 10. Repeat every 10 years  Lung Cancer Screening: (Low Dose CT Chest recommended if Age 44-80 years, 30 pack-year currently smoking OR have quit w/in 15years.) does not qualify.   Lung Cancer Screening  Referral:   Additional Screening:  Hepatitis C Screening: does not qualify  Vision Screening: Recommended annual ophthalmology exams for early detection of glaucoma and other disorders of  the eye. Is the patient up to date with their annual eye exam?  no  Who is the provider or what is the name of the office in which the patient attends annual eye exams? Doesn't want to see one now If pt is not established with a provider, would they like to be referred to a provider to establish care? No .   Dental Screening: Recommended annual dental exams for proper oral hygiene  Community Resource Referral / Chronic Care Management: CRR required this visit?  No   CCM required this visit?  No      Plan:     I have personally reviewed and noted the following in the patient's chart:   . Medical and social history . Use of alcohol, tobacco or illicit drugs  . Current medications and supplements . Functional ability and status . Nutritional status . Physical activity . Advanced directives . List of other physicians . Hospitalizations, surgeries, and ER visits in previous 12 months . Vitals . Screenings to include cognitive, depression, and falls . Referrals and appointments  In addition, I have reviewed and discussed with patient certain preventive protocols, quality metrics, and best practice recommendations. A written personalized care plan for preventive services as well as general preventive health recommendations were provided to patient.     Kate Sable, LPN, LPN   07/31/7586   Nurse Notes: Visit performed by telephone in the office, patient at home and supervising provider in the office. Patient consented to phone visit. Time spent with pt 25 mins.

## 2020-09-27 ENCOUNTER — Other Ambulatory Visit: Payer: Self-pay | Admitting: Family Medicine

## 2020-10-04 ENCOUNTER — Other Ambulatory Visit: Payer: Self-pay | Admitting: Family Medicine

## 2020-11-16 ENCOUNTER — Other Ambulatory Visit: Payer: Self-pay | Admitting: Internal Medicine

## 2020-12-31 ENCOUNTER — Other Ambulatory Visit: Payer: Self-pay | Admitting: Family Medicine

## 2021-03-02 ENCOUNTER — Encounter: Payer: Self-pay | Admitting: Family Medicine

## 2021-03-02 ENCOUNTER — Other Ambulatory Visit: Payer: Self-pay

## 2021-03-02 ENCOUNTER — Ambulatory Visit (INDEPENDENT_AMBULATORY_CARE_PROVIDER_SITE_OTHER): Payer: Medicare Other | Admitting: Family Medicine

## 2021-03-02 VITALS — BP 141/86 | HR 76 | Temp 97.8°F | Resp 18 | Ht 69.0 in | Wt 159.1 lb

## 2021-03-02 DIAGNOSIS — I10 Essential (primary) hypertension: Secondary | ICD-10-CM

## 2021-03-02 DIAGNOSIS — H547 Unspecified visual loss: Secondary | ICD-10-CM | POA: Insufficient documentation

## 2021-03-02 DIAGNOSIS — Z720 Tobacco use: Secondary | ICD-10-CM

## 2021-03-02 DIAGNOSIS — Z0000 Encounter for general adult medical examination without abnormal findings: Secondary | ICD-10-CM

## 2021-03-02 MED ORDER — SPIRONOLACTONE 25 MG PO TABS: 25.0000 mg | ORAL_TABLET | Freq: Every day | ORAL | 3 refills | Status: DC

## 2021-03-02 NOTE — Assessment & Plan Note (Signed)

## 2021-03-02 NOTE — Progress Notes (Signed)
   Craig Shaffer     MRN: [id number]      DOB: [birth year]   HPI: Patient is in for annual physical exam. Uncontrolled blood pressure  and smoking are also  addressed at the visit. Recent labs, if available are reviewed. Immunization is reviewed , and  updated     PE; BP (!) 141/86   Pulse 76   Temp 97.8 F (36.6 C)   Resp 18   Ht 5\' 9"  (1.753 m)   Wt 159 lb 1.3 oz (72.2 kg)   SpO2 94%   BMI 23.49 kg/m   Pleasant male, alert and oriented x 3, in no cardio-pulmonary distress. Afebrile. HEENT No facial trauma or asymetry. Sinuses non tender. EOMI External ears normal,  Neck: supple, no adenopathy,JVD or thyromegaly.No bruits.  Chest: Clear to ascultation bilaterally.No crackles or wheezes. Non tender to palpation  Cardiovascular system; Heart sounds normal,  S1 and  S2 ,no S3.  No murmur, or thrill. Apical beat not displaced Peripheral pulses normal.  Abdomen: Soft, non tender, no organomegaly or masses. No bruits. Bowel sounds normal. No guarding, tenderness or rebound.    Musculoskeletal exam: Decreased ROM of spine, and right hip and right knee , otherwise  normal. deformity ,noted. In RLE, otherwise normal Left lower extremity  muscle wasting and  atrophy.   Neurologic: Cranial nerves 2 to 12 intact. Reduced Powerand  tone ,in RLE , otherwise normal nt.  disturbance in gait. No tremor.  Skin: Intact, no ulceration, erythema , scaling or rash noted. Pigmentation normal throughout  Psych; Normal mood and affect. Judgement and concentration normal   Assessment & Plan:  Annual physical exam Annual exam as documented. Counseling done  re healthy lifestyle involving commitment to 150 minutes exercise per week, heart healthy diet, and attaining healthy weight.The importance of adequate sleep also discussed. Regular seat belt use and home safety, is also discussed. Changes in health habits are decided on by the patient with goals and time frames   set for achieving them. Immunization and cancer screening needs are specifically addressed at this visit.   Essential hypertension Uncontrolled medication added with close f/u DASH diet and commitment to daily physical activity for a minimum of 30 minutes discussed and encouraged, as a part of hypertension management. The importance of attaining a healthy weight is also discussed.  BP/Weight 03/02/2021 08/24/2020 08/11/2020 04/20/2020 02/11/2020 02/14/7095 07/20/3660  Systolic BP 947 - - 654 650 354 656  Diastolic BP 86 - - 78 72 80 76  Wt. (Lbs) 159.08 161 162 160 164.04 - 167  BMI 23.49 23.78 23.92 23.63 24.22 - 24.66       Tobacco abuse Asked:confirms currently smokes cigars, 3/day Assess: Unwilling to set a quit date, but is cutting back Advise: needs to QUIT to reduce risk of cancer, cardio and cerebrovascular disease Assist: counseled for 5 minutes and literature provided Arrange: follow up in 2 to 4 months   Poor vision Refer for eye exam

## 2021-03-02 NOTE — Patient Instructions (Addendum)
Annual exam in 12 months 1 day, call if you need me sooner  FU to re eval bP in 6 to 8 weeks  New additional medication for blood pressure is spironolactone 25 mg one daily,   continue all the medication you are taking  Smoking 3 cigars daily, you say you will quit in 6 weeks , GREAT  Nurse please send for and document the 2 shingrix vaccines he got at [facility] this year  CBC, chem 7 and EGFR 1 week before next visit  You are referred for eye exam due tom poor vision  Thanks for choosing [facility], we consider it a privelige to serve you.

## 2021-03-03 ENCOUNTER — Encounter: Payer: Self-pay | Admitting: Family Medicine

## 2021-03-03 NOTE — Assessment & Plan Note (Signed)
Refer for eye exam

## 2021-03-03 NOTE — Assessment & Plan Note (Signed)
Uncontrolled medication added with close f/u DASH diet and commitment to daily physical activity for a minimum of 30 minutes discussed and encouraged, as a part of hypertension management. The importance of attaining a healthy weight is also discussed.  BP/Weight 03/02/2021 08/24/2020 08/11/2020 04/20/2020 02/11/2020 09/13/7393 01/13/4170  Systolic BP 278 - - 718 367 255 001  Diastolic BP 86 - - 78 72 80 76  Wt. (Lbs) 159.08 161 162 160 164.04 - 167  BMI 23.49 23.78 23.92 23.63 24.22 - 24.66

## 2021-03-03 NOTE — Assessment & Plan Note (Signed)
Asked:confirms currently smokes cigars, 3/day Assess: Unwilling to set a quit date, but is cutting back Advise: needs to QUIT to reduce risk of cancer, cardio and cerebrovascular disease Assist: counseled for 5 minutes and literature provided Arrange: follow up in 2 to 4 months

## 2021-03-13 ENCOUNTER — Other Ambulatory Visit: Payer: Self-pay | Admitting: Family Medicine

## 2021-04-02 ENCOUNTER — Other Ambulatory Visit: Payer: Self-pay | Admitting: Family Medicine

## 2021-04-13 ENCOUNTER — Other Ambulatory Visit: Payer: Medicare Other

## 2021-04-13 ENCOUNTER — Other Ambulatory Visit: Payer: Self-pay

## 2021-04-13 DIAGNOSIS — R972 Elevated prostate specific antigen [PSA]: Secondary | ICD-10-CM

## 2021-04-14 ENCOUNTER — Other Ambulatory Visit: Payer: Medicare Other

## 2021-04-14 LAB — PSA, TOTAL AND FREE
PSA, Free Pct: 40.5 %
PSA, Free: 0.81 ng/mL
Prostate Specific Ag, Serum: 2 ng/mL (ref 0.0–4.0)

## 2021-04-19 ENCOUNTER — Encounter: Payer: Self-pay | Admitting: Family Medicine

## 2021-04-19 ENCOUNTER — Other Ambulatory Visit: Payer: Self-pay

## 2021-04-19 ENCOUNTER — Ambulatory Visit (INDEPENDENT_AMBULATORY_CARE_PROVIDER_SITE_OTHER): Payer: Medicare Other | Admitting: Family Medicine

## 2021-04-19 VITALS — BP 115/66 | HR 66 | Resp 16 | Ht 69.0 in | Wt 156.4 lb

## 2021-04-19 DIAGNOSIS — Z13220 Encounter for screening for lipoid disorders: Secondary | ICD-10-CM | POA: Diagnosis not present

## 2021-04-19 DIAGNOSIS — I10 Essential (primary) hypertension: Secondary | ICD-10-CM | POA: Diagnosis not present

## 2021-04-19 DIAGNOSIS — Z720 Tobacco use: Secondary | ICD-10-CM | POA: Diagnosis not present

## 2021-04-19 DIAGNOSIS — F17210 Nicotine dependence, cigarettes, uncomplicated: Secondary | ICD-10-CM | POA: Diagnosis not present

## 2021-04-19 DIAGNOSIS — E559 Vitamin D deficiency, unspecified: Secondary | ICD-10-CM

## 2021-04-19 MED ORDER — SPIRONOLACTONE 25 MG PO TABS: 25.0000 mg | ORAL_TABLET | Freq: Every day | ORAL | 3 refills | Status: DC

## 2021-04-19 NOTE — Progress Notes (Signed)
   Craig Shaffer     MRN: [id number]      DOB: 11/09/1960   HPI Craig Shaffer is here for follow up and re-evaluation of chronic medical conditions, medication management and review of any available recent lab and radiology data.  Preventive health is updated, specifically  Cancer screening and Immunization.   Questions or concerns regarding consultations or procedures which the PT has had in the interim are  addressed. The PT denies any adverse reactions to current medications since the last visit.  There are no new concerns.  There are no specific complaints   ROS Denies recent fever or chills. Denies sinus pressure, nasal congestion, ear pain or sore throat. Denies chest congestion, productive cough or wheezing. Denies chest pains, palpitations and leg swelling Denies abdominal pain, nausea, vomiting,diarrhea or constipation.   Denies dysuria, frequency, hesitancy or incontinence. Denies joint pain, swelling and limitation in mobility. Denies headaches, seizures, numbness, or tingling. Denies depression, anxiety or insomnia. Denies skin break down or rash.   PE  BP 115/66   Pulse 66   Resp 16   Ht 5\' 9"  (1.753 m)   Wt 156 lb 6.4 oz (70.9 kg)   SpO2 96%   BMI 23.10 kg/m   Patient alert and oriented and in no cardiopulmonary distress.  HEENT: No facial asymmetry, EOMI,     Neck supple .  Chest: Clear to auscultation bilaterally.  CVS: S1, S2 no murmurs, no S3.Regular rate.  ABD: Soft non tender.   Ext: No edema  MS: Adequate ROM spine, shoulders, hips and knees.  Skin: Intact, no ulcerations or rash noted.  Psych: Good eye contact, normal affect. Memory intact not anxious or depressed appearing.  CNS: CN 2-12 intact, power,  normal throughout.no focal deficits noted.   Assessment & Plan Essential hypertension Controlled, no change in medication DASH diet and commitment to daily physical activity for a minimum of 30 minutes discussed and encouraged, as  a part of hypertension management. The importance of attaining a healthy weight is also discussed.  BP/Weight 04/19/2021 03/02/2021 08/24/2020 08/11/2020 04/20/2020 02/14/6386 10/16/4330  Systolic BP 951 884 - - 166 063 016  Diastolic BP 66 86 - - 78 72 80  Wt. (Lbs) 156.4 159.08 161 162 160 164.04 -  BMI 23.1 23.49 23.78 23.92 23.63 24.22 -       Tobacco abuse Asked:confirms currently smokes cigarettes Assess: Unwilling to set a quit date, but is cutting back Advise: needs to QUIT to reduce risk of cancer, cardio and cerebrovascular disease Assist: counseled for 5 minutes and literature provided Arrange: follow up in 2 to 4 months

## 2021-04-19 NOTE — Assessment & Plan Note (Signed)
Controlled, no change in medication DASH diet and commitment to daily physical activity for a minimum of 30 minutes discussed and encouraged, as a part of hypertension management. The importance of attaining a healthy weight is also discussed.  BP/Weight 04/19/2021 03/02/2021 08/24/2020 08/11/2020 04/20/2020 3/0/0762 07/18/3333  Systolic BP 456 256 - - 389 373 428  Diastolic BP 66 86 - - 78 72 80  Wt. (Lbs) 156.4 159.08 161 162 160 164.04 -  BMI 23.1 23.49 23.78 23.92 23.63 24.22 -

## 2021-04-19 NOTE — Assessment & Plan Note (Signed)
Asked:confirms currently smokes cigarettes °Assess: Unwilling to set a quit date, but is cutting back °Advise: needs to QUIT to reduce risk of cancer, cardio and cerebrovascular disease °Assist: counseled for 5 minutes and literature provided °Arrange: follow up in 2 to 4 months ° °

## 2021-04-19 NOTE — Patient Instructions (Signed)
F/U in April.mid or late, call if you need me sooner   Continue 2 blood pressure plls and 1 cholesterol pill  Once  you finish the once weekly vit d you have, start OTC once daily vit D3, 10000 IU    Fasting lipid, cmp and eGFR, Vit D 1  week before April appointment It is important that you exercise regularly at least 30 minutes 5 times a week. If you develop chest pain, have severe difficulty breathing, or feel very tired, stop exercising immediately and seek medical attention   Think about what you will eat, plan ahead. Choose " clean, green, fresh or frozen" over canned, processed or packaged foods which are more sugary, salty and fatty. 70 to 75% of food eaten should be vegetables and fruit. Three meals at set times with snacks allowed between meals, but they must be fruit or vegetables. Aim to eat over a 12 hour period , example 7 am to 7 pm, and STOP after  your last meal of the day. Drink water,generally about 64 ounces per day, no other drink is as healthy. Fruit juice is best enjoyed in a healthy way, by EATING the fruit.  BEST for 2023!  Thanks for choosing [facility], we consider it a privelige to serve you.

## 2021-04-21 ENCOUNTER — Encounter: Payer: Self-pay | Admitting: Urology

## 2021-04-21 ENCOUNTER — Ambulatory Visit (INDEPENDENT_AMBULATORY_CARE_PROVIDER_SITE_OTHER): Payer: Medicare Other | Admitting: Urology

## 2021-04-21 ENCOUNTER — Other Ambulatory Visit: Payer: Self-pay

## 2021-04-21 VITALS — BP 134/77 | HR 88 | Temp 98.2°F

## 2021-04-21 DIAGNOSIS — N5201 Erectile dysfunction due to arterial insufficiency: Secondary | ICD-10-CM

## 2021-04-21 DIAGNOSIS — N403 Nodular prostate with lower urinary tract symptoms: Secondary | ICD-10-CM

## 2021-04-21 DIAGNOSIS — R972 Elevated prostate specific antigen [PSA]: Secondary | ICD-10-CM

## 2021-04-21 DIAGNOSIS — R351 Nocturia: Secondary | ICD-10-CM | POA: Diagnosis not present

## 2021-04-21 LAB — URINALYSIS, ROUTINE W REFLEX MICROSCOPIC
Bilirubin, UA: NEGATIVE
Glucose, UA: NEGATIVE
Ketones, UA: NEGATIVE
Leukocytes,UA: NEGATIVE
Nitrite, UA: NEGATIVE
RBC, UA: NEGATIVE
Specific Gravity, UA: 1.025 (ref 1.005–1.030)
Urobilinogen, Ur: 0.2 mg/dL (ref 0.2–1.0)
pH, UA: 6 (ref 5.0–7.5)

## 2021-04-21 LAB — MICROSCOPIC EXAMINATION
Bacteria, UA: NONE SEEN
RBC, Urine: NONE SEEN /hpf (ref 0–2)
Renal Epithel, UA: NONE SEEN /hpf
WBC, UA: NONE SEEN /hpf (ref 0–5)

## 2021-04-21 NOTE — Progress Notes (Signed)
Subjective:  1. Nodular prostate with lower urinary tract symptoms   2. Nocturia   3. Elevated PSA   4. Erectile dysfunction due to arterial insufficiency      My PSA is elevated above the normal range.    He has not been on antibiotics for prostate infections previously. He has had a prostate biopsy done. His first prostate biopsy was done approximately 02/10/2013 and it was negative.  04/20/20: Craig Shaffer returns today in f/u for his history of an elevated PSA and prostate nodule.  His PSA prior to this visit is stable at 3.0 with a 29% f/t ratio on 04/13/20.  He is voiding well with an IPSS of 3 and nocturia x 2.   His erectile dysfunction has improved and he no longer requires therapy.   04/21/21: Craig Shaffer returns today in f/u.  His PSA is down to 2.0 with a 40% f/t ratio.  He is voidng well with an IPSS of 5 and nocturia x 1.  He is still doing well with the erections without treatment.       IPSS     Row Name 04/21/21 0900         International Prostate Symptom Score   How often have you had the sensation of not emptying your bladder? Less than 1 in 5     How often have you had to urinate less than every two hours? Less than 1 in 5 times     How often have you found you stopped and started again several times when you urinated? Less than 1 in 5 times     How often have you found it difficult to postpone urination? Not at All     How often have you had a weak urinary stream? Not at All     How often have you had to strain to start urination? Less than 1 in 5 times     How many times did you typically get up at night to urinate? 1 Time     Total IPSS Score 5       Quality of Life due to urinary symptoms   If you were to spend the rest of your life with your urinary condition just the way it is now how would you feel about that? Pleased                ROS:  ROS:  A complete review of systems was performed.  All systems are negative except for pertinent findings as noted.    ROS  No Known Allergies  Outpatient Encounter Medications as of 04/21/2021  Medication Sig   atorvastatin (LIPITOR) 40 MG tablet TAKE 1 TABLET(40 MG) BY MOUTH DAILY   diltiazem (TIAZAC) 360 MG 24 hr capsule TAKE 1 CAPSULE BY MOUTH EVERY DAY   spironolactone (ALDACTONE) 25 MG tablet Take 1 tablet (25 mg total) by mouth daily.   [DISCONTINUED] spironolactone (ALDACTONE) 25 MG tablet Take 1 tablet (25 mg total) by mouth daily.   No facility-administered encounter medications on file as of 04/21/2021.    Past Medical History:  Diagnosis Date   Anemia 2012 MCV 76.2 HB 12.3 CR 1.0-1.24   FERRITIN 14    [facility]    ED (erectile dysfunction)    Elevated PSA    GERD (gastroesophageal reflux disease)    Hypertension    Nicotine addiction     Past Surgical History:  Procedure Laterality Date   APPENDECTOMY  04/2016  CHOLECYSTECTOMY  5/07   COLONOSCOPY  2002 [facility] POLYP   COLONOSCOPY  JAN 2013 [facility] ANEMIA   H POLYP, IH   GIVENS CAPSULE STUDY  10/05/2011   Procedure: GIVENS CAPSULE STUDY;  Surgeon: Danie Binder, MD;  Location: [facility] ENDO SUITE;  Service: Endoscopy;  Laterality: N/A;   LAPAROSCOPIC APPENDECTOMY N/A 05/11/2016   Procedure: APPENDECTOMY LAPAROSCOPIC;  Surgeon: Aviva Signs, MD;  Location: [facility] ORS;  Service: General;  Laterality: N/A;   UPPER GASTROINTESTINAL ENDOSCOPY  JAN 2013 ANEMIA   GASTRITIS, NL DUO Bx    Social History   Socioeconomic History   Marital status: Single    Spouse name: Not on file   Number of children: 0   Years of education: 65   Highest education level: 12th grade  Occupational History    Employer: UNEMPLOYED  Tobacco Use   Smoking status: Every Day    Packs/day: 0.25    Years: 20.00    Pack years: 5.00    Types: Cigarettes   Smokeless tobacco: Never   Tobacco comments:       Vaping Use   Vaping Use: Never used  Substance and Sexual Activity   Alcohol use: No    Alcohol/week:  0.0 standard drinks   Drug use: No   Sexual activity: Yes  Other Topics Concern   Not on file  Social History Narrative   Lives with sister and aunt. Aunt passed away two weeks ago    Social Determinants of Radio broadcast assistant Strain: Low Risk    Difficulty of Paying Living Expenses: Not hard at all  Food Insecurity: No Food Insecurity   Worried About Charity fundraiser in the Last Year: Never true   Arboriculturist in the Last Year: Never true  Transportation Needs: No Transportation Needs   Lack of Transportation (Medical): No   Lack of Transportation (Non-Medical): No  Physical Activity: Sufficiently Active   Days of Exercise per Week: 7 days   Minutes of Exercise per Session: 60 min  Stress: Not on file  Social Connections: Moderately Isolated   Frequency of Communication with Friends and Family: More than three times a week   Frequency of Social Gatherings with Friends and Family: More than three times a week   Attends Religious Services: More than 4 times per year   Active Member of [employer] or Organizations: No   Attends Archivist Meetings: Never   Marital Status: Never married  Human resources officer Violence: Not on file    Family History  Problem Relation Age of Onset   Hypertension Mother    Cancer Mother 76       metastatized to liver, died at age 27   Hypertension Sister    Hypertension Brother    Hypertension Sister    Hypertension Sister    Stroke Sister    Hypertension Sister    Heart disease Sister 56       stents in 4 valves, ruptd aorta   Hypertension Brother    Colon cancer Neg Hx        Objective: Vitals:   04/21/21 0918  BP: 134/77  Pulse: 88  Temp: 98.2 F (36.8 C)     Physical Exam Vitals reviewed.  Constitutional:      Appearance: Normal appearance.  Genitourinary:    Comments: AP moderate external hemorrhoids. NST without mass. Prostate 1.5+ benign.  SV non-palpable.  Neurological:     Mental Status:  He is  alert.    Lab Results:  Recent Results (from the past 2160 hour(s))  PSA, total and free     Status: None   Collection Time: 04/13/21  5:32 PM  Result Value Ref Range   Prostate Specific Ag, Serum 2.0 0.0 - 4.0 ng/mL    Comment: Roche ECLIA methodology. According to the American Urological Association, Serum PSA should decrease and remain at undetectable levels after radical prostatectomy. The AUA defines biochemical recurrence as an initial PSA value 0.2 ng/mL or greater followed by a subsequent confirmatory PSA value 0.2 ng/mL or greater. Values obtained with different assay methods or kits cannot be used interchangeably. Results cannot be interpreted as absolute evidence of the presence or absence of malignant disease.    PSA, Free 0.81 N/A ng/mL    Comment: Roche ECLIA methodology.   PSA, Free Pct 40.5 %    Comment: The table below lists the probability of prostate cancer for men with non-suspicious DRE results and total PSA between 4 and 10 ng/mL, by patient age Ricci Barker, [place], [id number]).                   % Free PSA       50-64 yr        65-75 yr                   0.00-10.00%        56%             55%                  10.01-15.00%        24%             35%                  15.01-20.00%        17%             23%                  20.01-25.00%        10%             20%                       >25.00%         5%              9% Please note:  Catalona et al did not make specific               recommendations regarding the use of               percent free PSA for any other population               of men.   Urinalysis, Routine w reflex microscopic     Status: Abnormal   Collection Time: 04/21/21  9:28 AM  Result Value Ref Range   Specific Gravity, UA 1.025 1.005 - 1.030   pH, UA 6.0 5.0 - 7.5   Color, UA Yellow Yellow   Appearance Ur Clear Clear   Leukocytes,UA Negative Negative   Protein,UA 1+ (A) Negative/Trace   Glucose, UA Negative Negative   Ketones,  UA Negative Negative   RBC, UA Negative Negative   Bilirubin, UA Negative Negative   Urobilinogen, Ur 0.2 0.2 - 1.0 mg/dL   Nitrite, UA Negative Negative  Microscopic Examination See below:   Microscopic Examination     Status: None   Collection Time: 04/21/21  9:28 AM   Urine  Result Value Ref Range   WBC, UA None seen 0 - 5 /hpf   RBC None seen 0 - 2 /hpf   Epithelial Cells (non renal) 0-10 0 - 10 /hpf   Renal Epithel, UA None seen None seen /hpf   Mucus, UA Present Not Estab.   Bacteria, UA None seen None seen/Few     PSA PSA  Date Value Ref Range Status  11/19/2018 2.5 < OR = 4.0 ng/mL Final    Comment:    The total PSA value from this assay system is  standardized against the WHO standard. The test  result will be approximately 20% lower when compared  to the equimolar-standardized total PSA (Beckman  Coulter). Comparison of serial PSA results should be  interpreted with this fact in mind. . This test was performed using the Siemens  chemiluminescent method. Values obtained from  different assay methods cannot be used interchangeably. PSA levels, regardless of value, should not be interpreted as absolute evidence of the presence or absence of disease.   09/30/2014 3.49 <=4.00 ng/mL Final    Comment:    Test Methodology: ECLIA PSA (Electrochemiluminescence Immunoassay)   For PSA values from 2.5-4.0, particularly in younger men <23 years old, the AUA and NCCN suggest testing for % Free PSA (3515) and evaluation of the rate of increase in PSA (PSA velocity).   08/30/2012 2.60 <=4.00 ng/mL Final    Comment:    Test Methodology: ECLIA PSA (Electrochemiluminescence Immunoassay)   For PSA values from 2.5-4.0, particularly in younger men <62 years old, the AUA and NCCN suggest testing for % Free PSA (3515) and evaluation of the rate of increase in PSA (PSA velocity).   No results found for: TESTOSTERONE    Studies/Results: No results found.  Past records  reviewed.   Assessment & Plan:  History of prostate nodule with an elevated PSA.  His PSA is down further and his exam is unremarkable.  He will return prn and can just get the annual PSA with Dr. Moshe Cipro.  ED.  He not requiring treatment at this time.  No orders of the defined types were placed in this encounter.    Orders Placed This Encounter  Procedures   Microscopic Examination   Urinalysis, Routine w reflex microscopic      Return if symptoms worsen or fail to improve.   CC: Fayrene Helper, MD      Craig Shaffer 04/21/2021 Patient ID: Craig Shaffer, male   DOB: May 07, 1961, 60 y.o.   MRN: [id number]

## 2021-04-21 NOTE — Progress Notes (Signed)

## 2021-05-16 ENCOUNTER — Telehealth: Payer: Self-pay

## 2021-05-16 MED ORDER — SILDENAFIL CITRATE 20 MG PO TABS: ORAL_TABLET | ORAL | 5 refills | Status: DC

## 2021-05-16 NOTE — Telephone Encounter (Signed)
Rx sent. Patient called and made aware. 

## 2021-06-10 ENCOUNTER — Encounter: Payer: Self-pay | Admitting: *Deleted

## 2021-06-21 ENCOUNTER — Encounter: Payer: Self-pay | Admitting: *Deleted

## 2021-06-24 ENCOUNTER — Other Ambulatory Visit: Payer: Self-pay | Admitting: Family Medicine

## 2021-06-30 ENCOUNTER — Other Ambulatory Visit: Payer: Self-pay | Admitting: Family Medicine

## 2021-07-20 ENCOUNTER — Telehealth: Payer: Self-pay | Admitting: Family Medicine

## 2021-07-20 ENCOUNTER — Telehealth: Payer: Self-pay

## 2021-07-20 NOTE — Telephone Encounter (Signed)
error 

## 2021-07-20 NOTE — Telephone Encounter (Signed)
Harriett w, [facility] housecalls  Called inon pt behalf in regard to  PAD test  NP Harriett did circulation test and pt has mild to moderate circulation in L leg    Harriett w/ [facility] house call    [phone]

## 2021-07-21 ENCOUNTER — Other Ambulatory Visit: Payer: Self-pay

## 2021-07-21 DIAGNOSIS — I739 Peripheral vascular disease, unspecified: Secondary | ICD-10-CM

## 2021-07-21 NOTE — Telephone Encounter (Signed)
Patient aware.

## 2021-07-28 ENCOUNTER — Ambulatory Visit (INDEPENDENT_AMBULATORY_CARE_PROVIDER_SITE_OTHER): Payer: Self-pay | Admitting: *Deleted

## 2021-07-28 ENCOUNTER — Other Ambulatory Visit: Payer: Self-pay

## 2021-07-28 VITALS — Ht 68.0 in | Wt 162.4 lb

## 2021-07-28 DIAGNOSIS — Z1211 Encounter for screening for malignant neoplasm of colon: Secondary | ICD-10-CM

## 2021-07-28 NOTE — Progress Notes (Addendum)
Gastroenterology Pre-Procedure Review  Request Date: 07/28/2021 Requesting Physician: 10 year recall, Last TCS done 06/23/2011 by Dr. Oneida Alar, hyperplastic polyps, internal hemorrhoids  PATIENT REVIEW QUESTIONS: The patient responded to the following health history questions as indicated:    1. Diabetes Melitis: no 2. Joint replacements in the past 12 months: no 3. Major health problems in the past 3 months: no 4. Has an artificial valve or MVP: no 5. Has a defibrillator: no 6. Has been advised in past to take antibiotics in advance of a procedure like teeth cleaning: no 7. Family history of colon cancer: no  8. Alcohol Use: no 9. Illicit drug Use: no 10. History of sleep apnea: no  11. History of coronary artery or other vascular stents placed within the last 12 months: no 12. History of any prior anesthesia complications: no 13. Body mass index is 24.69 kg/m.    MEDICATIONS & ALLERGIES:    Patient reports the following regarding taking any blood thinners:   Plavix? no Aspirin? no Coumadin? no Brilinta? no Xarelto? no Eliquis? no Pradaxa? no Savaysa? no Effient? no  Patient confirms/reports the following medications:  Current Outpatient Medications  Medication Sig Dispense Refill   atorvastatin (LIPITOR) 40 MG tablet TAKE 1 TABLET(40 MG) BY MOUTH DAILY 90 tablet 3   Cholecalciferol (VITAMIN D3) 250 MCG (10000 UT) TABS Take by mouth daily at 6 (six) AM.     diltiazem (TIAZAC) 360 MG 24 hr capsule TAKE 1 CAPSULE BY MOUTH EVERY DAY 90 capsule 0   sildenafil (REVATIO) 20 MG tablet sildenafil 20mg  1 to 5 tablets by mouth daily as needed for erectile dysfunction. 30 tablet 5   spironolactone (ALDACTONE) 25 MG tablet Take 1 tablet (25 mg total) by mouth daily. 90 tablet 3   No current facility-administered medications for this visit.    Patient confirms/reports the following allergies:  No Known Allergies  No orders of the defined types were placed in this  encounter.   AUTHORIZATION INFORMATION Primary Insurance: UHC Medicare,  [place] #[id number]: ,  Group #: [id number] Pre-Cert / Auth required: Yes, approved online 09/25/8307-09/17/6806 Pre-Cert / Josem Kaufmann #: [id number]  Secondary Insurance: Medicaid [place] [employer],  ID #: [id number] P Pre-Cert / Josem Kaufmann required: No, not required  SCHEDULE INFORMATION: Procedure has been scheduled as follows:  Date: 08/05/2021, Time:  10:30 Location: [facility] with Dr. Abbey Chatters  This Gastroenterology Pre-Precedure Review Form is being routed to the following provider(s): Neil Crouch, PA-C

## 2021-07-28 NOTE — Progress Notes (Signed)
Ok to schedule. ASA 2. He is on diuretic.

## 2021-08-01 ENCOUNTER — Encounter: Payer: Self-pay | Admitting: *Deleted

## 2021-08-01 ENCOUNTER — Other Ambulatory Visit: Payer: Self-pay | Admitting: *Deleted

## 2021-08-01 DIAGNOSIS — Z1211 Encounter for screening for malignant neoplasm of colon: Secondary | ICD-10-CM

## 2021-08-01 MED ORDER — PEG 3350-KCL-NA BICARB-NACL 420 G PO SOLR: 4000.0000 mL | Freq: Once | ORAL | 0 refills | Status: AC

## 2021-08-01 NOTE — Progress Notes (Signed)
Spoke to pt.  Scheduled procedure for 08/05/2021 with arrival at [facility] at 9:00.  Reviewed prep instructions with pt by phone.  Pt advised to have labs drawn at [facility] on 08/03/2021.  Pt made aware to purchase an OTC enema and dulcolax.  He is aware that I am faxing his prep instructions to [facility] as well.  Pt voiced understanding.

## 2021-08-01 NOTE — Addendum Note (Signed)
Addended by: Metro Kung on: 08/01/2021 09:14 AM   Modules accepted: Orders

## 2021-08-03 ENCOUNTER — Other Ambulatory Visit (HOSPITAL_COMMUNITY)
Admission: RE | Admit: 2021-08-03 | Discharge: 2021-08-03 | Disposition: A | Discharge status: Home / Self Care | Payer: Medicare Other | Source: Ambulatory Visit | Attending: Internal Medicine | Admitting: Internal Medicine

## 2021-08-03 DIAGNOSIS — Z1211 Encounter for screening for malignant neoplasm of colon: Secondary | ICD-10-CM | POA: Insufficient documentation

## 2021-08-03 LAB — BASIC METABOLIC PANEL
Anion gap: 6 (ref 5–15)
BUN: 20 mg/dL (ref 8–23)
CO2: 27 mmol/L (ref 22–32)
Calcium: 9 mg/dL (ref 8.9–10.3)
Chloride: 102 mmol/L (ref 98–111)
Creatinine, Ser: 1.34 mg/dL — ABNORMAL HIGH (ref 0.61–1.24)
GFR, Estimated: 60 mL/min — ABNORMAL LOW (ref >60)
Glucose, Bld: 92 mg/dL (ref 70–99)
Potassium: 4 mmol/L (ref 3.5–5.1)
Sodium: 135 mmol/L (ref 135–145)

## 2021-08-05 ENCOUNTER — Encounter (HOSPITAL_COMMUNITY)
Admission: RE | Disposition: A | Discharge status: Home / Self Care | Payer: Self-pay | Source: Home / Self Care | Attending: Internal Medicine

## 2021-08-05 ENCOUNTER — Encounter (HOSPITAL_COMMUNITY): Payer: Self-pay

## 2021-08-05 ENCOUNTER — Ambulatory Visit (HOSPITAL_COMMUNITY): Payer: Medicare Other | Admitting: Anesthesiology

## 2021-08-05 ENCOUNTER — Other Ambulatory Visit: Payer: Self-pay

## 2021-08-05 ENCOUNTER — Ambulatory Visit (HOSPITAL_BASED_OUTPATIENT_CLINIC_OR_DEPARTMENT_OTHER): Payer: Medicare Other | Admitting: Anesthesiology

## 2021-08-05 ENCOUNTER — Ambulatory Visit (HOSPITAL_COMMUNITY)
Admission: RE | Admit: 2021-08-05 | Discharge: 2021-08-05 | Disposition: A | Discharge status: Home / Self Care | Payer: Medicare Other | Attending: Internal Medicine | Admitting: Internal Medicine

## 2021-08-05 DIAGNOSIS — Z1211 Encounter for screening for malignant neoplasm of colon: Secondary | ICD-10-CM | POA: Diagnosis not present

## 2021-08-05 DIAGNOSIS — Z139 Encounter for screening, unspecified: Secondary | ICD-10-CM | POA: Diagnosis not present

## 2021-08-05 DIAGNOSIS — K635 Polyp of colon: Secondary | ICD-10-CM | POA: Diagnosis not present

## 2021-08-05 DIAGNOSIS — K648 Other hemorrhoids: Secondary | ICD-10-CM | POA: Insufficient documentation

## 2021-08-05 DIAGNOSIS — D125 Benign neoplasm of sigmoid colon: Secondary | ICD-10-CM | POA: Diagnosis not present

## 2021-08-05 DIAGNOSIS — I10 Essential (primary) hypertension: Secondary | ICD-10-CM | POA: Insufficient documentation

## 2021-08-05 DIAGNOSIS — D123 Benign neoplasm of transverse colon: Secondary | ICD-10-CM | POA: Diagnosis not present

## 2021-08-05 DIAGNOSIS — F17210 Nicotine dependence, cigarettes, uncomplicated: Secondary | ICD-10-CM | POA: Diagnosis not present

## 2021-08-05 DIAGNOSIS — K219 Gastro-esophageal reflux disease without esophagitis: Secondary | ICD-10-CM | POA: Diagnosis not present

## 2021-08-05 HISTORY — PX: COLONOSCOPY WITH PROPOFOL: SHX5780

## 2021-08-05 HISTORY — PX: BIOPSY: SHX5522

## 2021-08-05 HISTORY — PX: POLYPECTOMY: SHX5525

## 2021-08-05 SURGERY — COLONOSCOPY WITH PROPOFOL
Anesthesia: General

## 2021-08-05 MED ORDER — LACTATED RINGERS IV SOLN: INTRAVENOUS | Status: DC

## 2021-08-05 MED ORDER — PROPOFOL 500 MG/50ML IV EMUL
INTRAVENOUS | Status: DC | PRN
  Administered 2021-08-05: 150 ug/kg/min via INTRAVENOUS

## 2021-08-05 MED ORDER — LIDOCAINE HCL (CARDIAC) PF 100 MG/5ML IV SOSY
PREFILLED_SYRINGE | INTRAVENOUS | Status: DC | PRN
  Administered 2021-08-05: 50 mg via INTRAVENOUS

## 2021-08-05 MED ORDER — PROPOFOL 10 MG/ML IV BOLUS
INTRAVENOUS | Status: DC | PRN
  Administered 2021-08-05: 100 mg via INTRAVENOUS

## 2021-08-05 NOTE — Anesthesia Postprocedure Evaluation (Signed)
Anesthesia Post Note  Patient: Craig Shaffer  Procedure(s) Performed: COLONOSCOPY WITH PROPOFOL POLYPECTOMY BIOPSY  Patient location during evaluation: Endoscopy Anesthesia Type: General Level of consciousness: awake and alert and oriented Pain management: pain level controlled Vital Signs Assessment: post-procedure vital signs reviewed and stable Respiratory status: spontaneous breathing, nonlabored ventilation and respiratory function stable Cardiovascular status: blood pressure returned to baseline and stable Postop Assessment: no apparent nausea or vomiting Anesthetic complications: no   No notable events documented.   Last Vitals:  Vitals:   08/05/21 1009 08/05/21 1011  BP: (!) 90/57 (!) 101/58  Pulse:    Resp: 14   Temp: 36.6 C   SpO2: 97%     Last Pain:  Vitals:   08/05/21 1011  TempSrc:   PainSc: 0-No pain                 Brie Eppard C Jamiee Milholland

## 2021-08-05 NOTE — Op Note (Signed)
[facility] Patient Name: Craig Shaffer Procedure Date: 08/05/2021 9:32 AM MRN: [id number] Date of Birth: [birth year] Attending MD: Elon Alas. Edgar Frisk CSN: [id number] Age: 61 Admit Type: Ambulatory Procedure:                Colonoscopy Indications:              Screening for colorectal malignant neoplasm Providers:                Elon Alas. Abbey Chatters, DO, Lambert Mody, Aram Candela Referring MD:              Medicines:                See the Anesthesia note for documentation of the                            administered medications Complications:            No immediate complications. Estimated Blood Loss:     Estimated blood loss was minimal. Procedure:                Pre-Anesthesia Assessment:                           - The anesthesia plan was to use monitored                            anesthesia care (MAC).                           After obtaining informed consent, the colonoscope                            was passed under direct vision. Throughout the                            procedure, the patient's blood pressure, pulse, and                            oxygen saturations were monitored continuously. The                            [id number]-HQ190L ([id number]) scope was introduced through                            the anus and advanced to the the cecum, identified                            by appendiceal orifice and ileocecal valve. The                            colonoscopy was performed without difficulty. The                            patient tolerated the procedure well. The quality  of the bowel preparation was evaluated using the                            BBPS [facility] Bowel Preparation Scale) with scores                            of: Right Colon = 3, Transverse Colon = 3 and Left                            Colon = 3 (entire mucosa seen well with no residual                            staining, small  fragments of stool or opaque                            liquid). The total BBPS score equals 9. Scope In: 9:54:05 AM Scope Out: 10:06:30 AM Scope Withdrawal Time: 0 hours 9 minutes 43 seconds  Total Procedure Duration: 0 hours 12 minutes 25 seconds  Findings:      The perianal and digital rectal examinations were normal.      Non-bleeding internal hemorrhoids were found during endoscopy.      A 2 mm polyp was found in the transverse colon. The polyp was sessile.       The polyp was removed with a cold biopsy forceps. Resection and       retrieval were complete.      A 6 mm polyp was found in the sigmoid colon. The polyp was sessile. The       polyp was removed with a cold snare. Resection and retrieval were       complete.      A 2 mm polyp was found in the sigmoid colon. The polyp was sessile. The       polyp was removed with a cold biopsy forceps. Resection and retrieval       were complete.      The exam was otherwise without abnormality. Impression:               - Non-bleeding internal hemorrhoids.                           - One 2 mm polyp in the transverse colon, removed                            with a cold biopsy forceps. Resected and retrieved.                           - One 6 mm polyp in the sigmoid colon, removed with                            a cold snare. Resected and retrieved.                           - One 2 mm polyp in the sigmoid colon, removed with  a cold biopsy forceps. Resected and retrieved.                           - The examination was otherwise normal. Moderate Sedation:      Per Anesthesia Care Recommendation:           - Patient has a contact number available for                            emergencies. The signs and symptoms of potential                            delayed complications were discussed with the                            patient. Return to normal activities tomorrow.                            Written  discharge instructions were provided to the                            patient.                           - Resume previous diet.                           - Continue present medications.                           - Await pathology results.                           - Repeat colonoscopy in 5-10 years for surveillance.                           - Return to GI clinic PRN. Procedure Code(s):        --- Professional ---                           [phone], Colonoscopy, flexible; with removal of                            tumor(s), polyp(s), or other lesion(s) by snare                            technique                           45380, 10, Colonoscopy, flexible; with biopsy,                            single or multiple Diagnosis Code(s):        --- Professional ---                           Z12.11, Encounter for screening for malignant  neoplasm of colon                           K63.5, Polyp of colon                           K64.8, Other hemorrhoids CPT copyright 2019 American Medical Association. All rights reserved. The codes documented in this report are preliminary and upon coder review may  be revised to meet current compliance requirements. Elon Alas. Abbey Chatters, DO [place] Abbey Chatters, DO 08/05/2021 10:08:06 AM This report has been signed electronically. Number of Addenda: 0

## 2021-08-05 NOTE — Transfer of Care (Signed)
Immediate Anesthesia Transfer of Care Note  Patient: Craig Shaffer  Procedure(s) Performed: COLONOSCOPY WITH PROPOFOL POLYPECTOMY BIOPSY  Patient Location: Endoscopy Unit  Anesthesia Type:General  Level of Consciousness: awake  Airway & Oxygen Therapy: Patient Spontanous Breathing  Post-op Assessment: Report given to RN and Post -op Vital signs reviewed and stable  Post vital signs: Reviewed and stable  Last Vitals:  Vitals Value Taken Time  BP    Temp    Pulse    Resp    SpO2      Last Pain:  Vitals:   08/05/21 0951  TempSrc:   PainSc: 0-No pain         Complications: No notable events documented.

## 2021-08-05 NOTE — Anesthesia Preprocedure Evaluation (Addendum)
Anesthesia Evaluation  Patient identified by MRN, date of birth, ID band Patient awake    Reviewed: Allergy & Precautions, NPO status , Patient's Chart, lab work & pertinent test results  Airway Mallampati: II  TM Distance: >3 FB Neck ROM: Full    Dental  (+) Edentulous Upper, Edentulous Lower   Pulmonary Current Smoker and Patient abstained from smoking.,    Pulmonary exam normal breath sounds clear to auscultation       Cardiovascular Exercise Tolerance: Good hypertension, Pt. on medications Normal cardiovascular exam Rhythm:Regular Rate:Normal     Neuro/Psych  Neuromuscular disease (Demyelinating disease of central nervous system (HCC)) negative psych ROS   GI/Hepatic Neg liver ROS, GERD  Controlled,  Endo/Other  negative endocrine ROS  Renal/GU negative Renal ROS  negative genitourinary   Musculoskeletal negative musculoskeletal ROS (+)   Abdominal   Peds negative pediatric ROS (+)  Hematology  (+) Blood dyscrasia, anemia ,   Anesthesia Other Findings   Reproductive/Obstetrics negative OB ROS                            Anesthesia Physical Anesthesia Plan  ASA: 2  Anesthesia Plan: General   Post-op Pain Management: Minimal or no pain anticipated   Induction: Intravenous  PONV Risk Score and Plan: TIVA  Airway Management Planned: Nasal Cannula and Natural Airway  Additional Equipment:   Intra-op Plan:   Post-operative Plan:   Informed Consent: I have reviewed the patients History and Physical, chart, labs and discussed the procedure including the risks, benefits and alternatives for the proposed anesthesia with the patient or authorized representative who has indicated his/her understanding and acceptance.       Plan Discussed with: CRNA and Surgeon  Anesthesia Plan Comments:        Anesthesia Quick Evaluation

## 2021-08-05 NOTE — Discharge Instructions (Addendum)
°  Colonoscopy Discharge Instructions  Read the instructions outlined below and refer to this sheet in the next few weeks. These discharge instructions provide you with general information on caring for yourself after you leave the hospital. Your doctor may also give you specific instructions. While your treatment has been planned according to the most current medical practices available, unavoidable complications occasionally occur.   ACTIVITY You may resume your regular activity, but move at a slower pace for the next 24 hours.  Take frequent rest periods for the next 24 hours.  Walking will help get rid of the air and reduce the bloated feeling in your belly (abdomen).  No driving for 24 hours (because of the medicine (anesthesia) used during the test).   Do not sign any important legal documents or operate any machinery for 24 hours (because of the anesthesia used during the test).  NUTRITION Drink plenty of fluids.  You may resume your normal diet as instructed by your doctor.  Begin with a light meal and progress to your normal diet. Heavy or fried foods are harder to digest and may make you feel sick to your stomach (nauseated).  Avoid alcoholic beverages for 24 hours or as instructed.  MEDICATIONS You may resume your normal medications unless your doctor tells you otherwise.  WHAT YOU CAN EXPECT TODAY Some feelings of bloating in the abdomen.  Passage of more gas than usual.  Spotting of blood in your stool or on the toilet paper.  IF YOU HAD POLYPS REMOVED DURING THE COLONOSCOPY: No aspirin products for 7 days or as instructed.  No alcohol for 7 days or as instructed.  Eat a soft diet for the next 24 hours.  FINDING OUT THE RESULTS OF YOUR TEST Not all test results are available during your visit. If your test results are not back during the visit, make an appointment with your caregiver to find out the results. Do not assume everything is normal if you have not heard from your  caregiver or the medical facility. It is important for you to follow up on all of your test results.  SEEK IMMEDIATE MEDICAL ATTENTION IF: You have more than a spotting of blood in your stool.  Your belly is swollen (abdominal distention).  You are nauseated or vomiting.  You have a temperature over 101.  You have abdominal pain or discomfort that is severe or gets worse throughout the day.   Your colonoscopy revealed 3 polyp(s) which I removed successfully. Await pathology results, my office will contact you. I recommend repeating colonoscopy in 5-10 years for surveillance purposes depending on pathology. Otherwise follow up with GI as needed.   I hope you have a great rest of your week!  Elon Alas. Abbey Chatters, D.O. Gastroenterology and Hepatology [facility]

## 2021-08-05 NOTE — H&P (Signed)
Primary Care Physician:  Fayrene Helper, MD Primary Gastroenterologist:  Dr. Abbey Chatters  Pre-Procedure History & Physical: HPI:  Craig Shaffer is a 61 y.o. male is here for a colonoscopy for colon cancer screening purposes.  Patient denies any family history of colorectal cancer.  No melena or hematochezia.  No abdominal pain or unintentional weight loss.  No change in bowel habits.  Overall feels well from a GI standpoint.  Past Medical History:  Diagnosis Date   Anemia 2012 MCV 76.2 HB 12.3 CR 1.0-1.24   FERRITIN 14    Demyelinating disease of central nervous system [facility])    ED (erectile dysfunction)    Elevated PSA    GERD (gastroesophageal reflux disease)    Hypertension    Nicotine addiction     Past Surgical History:  Procedure Laterality Date   APPENDECTOMY  04/2016   CHOLECYSTECTOMY  5/07   COLONOSCOPY  2002 DR SMITH   [place] POLYP   COLONOSCOPY  JAN 2013 [facility] ANEMIA   H POLYP, IH   GIVENS CAPSULE STUDY  10/05/2011   Procedure: GIVENS CAPSULE STUDY;  Surgeon: Danie Binder, MD;  Location: [facility] ENDO SUITE;  Service: Endoscopy;  Laterality: N/A;   LAPAROSCOPIC APPENDECTOMY N/A 05/11/2016   Procedure: APPENDECTOMY LAPAROSCOPIC;  Surgeon: Aviva Signs, MD;  Location: [facility] ORS;  Service: General;  Laterality: N/A;   UPPER GASTROINTESTINAL ENDOSCOPY  JAN 2013 ANEMIA   GASTRITIS, NL DUO Bx    Prior to Admission medications   Medication Sig Start Date End Date Taking? Authorizing Provider  atorvastatin (LIPITOR) 40 MG tablet TAKE 1 TABLET(40 MG) BY MOUTH DAILY 03/14/21  Yes Fayrene Helper, MD  Cholecalciferol (VITAMIN D3) 250 MCG (10000 UT) TABS Take 10,000 Units by mouth daily at 6 (six) AM.   Yes [provider]  diltiazem (TIAZAC) 360 MG 24 hr capsule TAKE 1 CAPSULE BY MOUTH EVERY DAY 06/30/21  Yes Fayrene Helper, MD  sildenafil (REVATIO) 20 MG tablet sildenafil 20mg  1 to 5 tablets by mouth daily as needed for erectile dysfunction. 05/16/21  Yes Irine Seal, MD   spironolactone (ALDACTONE) 25 MG tablet Take 1 tablet (25 mg total) by mouth daily. 04/19/21  Yes Fayrene Helper, MD    Allergies as of 08/01/2021   (No Known Allergies)    Family History  Problem Relation Age of Onset   Hypertension Mother    Cancer Mother 19       metastatized to liver, died at age 62   Hypertension Sister    Hypertension Brother    Hypertension Sister    Hypertension Sister    Stroke Sister    Hypertension Sister    Heart disease Sister 10       stents in 4 valves, ruptd aorta   Hypertension Brother    Colon cancer Neg Hx     Social History   Socioeconomic History   Marital status: Single    Spouse name: Not on file   Number of children: 0   Years of education: 12   Highest education level: 12th grade  Occupational History    Employer: UNEMPLOYED  Tobacco Use   Smoking status: Every Day    Packs/day: 0.25    Years: 20.00    Pack years: 5.00    Types: Cigarettes   Smokeless tobacco: Never   Tobacco comments:       Vaping Use   Vaping Use: Never used  Substance and Sexual Activity   Alcohol use: No  Alcohol/week: 0.0 standard drinks   Drug use: No   Sexual activity: Yes  Other Topics Concern   Not on file  Social History Narrative   Lives with sister and aunt. Aunt passed away two weeks ago    Social Determinants of Radio broadcast assistant Strain: Low Risk    Difficulty of Paying Living Expenses: Not hard at all  Food Insecurity: No Food Insecurity   Worried About Charity fundraiser in the Last Year: Never true   Arboriculturist in the Last Year: Never true  Transportation Needs: No Transportation Needs   Lack of Transportation (Medical): No   Lack of Transportation (Non-Medical): No  Physical Activity: Sufficiently Active   Days of Exercise per Week: 7 days   Minutes of Exercise per Session: 60 min  Stress: Not on file  Social Connections: Moderately Isolated   Frequency of Communication with Friends and Family:  More than three times a week   Frequency of Social Gatherings with Friends and Family: More than three times a week   Attends Religious Services: More than 4 times per year   Active Member of Clubs or Organizations: No   Attends Music therapist: Never   Marital Status: Never married  Human resources officer Violence: Not on file    Review of Systems: See HPI, otherwise negative ROS  Physical Exam: Vital signs in last 24 hours: Temp:  [98.4 F (36.9 C)] 98.4 F (36.9 C) (02/24 0918) Pulse Rate:  [87] 87 (02/24 0918) Resp:  [17] 17 (02/24 0918) BP: (142)/(84) 142/84 (02/24 0918) SpO2:  [99 %] 99 % (02/24 0918)   General:   Alert,  Well-developed, well-nourished, pleasant and cooperative in NAD Head:  Normocephalic and atraumatic. Eyes:  Sclera clear, no icterus.   Conjunctiva pink. Ears:  Normal auditory acuity. Nose:  No deformity, discharge,  or lesions. Mouth:  No deformity or lesions, dentition normal. Neck:  Supple; no masses or thyromegaly. Lungs:  Clear throughout to auscultation.   No wheezes, crackles, or rhonchi. No acute distress. Heart:  Regular rate and rhythm; no murmurs, clicks, rubs,  or gallops. Abdomen:  Soft, nontender and nondistended. No masses, hepatosplenomegaly or hernias noted. Normal bowel sounds, without guarding, and without rebound.   Msk:  Symmetrical without gross deformities. Normal posture. Extremities:  Without clubbing or edema. Neurologic:  Alert and  oriented x4;  grossly normal neurologically. Skin:  Intact without significant lesions or rashes. Cervical Nodes:  No significant cervical adenopathy. Psych:  Alert and cooperative. Normal mood and affect.  Impression/Plan: Craig Shaffer is here for a colonoscopy to be performed for colon cancer screening purposes.  The risks of the procedure including infection, bleed, or perforation as well as benefits, limitations, alternatives and imponderables have been reviewed with the  patient. Questions have been answered. All parties agreeable.

## 2021-08-09 ENCOUNTER — Other Ambulatory Visit: Payer: Self-pay | Admitting: *Deleted

## 2021-08-09 DIAGNOSIS — M25569 Pain in unspecified knee: Secondary | ICD-10-CM

## 2021-08-09 LAB — SURGICAL PATHOLOGY

## 2021-08-10 ENCOUNTER — Other Ambulatory Visit: Payer: Self-pay

## 2021-08-10 ENCOUNTER — Encounter (HOSPITAL_COMMUNITY): Payer: Self-pay | Admitting: Internal Medicine

## 2021-08-10 ENCOUNTER — Ambulatory Visit (INDEPENDENT_AMBULATORY_CARE_PROVIDER_SITE_OTHER): Payer: Medicare Other

## 2021-08-10 ENCOUNTER — Ambulatory Visit (INDEPENDENT_AMBULATORY_CARE_PROVIDER_SITE_OTHER): Payer: Medicare Other | Admitting: Vascular Surgery

## 2021-08-10 VITALS — BP 147/76 | HR 62 | Temp 98.1°F | Resp 16 | Ht 68.0 in | Wt 160.4 lb

## 2021-08-10 DIAGNOSIS — M25569 Pain in unspecified knee: Secondary | ICD-10-CM

## 2021-08-10 DIAGNOSIS — R6889 Other general symptoms and signs: Secondary | ICD-10-CM | POA: Diagnosis not present

## 2021-08-10 NOTE — Progress Notes (Signed)
? ? ?[facility] [place] ? ?Patient name: Craig Shaffer MRN: [id number] DOB: 12/10/1960 Sex: male ? ?REASON FOR CONSULT: Evaluation lower extremity arterial insufficiency with abnormal screening study ? ?HPI: ?Craig Shaffer is a 61 y.o. male, who is here today for evaluation.  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  Was found to have abnormality in the left leg is referred to [place] for further evaluation.  He does not have any claudication type symptoms.  He does not have rest pain or tissue loss.  He does have a history of a demyelinating disease and had prolonged hospitalization at [facility] in 1999.  He has nearly recovered from this. ? ?Past Medical History:  ?Diagnosis Date  ? Anemia 2012 MCV 76.2 HB 12.3 CR 1.0-1.24  ? FERRITIN 14   ? Demyelinating disease of central nervous system ([place])   ? ED (erectile dysfunction)   ? Elevated PSA   ? GERD (gastroesophageal reflux disease)   ? Hypertension   ? Nicotine addiction   ? ? ?Family History  ?Problem Relation Age of Onset  ? Hypertension Mother   ? Cancer Mother 11  ?     metastatized to liver, died at age 38  ? Hypertension Sister   ? Hypertension Brother   ? Hypertension Sister   ? Hypertension Sister   ? Stroke Sister   ? Hypertension Sister   ? Heart disease Sister 23  ?     stents in 4 valves, ruptd aorta  ? Hypertension Brother   ? Colon cancer Neg Hx   ? ? ?SOCIAL HISTORY: ?Social History  ? ?Socioeconomic History  ? Marital status: Single  ?  Spouse name: Not on file  ? Number of children: 0  ? Years of education: 52  ? Highest education level: 12th grade  ?Occupational History  ?  Employer: UNEMPLOYED  ?Tobacco Use  ? Smoking status: Every Day  ?  Packs/day: 0.25  ?  Years: 20.00  ?  Pack years: 5.00  ?  Types: Cigarettes  ? Smokeless tobacco: Never  ? Tobacco comments:  ?     ?Vaping Use  ? Vaping Use: Never used  ?Substance and Sexual Activity  ?  Alcohol use: No  ?  Alcohol/week: 0.0 standard drinks  ? Drug use: No  ? Sexual activity: Yes  ?Other Topics Concern  ? Not on file  ?Social History Narrative  ? Lives with sister and aunt. Aunt passed away two weeks ago   ? ?Social Determinants of Health  ? ?Financial Resource Strain: Low Risk   ? Difficulty of Paying Living Expenses: Not hard at all  ?Food Insecurity: No Food Insecurity  ? Worried About Charity fundraiser in the Last Year: Never true  ? Ran Out of Food in the Last Year: Never true  ?Transportation Needs: No Transportation Needs  ? Lack of Transportation (Medical): No  ? Lack of Transportation (Non-Medical): No  ?Physical Activity: Sufficiently Active  ? Days of Exercise per Week: 7 days  ? Minutes of Exercise per Session: 60 min  ?Stress: Not on file  ?Social Connections: Moderately Isolated  ? Frequency of Communication with Friends and Family: More than three times a week  ? Frequency of Social Gatherings with Friends and Family: More than three times a week  ? Attends Religious Services: More than 4 times per year  ? Active Member of Clubs or Organizations: No  ? Attends  Club or Organization Meetings: Never  ? Marital Status: Never married  ?Intimate Partner Violence: Not on file  ? ? ?No Known Allergies ? ?Current Outpatient Medications  ?Medication Sig Dispense Refill  ? atorvastatin (LIPITOR) 40 MG tablet TAKE 1 TABLET(40 MG) BY MOUTH DAILY 90 tablet 3  ? Cholecalciferol (VITAMIN D3) 250 MCG (10000 UT) TABS Take 10,000 Units by mouth daily at 6 (six) AM.    ? diltiazem (TIAZAC) 360 MG 24 hr capsule TAKE 1 CAPSULE BY MOUTH EVERY DAY 90 capsule 0  ? sildenafil (REVATIO) 20 MG tablet sildenafil 20mg  1 to 5 tablets by mouth daily as needed for erectile dysfunction. 30 tablet 5  ? spironolactone (ALDACTONE) 25 MG tablet Take 1 tablet (25 mg total) by mouth daily. 90 tablet 3  ? ?No current facility-administered medications for this visit.  ? ? ?REVIEW OF SYSTEMS:  ?[X]  denotes positive finding,  [ ]  denotes negative finding ?Cardiac  Comments:  ?Chest pain or chest pressure:    ?Shortness of breath upon exertion:    ?Short of breath when lying flat:    ?Irregular heart rhythm:    ?    ?Vascular    ?Pain in calf, thigh, or hip brought on by ambulation:    ?Pain in feet at night that wakes you up from your sleep:     ?Blood clot in your veins:    ?Leg swelling:     ?    ?Pulmonary    ?Oxygen at home:    ?Productive cough:     ?Wheezing:     ?    ?Neurologic    ?Sudden weakness in arms or legs:     ?Sudden numbness in arms or legs:     ?Sudden onset of difficulty speaking or slurred speech:    ?Temporary loss of vision in one eye:     ?Problems with dizziness:     ?    ?Gastrointestinal    ?Blood in stool:     ?Vomited blood:     ?    ?Genitourinary    ?Burning when urinating:     ?Blood in urine:    ?    ?Psychiatric    ?Major depression:     ?    ?Hematologic    ?Bleeding problems:    ?Problems with blood clotting too easily:    ?    ?Skin    ?Rashes or ulcers:    ?    ?Constitutional    ?Fever or chills:    ? ? ?PHYSICAL EXAM: ?Vitals:  ? 08/10/21 1444  ?BP: (!) 147/76  ?Pulse: 62  ?Resp: 16  ?Temp: 98.1 ?F (36.7 ?C)  ?TempSrc: Temporal  ?SpO2: 98%  ?Weight: 160 lb 6.4 oz (72.8 kg)  ?Height: 5\' 8"  (1.727 m)  ? ? ?GENERAL: The patient is a well-nourished male, in no acute distress. The vital signs are documented above. ?CARDIOVASCULAR: 2+ radial pulses bilaterally.  2+ femoral pulses bilaterally.  I do not palpate a left popliteal pulse.  He does have 1-2+ right popliteal pulse.  He has a 2+ dorsalis pedis pulse on the right and a 1+ dorsalis pedis pulse on the left ?PULMONARY: There is good air exchange  ?MUSCULOSKELETAL: There are no major deformities or cyanosis. ?NEUROLOGIC: No focal weakness or paresthesias are detected. ?SKIN: There are no ulcers or rashes noted. ?PSYCHIATRIC: The patient has a normal affect. ? ?DATA:  ?Noninvasive studies in our office today revealed normal ankle arm index in the  right and moderately diminished at 0.61 on the left ? ?MEDICAL ISSUES: ?I discussed these findings at length with the patient.  He does not have any symptoms of claudication or arterial rest pain.  He has no arterial tissue loss.  I have instructed him to continue his walking program which she is currently quite active with.  Did discuss the relationship between his cigarette smoking and progression of arterial disease.  He will keep a close eye on his feet and notify should he develop any ulceration that will not heal.  Otherwise we will see him on an as-needed basis ? ? ?Rosetta Posner, MD FACS ?[facility] [place] ?Office Tel [phone] ?Pager [phone] ? ?Note: Portions of this report may have been transcribed using voice recognition software.  Every effort has been made to ensure accuracy; however, inadvertent computerized transcription errors may still be present. ? ?

## 2021-08-29 ENCOUNTER — Ambulatory Visit (INDEPENDENT_AMBULATORY_CARE_PROVIDER_SITE_OTHER): Payer: Medicare Other | Admitting: Family Medicine

## 2021-08-29 ENCOUNTER — Other Ambulatory Visit: Payer: Self-pay

## 2021-08-29 DIAGNOSIS — Z0000 Encounter for general adult medical examination without abnormal findings: Secondary | ICD-10-CM | POA: Diagnosis not present

## 2021-08-29 NOTE — Patient Instructions (Signed)
?  Craig Shaffer , ?Thank you for taking time to come for your Medicare Wellness Visit. I appreciate your ongoing commitment to your health goals. Please review the following plan we discussed and let me know if I can assist you in the future.  ? ?These are the goals we discussed: ? Goals   ? ?  DIET - DECREASE SODA OR JUICE INTAKE   ?  Drinks 1 liter out of a whole week  ?  ?  Quit smoking / using tobacco   ?  Still a 1/4 pack a day. Still working on reducing even more ?  ? ?  ?  ?This is a list of the screening recommended for you and due dates:  ?Health Maintenance  ?Topic Date Due  ? Zoster (Shingles) Vaccine (1 of 2) Never done  ? COVID-19 Vaccine (4 - Booster for Pfizer series) 10/06/2020  ? Flu Shot  09/09/2021*  ? Tetanus Vaccine  02/25/2031  ? Colon Cancer Screening  08/06/2031  ? Hepatitis C Screening: USPSTF Recommendation to screen - Ages 31-79 yo.  Completed  ? HIV Screening  Completed  ? HPV Vaccine  Aged Out  ?*Topic was postponed. The date shown is not the original due date.  ?  ?

## 2021-08-29 NOTE — Progress Notes (Signed)
? ?Subjective:  ? Craig Shaffer is a 61 y.o. male who presents for Medicare Annual/Subsequent preventive examination. ? ?Review of Systems    ?I connected with  Craig Shaffer on 08/29/21 by a audio enabled telemedicine application and verified that I am speaking with the correct person using two identifiers. ? ?Patient Location: Home ? ?Provider Location: Office/Clinic ? ?I discussed the limitations of evaluation and management by telemedicine. The patient expressed understanding and agreed to proceed.  ?Cardiac Risk Factors include: advanced age (>110mn, >>80women);hypertension;male gender;smoking/ tobacco exposure ? ?   ?Objective:  ?  ?There were no vitals filed for this visit. ?There is no height or weight on file to calculate BMI. ? ?Advanced Directives 08/29/2021 08/05/2021 06/17/2018 07/18/2017 06/22/2016 05/11/2016 05/11/2016  ?Does Patient Have a Medical Advance Directive? No No No No No - Yes  ?Does patient want to make changes to medical advance directive? - - - - No - Patient declined - -  ?Would patient like information on creating a medical advance directive? No - Patient declined No - Patient declined No - Patient declined No - Patient declined - No - Patient declined -  ? ? ?Current Medications (verified) ?Outpatient Encounter Medications as of 08/29/2021  ?Medication Sig  ? atorvastatin (LIPITOR) 40 MG tablet TAKE 1 TABLET(40 MG) BY MOUTH DAILY  ? Cholecalciferol (VITAMIN D3) 250 MCG (10000 UT) TABS Take 10,000 Units by mouth daily at 6 (six) AM.  ? diltiazem (TIAZAC) 360 MG 24 hr capsule TAKE 1 CAPSULE BY MOUTH EVERY DAY  ? sildenafil (REVATIO) 20 MG tablet sildenafil '20mg'$  1 to 5 tablets by mouth daily as needed for erectile dysfunction.  ? spironolactone (ALDACTONE) 25 MG tablet Take 1 tablet (25 mg total) by mouth daily.  ? ?No facility-administered encounter medications on file as of 08/29/2021.  ? ? ?Allergies (verified) ?Patient has no known allergies.  ? ?History: ?Past Medical History:   ?Diagnosis Date  ? Anemia 2012 MCV 76.2 HB 12.3 CR 1.0-1.24  ? FERRITIN 14   ? 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 ED (erectile dysfunction)   ? Elevated PSA   ? GERD (gastroesophageal reflux disease)   ? Hypertension   ? Nicotine addiction   ? ?Past Surgical History:  ?Procedure Laterality Date  ? APPENDECTOMY  04/2016  ? BIOPSY  08/05/2021  ? Procedure: BIOPSY;  Surgeon: CEloise Harman DO;  [facility];  Service: Endoscopy;;  ? CHOLECYSTECTOMY  5/07  ? COLONOSCOPY  2002 DR STamala Julian ? [place] POLYP  ? COLONOSCOPY  JAN 2013 [facility] ANEMIA  ? H POLYP, IH  ? COLONOSCOPY WITH PROPOFOL N/A 08/05/2021  ? Procedure: COLONOSCOPY WITH PROPOFOL;  Surgeon: CEloise Harman DO;  Location: [facility];  Service: Endoscopy;  Laterality: N/A;  10:30 / ASA 2  ? GIVENS CAPSULE STUDY  10/05/2011  ? Procedure: GIVENS CAPSULE STUDY;  Surgeon: SDanie Binder MD;  Location: [facility];  Service: Endoscopy;  Laterality: N/A;  ? LAPAROSCOPIC APPENDECTOMY N/A 05/11/2016  ? Procedure: APPENDECTOMY LAPAROSCOPIC;  Surgeon: MAviva Signs MD;  Location: [facility];  Service: General;  Laterality: N/A;  ? POLYPECTOMY  08/05/2021  ? Procedure: POLYPECTOMY;  Surgeon: CEloise Harman DO;  [facility];  Service: Endoscopy;;  ? UPPER GASTROINTESTINAL ENDOSCOPY  JAN 2013 ANEMIA  ? GASTRITIS, NL DUO Bx  ? ?Family History  ?Problem Relation Age of Onset  ? Hypertension Mother   ? Cancer Mother 842 ?  metastatized to liver, died at age 91  ? Hypertension Sister   ? Hypertension Brother   ? Hypertension Sister   ? Hypertension Sister   ? Stroke Sister   ? Hypertension Sister   ? Heart disease Sister 37  ?     stents in 4 valves, ruptd aorta  ? Hypertension Brother   ? Colon cancer Neg Hx   ? ?Social History  ? ?Socioeconomic History  ? Marital status: Single  ?  Spouse name: Not on file  ? Number of children: 0  ? Years of education: 31  ? Highest education level: 12th grade  ?Occupational History  ?   Employer: UNEMPLOYED  ?Tobacco Use  ? Smoking status: Every Day  ?  Packs/day: 0.25  ?  Years: 20.00  ?  Pack years: 5.00  ?  Types: Cigarettes  ? Smokeless tobacco: Never  ? Tobacco comments:  ?     ?Vaping Use  ? Vaping Use: Never used  ?Substance and Sexual Activity  ? Alcohol use: No  ?  Alcohol/week: 0.0 standard drinks  ? Drug use: No  ? Sexual activity: Yes  ?Other Topics Concern  ? Not on file  ?Social History Narrative  ? Lives with sister and aunt. Aunt passed away two weeks ago   ? ?Social Determinants of Health  ? ?Financial Resource Strain: Low Risk   ? Difficulty of Paying Living Expenses: Not very hard  ?Food Insecurity: No Food Insecurity  ? Worried About Charity fundraiser in the Last Year: Never true  ? Ran Out of Food in the Last Year: Never true  ?Transportation Needs: No Transportation Needs  ? Lack of Transportation (Medical): No  ? Lack of Transportation (Non-Medical): No  ?Physical Activity: Insufficiently Active  ? Days of Exercise per Week: 3 days  ? Minutes of Exercise per Session: 30 min  ?Stress: No Stress Concern Present  ? Feeling of Stress : Not at all  ?Social Connections: Moderately Integrated  ? Frequency of Communication with Friends and Family: More than three times a week  ? Frequency of Social Gatherings with Friends and Family: More than three times a week  ? Attends Religious Services: More than 4 times per year  ? Active Member of Clubs or Organizations: Yes  ? Attends Archivist Meetings: More than 4 times per year  ? Marital Status: Never married  ? ? ?Tobacco Counseling ?Ready to quit: Not Answered ?Counseling given: Not Answered ?Tobacco comments:   ? ? ?Clinical Intake: ? ?Pre-visit preparation completed: No ? ?Pain : No/denies pain ? ?  ? ?Nutritional Status: BMI 25 -29 Overweight ?Nutritional Risks: None ?Diabetes: No ? ?How often do you need to have someone help you when you read instructions, pamphlets, or other written materials from your doctor or  pharmacy?: 1 - Never ?What is the last grade level you completed in school?: 12th ? ?Diabetic?no ? ?Interpreter Needed?: No ? ?  ? ? ?Activities of Daily Living ?In your present state of health, do you have any difficulty performing the following activities: 08/29/2021 08/28/2021  ?Hearing? N N  ?Vision? N N  ?Difficulty concentrating or making decisions? N N  ?Walking or climbing stairs? N N  ?Dressing or bathing? N N  ?Doing errands, shopping? N N  ?Preparing Food and eating ? N N  ?Using the Toilet? N N  ?In the past six months, have you accidently leaked urine? N N  ?Do you have problems with loss  of bowel control? N N  ?Managing your Medications? N N  ?Managing your Finances? N N  ?Housekeeping or managing your Housekeeping? N N  ?Some recent data might be hidden  ? ? ?Patient Care Team: ?Fayrene Helper, MD as PCP - General ?Danie Binder, MD (Inactive) (Gastroenterology) ?Irine Seal, MD as Attending Physician (Urology) ? ?Indicate any recent Medical Services you may have received from other than [facility] providers in the past year (date may be approximate). ? ?   ?Assessment:  ? This is a routine wellness examination for Deontray. ? ?Hearing/Vision screen ?No results found. ? ?Dietary issues and exercise activities discussed: ?Current Exercise Habits: Home exercise routine, Type of exercise: walking, Time (Minutes): 30, Frequency (Times/Week): 3, Weekly Exercise (Minutes/Week): 90, Exercise limited by: None identified ? ? Goals Addressed   ? ?  ?  ?  ?  ? This Visit's Progress  ?  DIET - DECREASE SODA OR JUICE INTAKE   On track  ?  Drinks 1 liter out of a whole week  ?  ?  Quit smoking / using tobacco   On track  ?  Still a 1/4 pack a day. Still working on reducing even more ?  ? ?  ? ?Depression Screen ?PHQ 2/9 Scores 08/29/2021 08/29/2021 04/19/2021 03/02/2021 08/24/2020 08/11/2020 02/11/2020  ?PHQ - 2 Score 0 0 0 0 0 0 0  ?  ?Fall Risk ?Fall Risk  08/29/2021 08/28/2021 04/19/2021 03/02/2021 08/24/2020  ?Falls in the past  year? 0 0 0 0 0  ?Number falls in past yr: 0 0 0 0 0  ?Injury with Fall? 0 0 0 0 0  ?Risk for fall due to : No Fall Risks - - - -  ?Follow up Falls prevention discussed - - - -  ? ? ?FALL RISK PREVENTION PERTAINING T

## 2021-09-20 ENCOUNTER — Encounter: Payer: Self-pay | Admitting: Family Medicine

## 2021-09-20 ENCOUNTER — Ambulatory Visit (INDEPENDENT_AMBULATORY_CARE_PROVIDER_SITE_OTHER): Payer: Medicare Other | Admitting: Family Medicine

## 2021-09-20 VITALS — BP 122/77 | HR 84 | Ht 68.0 in | Wt 160.0 lb

## 2021-09-20 DIAGNOSIS — E559 Vitamin D deficiency, unspecified: Secondary | ICD-10-CM | POA: Diagnosis not present

## 2021-09-20 DIAGNOSIS — I10 Essential (primary) hypertension: Secondary | ICD-10-CM

## 2021-09-20 DIAGNOSIS — Z720 Tobacco use: Secondary | ICD-10-CM

## 2021-09-20 DIAGNOSIS — E785 Hyperlipidemia, unspecified: Secondary | ICD-10-CM | POA: Diagnosis not present

## 2021-09-20 DIAGNOSIS — Z13220 Encounter for screening for lipoid disorders: Secondary | ICD-10-CM | POA: Diagnosis not present

## 2021-09-20 DIAGNOSIS — F17210 Nicotine dependence, cigarettes, uncomplicated: Secondary | ICD-10-CM

## 2021-09-20 HISTORY — DX: Hyperlipidemia, unspecified: E78.5

## 2021-09-20 NOTE — Progress Notes (Signed)
? ?  Craig Shaffer     MRN: [id number]      DOB: 28-Oct-1960 ? ? ?HPI ?[name] is here for follow up and re-evaluation of chronic medical conditions, medication management and review of any available recent lab and radiology data.  ?Preventive health is updated, specifically  Cancer screening and Immunization.   ?Questions or concerns regarding consultations or procedures which the PT has had in the interim are  addressed. ?The PT denies any adverse reactions to current medications since the last visit.  ?There are no new concerns.  ?There are no specific complaints  ? ?ROS ?Denies recent fever or chills. ?Denies sinus pressure, nasal congestion, ear pain or sore throat. ?Denies chest congestion, productive cough or wheezing. ?Denies chest pains, palpitations and leg swelling ?Denies abdominal pain, nausea, vomiting,diarrhea or constipation.   ?Denies dysuria, frequency, hesitancy or incontinence. ?Denies headaches, seizures, numbness, or tingling. ?Denies depression, anxiety or insomnia. ?Denies skin break down or rash. ? ? ?PE ? ?BP 122/77   Pulse 84   Ht '5\' 8"'$  (1.727 m)   Wt 160 lb 0.6 oz (72.6 kg)   SpO2 99%   BMI 24.33 kg/m?  ? ?Patient alert and oriented and in no cardiopulmonary distress. ? ?HEENT: No facial asymmetry, EOMI,     Neck supple . ? ?Chest: Clear to auscultation bilaterally. ? ?CVS: S1, S2 no murmurs, no S3.Regular rate. ? ?ABD: Soft non tender.  ? ?Ext: No edema ? ?MS: Adequate ROM spine, shoulders, hips and knees. ? ?Skin: Intact, no ulcerations or rash noted. ? ?Psych: Good eye contact, normal affect. Memory intact not anxious or depressed appearing. ? ?CNS: CN 2-12 intact, power,  normal throughout.no focal deficits noted. ? ? ?Assessment & Plan ? ?Essential hypertension ?Controlled, no change in medication ?DASH diet and commitment to daily physical activity for a minimum of 30 minutes discussed and encouraged, as a part of hypertension management. ?The importance of attaining a  healthy weight is also discussed. ? ? ?  09/20/2021  ?  9:26 AM 08/10/2021  ?  2:44 PM 08/05/2021  ? 10:11 AM 08/05/2021  ? 10:09 AM 08/05/2021  ?  9:18 AM 07/28/2021  ?  1:50 PM 04/21/2021  ?  9:18 AM  ?BP/Weight  ?Systolic BP 481 856 314 90 142  134  ?Diastolic BP 77 76 58 57 84  77  ?Wt. (Lbs) 160.04 160.4    162.4   ?BMI 24.33 kg/m2 24.39 kg/m2    24.69 kg/m2   ? ? ? ? ? ?Tobacco abuse ?Asked:confirms currently smokes cigarettes 3/day ?Assess: Unwilling to set a quit date, but is cutting back ?Advise: needs to QUIT to reduce risk of cancer, cardio and cerebrovascular disease ?Assist: counseled for 5 minutes and literature provided ?Arrange: follow up in 2 to 4 months ? ? ?Hyperlipidemia LDL goal <100 ?Hyperlipidemia:Low fat diet discussed and encouraged. ? ? ?Lipid Panel  ?Lab Results  ?Component Value Date  ? CHOL 121 08/16/2020  ? HDL 49 08/16/2020  ? [place] 57 08/16/2020  ? TRIG 73 08/16/2020  ? CHOLHDL 2.5 08/16/2020  ? ? ? ?Updated lab needed ? ?

## 2021-09-20 NOTE — Assessment & Plan Note (Signed)
Asked:confirms currently smokes cigarettes 3/day ?Assess: Unwilling to set a quit date, but is cutting back ?Advise: needs to QUIT to reduce risk of cancer, cardio and cerebrovascular disease ?Assist: counseled for 5 minutes and literature provided ?Arrange: follow up in 2 to 4 months ? ?

## 2021-09-20 NOTE — Assessment & Plan Note (Signed)
Controlled, no change in medication ?DASH diet and commitment to daily physical activity for a minimum of 30 minutes discussed and encouraged, as a part of hypertension management. ?The importance of attaining a healthy weight is also discussed. ? ? ?  09/20/2021  ?  9:26 AM 08/10/2021  ?  2:44 PM 08/05/2021  ? 10:11 AM 08/05/2021  ? 10:09 AM 08/05/2021  ?  9:18 AM 07/28/2021  ?  1:50 PM 04/21/2021  ?  9:18 AM  ?BP/Weight  ?Systolic BP 197 588 325 90 142  134  ?Diastolic BP 77 76 58 57 84  77  ?Wt. (Lbs) 160.04 160.4    162.4   ?BMI 24.33 kg/m2 24.39 kg/m2    24.69 kg/m2   ? ? ? ? ?

## 2021-09-20 NOTE — Assessment & Plan Note (Signed)
Hyperlipidemia:Low fat diet discussed and encouraged. ? ? ?Lipid Panel  ?Lab Results  ?Component Value Date  ? CHOL 121 08/16/2020  ? HDL 49 08/16/2020  ? [place] 57 08/16/2020  ? TRIG 73 08/16/2020  ? CHOLHDL 2.5 08/16/2020  ? ? ? ?Updated lab needed ?

## 2021-09-20 NOTE — Patient Instructions (Addendum)
Annual exam 9/22 or after, call if you need me sooner, flu vaccine at visit ? ?PLEASE get shingrix vaccines at your pharmacy, shingrix and covid booster ? ?Labs needed, cBC, lipid, cmp and eGFr, TSH and vit D today ? ?PLEASE seriously think about and try to commit to stopping  smoking, maybe by your birthday!! ? ? ?Thanks for choosing [facility], we consider it a privelige to serve you. ? ? ? ?

## 2021-09-22 ENCOUNTER — Other Ambulatory Visit: Payer: Self-pay | Admitting: Family Medicine

## 2021-09-22 LAB — CMP14+EGFR
ALT: 19 IU/L (ref 0–44)
AST: 24 IU/L (ref 0–40)
Albumin/Globulin Ratio: 1.5 (ref 1.2–2.2)
Albumin: 4.5 g/dL (ref 3.8–4.8)
Alkaline Phosphatase: 86 IU/L (ref 44–121)
BUN/Creatinine Ratio: 13 (ref 10–24)
BUN: 17 mg/dL (ref 8–27)
Bilirubin Total: 0.3 mg/dL (ref 0.0–1.2)
CO2: 22 mmol/L (ref 20–29)
Calcium: 10.1 mg/dL (ref 8.6–10.2)
Chloride: 99 mmol/L (ref 96–106)
Creatinine, Ser: 1.31 mg/dL — ABNORMAL HIGH (ref 0.76–1.27)
Globulin, Total: 3.1 g/dL (ref 1.5–4.5)
Glucose: 63 mg/dL — ABNORMAL LOW (ref 70–99)
Potassium: 4.5 mmol/L (ref 3.5–5.2)
Sodium: 135 mmol/L (ref 134–144)
Total Protein: 7.6 g/dL (ref 6.0–8.5)
eGFR: 62 mL/min/{1.73_m2} (ref >59)

## 2021-09-22 LAB — LIPID PANEL
Chol/HDL Ratio: 2.5 ratio (ref 0.0–5.0)
Cholesterol, Total: 119 mg/dL (ref 100–199)
HDL: 47 mg/dL (ref >39)
LDL Chol Calc (NIH): 52 mg/dL (ref 0–99)
Triglycerides: 111 mg/dL (ref 0–149)
VLDL Cholesterol Cal: 20 mg/dL (ref 5–40)

## 2021-09-22 LAB — CBC
Hematocrit: 38 % (ref 37.5–51.0)
Hemoglobin: 12.8 g/dL — ABNORMAL LOW (ref 13.0–17.7)
MCH: 30.4 pg (ref 26.6–33.0)
MCHC: 33.7 g/dL (ref 31.5–35.7)
MCV: 90 fL (ref 79–97)
Platelets: 242 10*3/uL (ref 150–450)
RBC: 4.21 x10E6/uL (ref 4.14–5.80)
RDW: 13.2 % (ref 11.6–15.4)
WBC: 5.2 10*3/uL (ref 3.4–10.8)

## 2021-09-22 LAB — VITAMIN D 25 HYDROXY (VIT D DEFICIENCY, FRACTURES): Vit D, 25-Hydroxy: 131 ng/mL — ABNORMAL HIGH (ref 30.0–100.0)

## 2021-09-22 LAB — TSH: TSH: 1.43 u[IU]/mL (ref 0.450–4.500)

## 2021-12-16 ENCOUNTER — Other Ambulatory Visit: Payer: Self-pay | Admitting: Family Medicine

## 2021-12-21 ENCOUNTER — Other Ambulatory Visit: Payer: Self-pay | Admitting: Family Medicine

## 2022-03-07 ENCOUNTER — Encounter: Payer: Medicare Other | Admitting: Family Medicine

## 2022-03-08 ENCOUNTER — Encounter: Payer: Self-pay | Admitting: Family Medicine

## 2022-03-08 ENCOUNTER — Ambulatory Visit (INDEPENDENT_AMBULATORY_CARE_PROVIDER_SITE_OTHER): Payer: Medicare Other | Admitting: Family Medicine

## 2022-03-08 VITALS — BP 130/72 | HR 73 | Ht 68.0 in | Wt 160.0 lb

## 2022-03-08 DIAGNOSIS — Z0001 Encounter for general adult medical examination with abnormal findings: Secondary | ICD-10-CM

## 2022-03-08 DIAGNOSIS — I10 Essential (primary) hypertension: Secondary | ICD-10-CM | POA: Diagnosis not present

## 2022-03-08 DIAGNOSIS — Z23 Encounter for immunization: Secondary | ICD-10-CM

## 2022-03-08 DIAGNOSIS — Z0000 Encounter for general adult medical examination without abnormal findings: Secondary | ICD-10-CM

## 2022-03-08 DIAGNOSIS — E785 Hyperlipidemia, unspecified: Secondary | ICD-10-CM | POA: Diagnosis not present

## 2022-03-08 MED ORDER — VITAMIN D3 25 MCG (1000 UT) PO CAPS: 1000.0000 [IU] | ORAL_CAPSULE | Freq: Every day | ORAL | 1 refills | Status: AC

## 2022-03-08 NOTE — Progress Notes (Unsigned)
   Craig Shaffer     MRN: [id number]      DOB: 01-13-1961   HPI: Patient is in for annual physical exam. No other health concerns are expressed or addressed at the visit. Recent labs, if available are reviewed. Immunization is reviewed , and  updated if needed.    PE; Pleasant male, alert and oriented x 3, in no cardio-pulmonary distress. Afebrile. HEENT No facial trauma or asymetry. Sinuses non tender. EOMI External ears normal,  Neck: supple, no adenopathy,JVD or thyromegaly.No bruits.  Chest: Clear to ascultation bilaterally.No crackles or wheezes. Non tender to palpation  Cardiovascular system; Heart sounds normal,  S1 and  S2 ,no S3.  No murmur, or thrill. Apical beat not displaced Peripheral pulses normal.  Abdomen: Soft, non tender, no organomegaly or masses. No bruits. Bowel sounds normal. No guarding, tenderness or rebound.    Musculoskeletal exam: Full ROM of spine, hips , shoulders and knees. No deformity ,swelling or crepitus noted. No muscle wasting or atrophy.   Neurologic: Cranial nerves 2 to 12 intact. Power, tone ,sensation and reflexes normal throughout. No disturbance in gait. No tremor.  Skin: Intact, no ulceration, erythema , scaling or rash noted. Pigmentation normal throughout  Psych; Normal mood and affect. Judgement and concentration normal   Assessment & Plan:  No problem-specific Assessment & Plan notes found for this encounter.

## 2022-03-08 NOTE — Progress Notes (Unsigned)
   Craig Shaffer     MRN: [id number]      DOB: [birth year]   HPI: Patient is in for annual physical exam. No other health concerns are expressed or addressed at the visit. Recent labs, if available are reviewed. Immunization is reviewed , and  updated if needed.    PE; Pleasant male, alert and oriented x 3, in no cardio-pulmonary distress. Afebrile. HEENT No facial trauma or asymetry. Sinuses non tender. EOMI External ears normal,  Neck: supple, no adenopathy,JVD or thyromegaly.No bruits.  Chest: Clear to ascultation bilaterally.No crackles or wheezes. Non tender to palpation  Cardiovascular system; Heart sounds normal,  S1 and  S2 ,no S3.  No murmur, or thrill. Apical beat not displaced Peripheral pulses normal.  Abdomen: Soft, non tender, no organomegaly or masses. No bruits. Bowel sounds normal. No guarding, tenderness or rebound.    Musculoskeletal exam: Full ROM of spine, hips , shoulders and knees. No deformity ,swelling or crepitus noted. No muscle wasting or atrophy.   Neurologic: Cranial nerves 2 to 12 intact. Power, tone ,sensation and reflexes normal throughout. No disturbance in gait. No tremor.  Skin: Intact, no ulceration, erythema , scaling or rash noted. Pigmentation normal throughout  Psych; Normal mood and affect. Judgement and concentration normal   Assessment & Plan:  No problem-specific Assessment & Plan notes found for this encounter.

## 2022-03-08 NOTE — Patient Instructions (Addendum)
F/u in 6 months, call if you need me sooner  Flu vaccine today  Fasting lipid, cmp and eGFR NOV 3 OR  SHORTLY AFTER  START VIT D3, 1000 IU ONCE DAILY, THIS IS OTC  Thanks for choosing [facility], we consider it a privelige to serve you.

## 2022-03-09 ENCOUNTER — Encounter: Payer: Self-pay | Admitting: Family Medicine

## 2022-03-09 DIAGNOSIS — Z0000 Encounter for general adult medical examination without abnormal findings: Secondary | ICD-10-CM | POA: Insufficient documentation

## 2022-03-09 NOTE — Assessment & Plan Note (Signed)

## 2022-03-21 ENCOUNTER — Other Ambulatory Visit: Payer: Self-pay | Admitting: Family Medicine

## 2022-04-09 ENCOUNTER — Encounter (HOSPITAL_COMMUNITY): Payer: Self-pay | Admitting: Emergency Medicine

## 2022-04-09 ENCOUNTER — Other Ambulatory Visit: Payer: Self-pay

## 2022-04-09 ENCOUNTER — Emergency Department (HOSPITAL_COMMUNITY)
Admission: EM | Admit: 2022-04-09 | Discharge: 2022-04-09 | Disposition: A | Discharge status: Home / Self Care | Payer: Medicare Other | Attending: Emergency Medicine | Admitting: Emergency Medicine

## 2022-04-09 DIAGNOSIS — N6342 Unspecified lump in left breast, subareolar: Secondary | ICD-10-CM | POA: Diagnosis not present

## 2022-04-09 DIAGNOSIS — N6320 Unspecified lump in the left breast, unspecified quadrant: Secondary | ICD-10-CM | POA: Diagnosis not present

## 2022-04-09 NOTE — Discharge Instructions (Addendum)
Ultimately, given the duration of symptoms of 3 to 4 weeks, this is likely more of a fibroadenoma.  It is painless decreasing likelihood of abscess or infection.  This does need further evaluation, close monitoring and likely ultrasonographic evaluation which is not available at this time in the emergency department.  Fortunately, you are already scheduled follow-up with his PCP tomorrow.

## 2022-04-09 NOTE — ED Triage Notes (Signed)
Pt c/o lump on L chest x several weeks ago that has gotten bigger. Pt denies of any pain unless he pushed down on the lump and then states he feels "a little stinging but it doesn't last"

## 2022-04-09 NOTE — ED Provider Notes (Signed)
[facility] EMERGENCY DEPARTMENT Provider Note   CSN: [id number] Arrival date & time: 04/09/22  3546     History Chief Complaint  Patient presents with   Mass    HPI Craig Shaffer is a 61 y.o. male presenting for painless lump on his left breast.  He states has been present for approximately 4 weeks.  He has follow-up with his primary care provider tomorrow.  He denies fevers or chills, nausea vomiting, syncope shortness of breath.  It is mobile.  He states that he was aware he was can get sent to the emergency department tomorrow so he wanted to get seen today to make sure that he could wait to be seen tomorrow..   Patient's recorded medical, surgical, social, medication list and allergies were reviewed in the Snapshot window as part of the initial history.   Review of Systems   Review of Systems  Constitutional:  Negative for chills and fever.  HENT:  Negative for ear pain and sore throat.   Eyes:  Negative for pain and visual disturbance.  Respiratory:  Negative for cough and shortness of breath.   Cardiovascular:  Negative for chest pain and palpitations.  Gastrointestinal:  Negative for abdominal pain and vomiting.  Genitourinary:  Negative for dysuria and hematuria.  Musculoskeletal:  Negative for arthralgias and back pain.  Skin:  Negative for color change and rash.  Neurological:  Negative for seizures and syncope.  All other systems reviewed and are negative.   Physical Exam Updated Vital Signs BP (!) 140/81 (BP Location: Right Arm)   Pulse 70   Temp 98.1 F (36.7 C) (Oral)   Resp 19   Ht '5\' 8"'$  (1.727 m)   Wt 72.6 kg   SpO2 98%   BMI 24.33 kg/m  Physical Exam Vitals and nursing note reviewed.  Constitutional:      General: He is not in acute distress.    Appearance: He is well-developed.  HENT:     Head: Normocephalic and atraumatic.  Eyes:     Conjunctiva/sclera: Conjunctivae normal.  Cardiovascular:     Rate and Rhythm: Normal rate and regular  rhythm.     Heart sounds: No murmur heard. Pulmonary:     Effort: Pulmonary effort is normal. No respiratory distress.     Breath sounds: Normal breath sounds.  Abdominal:     Palpations: Abdomen is soft.     Tenderness: There is no abdominal tenderness.  Musculoskeletal:        General: Deformity (2 cm mobile soft tissue mass appreciated on left breast.  No nipple drainage.  No peau d'orange appreciated of the nipple.  It is painless.  No overlying erythema or redness.) present. No swelling.     Cervical back: Neck supple.  Skin:    General: Skin is warm and dry.     Capillary Refill: Capillary refill takes less than 2 seconds.  Neurological:     Mental Status: He is alert.  Psychiatric:        Mood and Affect: Mood normal.      ED Course/ Medical Decision Making/ A&P    Procedures Procedures   Medications Ordered in ED Medications - No data to display  Medical Decision Making:    Craig Shaffer is a 61 y.o. male who presented to the ED today with soft tissue mass detailed above.     Patient placed on continuous vitals and telemetry monitoring while in ED which was reviewed periodically.   Complete initial  physical exam performed, notably the patient  was hemodynamically stable in no acute distress.      Reviewed and confirmed nursing documentation for past medical history, family history, social history.    Initial Assessment:   With the patient's presentation of breast mass, most likely diagnosis is fibroadenoma. Other diagnoses were considered including (but not limited to) developing cancer. These are considered less likely due to history of present illness and physical exam findings.   This is most consistent with an acute life/limb threatening illness complicated by underlying chronic conditions.  Initial Plan:  Had a prolonged conversation with patient.  Ultimately, given the duration of symptoms of 3 to 4 weeks, this is likely more of a fibroadenoma.  It is  painless decreasing likelihood of abscess or infection.  Patient does need further evaluation, close monitoring and likely ultrasonographic evaluation which is not available at this time in the emergency department.  Fortunately, patient is already scheduled follow-up with his PCP tomorrow.  No acute indication for further intervention in the emergency department and patient stable for continued outpatient care management.  Patient discharged with no further acute events.  Clinical Impression:  1. Subareolar mass of left breast      Discharge   Final Clinical Impression(s) / ED Diagnoses Final diagnoses:  Subareolar mass of left breast    Rx / DC Orders ED Discharge Orders     None         Tretha Sciara, MD 04/09/22 [id number]

## 2022-04-10 ENCOUNTER — Ambulatory Visit (INDEPENDENT_AMBULATORY_CARE_PROVIDER_SITE_OTHER): Payer: Medicare Other | Admitting: Internal Medicine

## 2022-04-10 ENCOUNTER — Encounter: Payer: Self-pay | Admitting: Internal Medicine

## 2022-04-10 DIAGNOSIS — T50905A Adverse effect of unspecified drugs, medicaments and biological substances, initial encounter: Secondary | ICD-10-CM | POA: Diagnosis not present

## 2022-04-10 DIAGNOSIS — N62 Hypertrophy of breast: Secondary | ICD-10-CM | POA: Diagnosis not present

## 2022-04-10 HISTORY — DX: Adverse effect of unspecified drugs, medicaments and biological substances, initial encounter: T50.905A

## 2022-04-10 NOTE — Assessment & Plan Note (Signed)
Patient presents with bilateral breast enlargement. He noticed 2 weeks ago some breast enlargement, but left breast became even larger over the weekend. He went to the ED. He was sent here for follow up and evaluation. No tenderness at rest. No drainage. No perosonal history of cancer. His niece had breast cancer at age 63.  Assessment/Plan: Drug induced gynecomastia. Will stop spirolactone today and have patient to follow up in 2 weeks for evaluation. Will adjust BP medications at this visit if needed.

## 2022-04-10 NOTE — Patient Instructions (Signed)
Thank you for trusting me with your care. To recap, today we discussed the following:   Gynecomastia ( Breast enlargement)  - I believe this is from the Spirolactone . Stop this medication. Follow up in 2 weeks for BP check and to see if your symptoms are improved.

## 2022-04-10 NOTE — Progress Notes (Signed)
     CC: breast lump  HPI:Craig Shaffer is a 61 y.o. male who presents for evaluation of breast lump. For the details of today's visit, please refer to the assessment and plan.  Past Medical History:  Diagnosis Date   Anemia 2012 MCV 76.2 HB 12.3 CR 1.0-1.24   FERRITIN 14    Demyelinating disease of central nervous system (HCC)    ED (erectile dysfunction)    Elevated PSA    GERD (gastroesophageal reflux disease)    Hyperlipidemia LDL goal <100 09/20/2021   Hypertension    Nicotine addiction     Review of Systems:    Review of Systems  Constitutional:  Negative for chills, fever, malaise/fatigue and weight loss.  Musculoskeletal:  Negative for back pain and joint pain.     Physical Exam: Vitals:   04/10/22 1454  BP: 119/69  Pulse: 68  Resp: 16  SpO2: 96%  Weight: 161 lb (73 kg)  Height: '5\' 8"'$  (1.727 m)     Physical Exam Constitutional:      General: Craig Shaffer is not in acute distress.    Appearance: Normal appearance. Craig Shaffer is normal weight. Craig Shaffer is not ill-appearing.  Cardiovascular:     Rate and Rhythm: Normal rate and regular rhythm.  Chest:  Breasts:    Right: Swelling and tenderness present. No inverted nipple, nipple discharge or skin change.     Left: Swelling and tenderness present. No inverted nipple, nipple discharge or skin change.  Lymphadenopathy:     Upper Body:     Right upper body: No supraclavicular, axillary or pectoral adenopathy.     Left upper body: No supraclavicular, axillary or pectoral adenopathy.      Assessment & Plan:   Drug-induced gynecomastia Patient presents with bilateral breast enlargement. Craig Shaffer noticed 2 weeks ago some breast enlargement, but left breast became even larger over the weekend. Craig Shaffer went to the ED. Craig Shaffer was sent here for follow up and evaluation. No tenderness at rest. No drainage. No perosonal history of cancer. His niece had breast cancer at age 60.  Assessment/Plan: Drug induced gynecomastia. Will stop spirolactone  today and have patient to follow up in 2 weeks for evaluation. Will adjust BP medications at this visit if needed.    [name], MD

## 2022-04-14 DIAGNOSIS — E785 Hyperlipidemia, unspecified: Secondary | ICD-10-CM | POA: Diagnosis not present

## 2022-04-14 DIAGNOSIS — I10 Essential (primary) hypertension: Secondary | ICD-10-CM | POA: Diagnosis not present

## 2022-04-15 LAB — LIPID PANEL
Chol/HDL Ratio: 2.4 ratio (ref 0.0–5.0)
Cholesterol, Total: 118 mg/dL (ref 100–199)
HDL: 49 mg/dL (ref >39)
LDL Chol Calc (NIH): 53 mg/dL (ref 0–99)
Triglycerides: 80 mg/dL (ref 0–149)
VLDL Cholesterol Cal: 16 mg/dL (ref 5–40)

## 2022-04-15 LAB — CMP14+EGFR
ALT: 16 IU/L (ref 0–44)
AST: 19 IU/L (ref 0–40)
Albumin/Globulin Ratio: 1.2 (ref 1.2–2.2)
Albumin: 4.1 g/dL (ref 3.9–4.9)
Alkaline Phosphatase: 94 IU/L (ref 44–121)
BUN/Creatinine Ratio: 12 (ref 10–24)
BUN: 14 mg/dL (ref 8–27)
Bilirubin Total: 0.4 mg/dL (ref 0.0–1.2)
CO2: 25 mmol/L (ref 20–29)
Calcium: 9.3 mg/dL (ref 8.6–10.2)
Chloride: 102 mmol/L (ref 96–106)
Creatinine, Ser: 1.16 mg/dL (ref 0.76–1.27)
Globulin, Total: 3.3 g/dL (ref 1.5–4.5)
Glucose: 81 mg/dL (ref 70–99)
Potassium: 4.7 mmol/L (ref 3.5–5.2)
Sodium: 139 mmol/L (ref 134–144)
Total Protein: 7.4 g/dL (ref 6.0–8.5)
eGFR: 71 mL/min/{1.73_m2} (ref >59)

## 2022-04-24 ENCOUNTER — Encounter: Payer: Self-pay | Admitting: Internal Medicine

## 2022-04-24 ENCOUNTER — Ambulatory Visit (INDEPENDENT_AMBULATORY_CARE_PROVIDER_SITE_OTHER): Payer: Medicare Other | Admitting: Internal Medicine

## 2022-04-24 VITALS — BP 120/70 | HR 73 | Ht 68.0 in | Wt 162.1 lb

## 2022-04-24 DIAGNOSIS — N62 Hypertrophy of breast: Secondary | ICD-10-CM | POA: Diagnosis not present

## 2022-04-24 DIAGNOSIS — I10 Essential (primary) hypertension: Secondary | ICD-10-CM

## 2022-04-24 DIAGNOSIS — T50905A Adverse effect of unspecified drugs, medicaments and biological substances, initial encounter: Secondary | ICD-10-CM

## 2022-04-24 NOTE — Progress Notes (Signed)
     CC: Follow-up (Follow  up)   HPI:[name] is a 61 y.o. male who presents for evaluation of gynecomastia and hypertension. For the details of today's visit, please refer to the assessment and plan.   Past Medical History:  Diagnosis Date   Anemia 2012 MCV 76.2 HB 12.3 CR 1.0-1.24   FERRITIN 14    Demyelinating disease of central nervous system [facility])    ED (erectile dysfunction)    Elevated PSA    GERD (gastroesophageal reflux disease)    Hyperlipidemia LDL goal <100 09/20/2021   Hypertension    Nicotine addiction      Physical Exam: Vitals:   04/24/22 0839  BP: 120/70  Pulse: 73  SpO2: 98%  Weight: 162 lb 1.3 oz (73.5 kg)  Height: '5\' 8"'$  (1.727 m)     Physical Exam Constitutional:      Appearance: Normal appearance. He is normal weight.  Cardiovascular:     Pulses: Normal pulses.     Heart sounds: Normal heart sounds. No murmur heard.    No friction rub. No gallop.  Pulmonary:     Effort: Pulmonary effort is normal.     Breath sounds: Normal breath sounds.  Neurological:     Mental Status: He is alert.      Assessment & Plan:   Drug-induced gynecomastia Patient presents for follow up of bilateral breast enlargement I expect was side effect from spirolactone. Medication was stopped 2 weeks ago. His breast have slightly decreased in size and no longer tender.   Assessment/Plan: Drug induced gynecomastia. Will need to avoid Spirolactone in future. Added to allergy list. Picture placed under media tab from today. Has follow up with PCP in 3 months to continue to follow.    Essential hypertension  Patient's BP today is 120/70 with a goal of <140/80. The patient endorses adherence to Diltiazem 360 mg daily.   Assessment/Plan: Chronic HTN, controlled.  Spirolactone stopped 2 weeks ago for side effect of gynecomastia. Will have office visit in 3 months for continued monitoring. Continue Diltiazem.       Craig Dy, MD

## 2022-04-24 NOTE — Patient Instructions (Addendum)
Thank you for trusting me with your care. To recap, today we discussed the following:   I am glad your chest is improving with stopping medication. Follow up with Dr.Simpson in March to continue monitoring. Your blood pressure is at goal today.

## 2022-04-24 NOTE — Assessment & Plan Note (Signed)
  Patient's BP today is 120/70 with a goal of <140/80. The patient endorses adherence to Diltiazem 360 mg daily.   Assessment/Plan: Chronic HTN, controlled.  Spirolactone stopped 2 weeks ago for side effect of gynecomastia. Will have office visit in 3 months for continued monitoring. Continue Diltiazem.

## 2022-04-24 NOTE — Assessment & Plan Note (Signed)
Patient presents for follow up of bilateral breast enlargement I expect was side effect from spirolactone. Medication was stopped 2 weeks ago. His breast have slightly decreased in size and no longer tender.   Assessment/Plan: Drug induced gynecomastia. Will need to avoid Spirolactone in future. Added to allergy list. Picture placed under media tab from today. Has follow up with PCP in 3 months to continue to follow.

## 2022-06-19 ENCOUNTER — Other Ambulatory Visit: Payer: Self-pay | Admitting: Family Medicine

## 2022-06-22 ENCOUNTER — Other Ambulatory Visit: Payer: Self-pay | Admitting: Family Medicine

## 2022-09-04 ENCOUNTER — Encounter: Payer: Self-pay | Admitting: Internal Medicine

## 2022-09-04 ENCOUNTER — Ambulatory Visit (INDEPENDENT_AMBULATORY_CARE_PROVIDER_SITE_OTHER): Payer: 59 | Admitting: Internal Medicine

## 2022-09-04 DIAGNOSIS — Z0000 Encounter for general adult medical examination without abnormal findings: Secondary | ICD-10-CM

## 2022-09-04 NOTE — Progress Notes (Signed)
Subjective:  This is a telephone encounter between Craig Shaffer and Craig Shaffer on 09/04/2022 for AWV. The visit was conducted with the patient located at home and Craig Shaffer at [facility]. The patient's identity was confirmed using their DOB and current address. The patient has consented to being evaluated through a telephone encounter and understands the associated risks (an examination cannot be done and the patient may need to come in for an appointment) / benefits (allows the patient to remain at home, decreasing exposure to coronavirus).     Craig Shaffer is a 62 y.o. male who presents for Medicare Annual/Subsequent preventive examination.  Review of Systems    Review of Systems  All other systems reviewed and are negative.    Objective:    There were no vitals filed for this visit. There is no height or weight on file to calculate BMI.     09/04/2022    8:42 AM 04/09/2022    7:19 AM 08/29/2021    8:14 AM 08/05/2021    9:06 AM 06/17/2018    8:19 AM 07/18/2017    1:58 PM 06/22/2016    8:12 AM  Advanced Directives  Does Patient Have a Medical Advance Directive? No No No No No No No  Does patient want to make changes to medical advance directive?       No - Patient declined  Would patient like information on creating a medical advance directive? Yes (ED - Information included in AVS) No - Patient declined No - Patient declined No - Patient declined No - Patient declined No - Patient declined     Current Medications (verified) Outpatient Encounter Medications as of 09/04/2022  Medication Sig   atorvastatin (LIPITOR) 40 MG tablet TAKE 1 TABLET(40 MG) BY MOUTH DAILY   Cholecalciferol (VITAMIN D3) 25 MCG (1000 UT) CAPS Take 1 capsule (1,000 Units total) by mouth daily.   diltiazem (TIAZAC) 360 MG 24 hr capsule TAKE 1 CAPSULE BY MOUTH EVERY DAY   No facility-administered encounter medications on file as of 09/04/2022.    Allergies (verified) Spironolactone   History: Past  Medical History:  Diagnosis Date   Anemia 2012 MCV 76.2 HB 12.3 CR 1.0-1.24   FERRITIN 14    Demyelinating disease of central nervous system [facility])    ED (erectile dysfunction)    Elevated PSA    GERD (gastroesophageal reflux disease)    Hyperlipidemia LDL goal <100 09/20/2021   Hypertension    Nicotine addiction    Past Surgical History:  Procedure Laterality Date   APPENDECTOMY  04/2016   BIOPSY  08/05/2021   Procedure: BIOPSY;  Surgeon: Eloise Harman, DO;  [facility];  Service: Endoscopy;;   CHOLECYSTECTOMY  5/07   COLONOSCOPY  2002 [facility] POLYP   COLONOSCOPY  JAN 2013 [facility] ANEMIA   H POLYP, IH   COLONOSCOPY WITH PROPOFOL N/A 08/05/2021   Procedure: COLONOSCOPY WITH PROPOFOL;  Surgeon: Eloise Harman, DO;  Location: [facility];  Service: Endoscopy;  Laterality: N/A;  10:30 / ASA 2   GIVENS CAPSULE STUDY  10/05/2011   Procedure: GIVENS CAPSULE STUDY;  Surgeon: Danie Binder, MD;  Location: [facility];  Service: Endoscopy;  Laterality: N/A;   LAPAROSCOPIC APPENDECTOMY N/A 05/11/2016   Procedure: APPENDECTOMY LAPAROSCOPIC;  Surgeon: Aviva Signs, MD;  Location: [facility] ORS;  Service: General;  Laterality: N/A;   POLYPECTOMY  08/05/2021   Procedure: POLYPECTOMY;  Surgeon: Eloise Harman, DO;  Location: [facility];  Service: Endoscopy;;   UPPER GASTROINTESTINAL ENDOSCOPY  JAN 2013 ANEMIA   GASTRITIS, NL DUO Bx   Family History  Problem Relation Age of Onset   Hypertension Mother    Cancer Mother 39       metastatized to liver, died at age 25   Hypertension Sister    Hypertension Brother    Hypertension Sister    Hypertension Sister    Stroke Sister    Hypertension Sister    Heart disease Sister 67       stents in 4 valves, ruptd aorta   Hypertension Brother    Colon cancer Neg Hx    Social History   Socioeconomic History   Marital status: Single    Spouse name: Not on file   Number of children: 0   Years of education: 12   Highest  education level: 12th grade  Occupational History    Employer: UNEMPLOYED  Tobacco Use   Smoking status: Every Day    Packs/day: 0.50    Years: 46.00    Additional pack years: 0.00    Total pack years: 23.00    Types: Cigarettes   Smokeless tobacco: Never   Tobacco comments:       Vaping Use   Vaping Use: Never used  Substance and Sexual Activity   Alcohol use: No    Alcohol/week: 0.0 standard drinks of alcohol   Drug use: No   Sexual activity: Yes  Other Topics Concern   Not on file  Social History Narrative   Lives with sister and aunt. Aunt passed away two weeks ago    Social Determinants of Health   Financial Resource Strain: Low Risk  (08/29/2021)   Overall Financial Resource Strain (CARDIA)    Difficulty of Paying Living Expenses: Not very hard  Food Insecurity: No Food Insecurity (08/29/2021)   Hunger Vital Sign    Worried About Running Out of Food in the Last Year: Never true    Ran Out of Food in the Last Year: Never true  Transportation Needs: No Transportation Needs (08/29/2021)   PRAPARE - Hydrologist (Medical): No    Lack of Transportation (Non-Medical): No  Physical Activity: Insufficiently Active (08/29/2021)   Exercise Vital Sign    Days of Exercise per Week: 3 days    Minutes of Exercise per Session: 30 min  Stress: No Stress Concern Present (08/29/2021)   [place]    Feeling of Stress : Not at all  Social Connections: Moderately Integrated (08/29/2021)   Social Connection and Isolation Panel [NHANES]    Frequency of Communication with Friends and Family: More than three times a week    Frequency of Social Gatherings with Friends and Family: More than three times a week    Attends Religious Services: More than 4 times per year    Active Member of [employer] or Organizations: Yes    Attends Music therapist: More than 4 times per year    Marital  Status: Never married    Tobacco Counseling Ready to quit: Not Answered Counseling given: Not Answered Tobacco comments:     Clinical Intake:  Pre-visit preparation completed: Yes  Pain : No/denies pain        How often do you need to have someone help you when you read instructions, pamphlets, or other written materials from your doctor or pharmacy?: 1 - Never What is  the last grade level you completed in school?: 12th grade  Diabetic?No         Activities of Daily Living    09/04/2022    8:48 AM 08/31/2022    9:17 AM  In your present state of health, do you have any difficulty performing the following activities:  Hearing? 0 0  Vision? 0 0  Difficulty concentrating or making decisions? 0 0  Walking or climbing stairs? 0 0  Dressing or bathing? 0 0  Doing errands, shopping?  0  Preparing Food and eating ?  N  Using the Toilet?  N  In the past six months, have you accidently leaked urine?  N  Do you have problems with loss of bowel control?  N  Managing your Medications?  N  Managing your Finances?  N  Housekeeping or managing your Housekeeping?  N    Patient Care Team: Fayrene Helper, MD as PCP - [facility], Marga Melnick, MD (Inactive) (Gastroenterology) Irine Seal, MD as Attending Physician (Urology)  Indicate any recent Medical Services you may have received from other than [facility] providers in the past year (date may be approximate).     Assessment:   This is a routine wellness examination for Shamir.  Hearing/Vision screen No results found.  Dietary issues and exercise activities discussed:     Goals Addressed   None    Depression Screen    04/24/2022    8:40 AM 04/10/2022    2:55 PM 03/08/2022    9:49 AM 09/20/2021    9:26 AM 08/29/2021    8:09 AM 08/29/2021    8:04 AM 04/19/2021    4:04 PM  PHQ 2/9 Scores  PHQ - 2 Score 0 0 0 0 0 0 0    Fall Risk    09/04/2022    8:45 AM 08/31/2022    9:17 AM 04/24/2022    8:40 AM 04/10/2022     2:55 PM 03/08/2022    9:49 AM  Fall Risk   Falls in the past year? 0 0 0 0 0  Number falls in past yr:  0 0 0 0  Injury with Fall?  0 0 0 0  Risk for fall due to :   No Fall Risks  No Fall Risks  Follow up   Falls evaluation completed  Falls evaluation completed    FALL RISK PREVENTION PERTAINING TO THE HOME:  Any stairs in or around the home? Yes  If so, are there any without handrails? Yes  Home free of loose throw rugs in walkways, pet beds, electrical cords, etc? Yes  Adequate lighting in your home to reduce risk of falls? Yes   ASSISTIVE DEVICES UTILIZED TO PREVENT FALLS:  Life alert? No  Use of a cane, walker or w/c? No  Grab bars in the bathroom? No  Shower chair or bench in shower? No  Elevated toilet seat or a handicapped toilet? No     Cognitive Function:        09/04/2022    8:47 AM 08/29/2021    8:11 AM 08/24/2020    8:13 AM 08/24/2020    8:12 AM 07/03/2019   10:46 AM  6CIT Screen  What Year? 0 points 0 points 0 points 0 points 0 points  What month? 0 points 0 points 0 points 0 points 0 points  What time? 0 points 0 points 0 points 0 points 0 points  Count back from 20 0 points 0 points 0 points 0  points 0 points  Months in reverse 0 points 0 points 0 points 0 points 0 points  Repeat phrase 0 points 0 points 0 points 0 points 0 points  Total Score 0 points 0 points 0 points 0 points 0 points    Immunizations Immunization History  Administered Date(s) Administered   DTaP 02/24/2021   Influenza,inj,Quad PF,6+ Mos 04/04/2013, 03/16/2014, 03/10/2015, 03/08/2022   PFIZER(Purple Top)SARS-COV-2 Vaccination 12/26/2019, 01/26/2020, 08/11/2020   Td 02/18/2004   Tdap 02/24/2021   Zoster Recombinat (Shingrix) 08/26/2020   Zoster, Unspecified 01/04/2021    TDAP status: Up to date  Flu Vaccine status: Up to date  Pneumococcal vaccine status: Up to date  Covid-19 vaccine status: Information provided on how to obtain vaccines.   Qualifies for Shingles Vaccine?  Yes   Zostavax completed No   Shingrix Completed?: Yes  Screening Tests Health Maintenance  Topic Date Due   COVID-19 Vaccine (4 - 2023-24 season) 02/10/2022   Medicare Annual Wellness (AWV)  09/04/2023   DTaP/Tdap/Td (4 - Td or Tdap) 02/25/2031   COLONOSCOPY (Pts 45-33yrs Insurance coverage will need to be confirmed)  08/06/2031   INFLUENZA VACCINE  Completed   Hepatitis C Screening  Completed   HIV Screening  Completed   Zoster Vaccines- Shingrix  Completed   HPV VACCINES  Aged Out    Health Maintenance  Health Maintenance Due  Topic Date Due   COVID-19 Vaccine (4 - 2023-24 season) 02/10/2022    Colorectal cancer screening: Type of screening: Colonoscopy. Completed 08/05/2021. Repeat every 10 years  Lung Cancer Screening: (Low Dose CT Chest recommended if Age 9-80 years, 30 pack-year currently smoking OR have quit w/in 15years.) does qualify.   Lung Cancer Screening Referral: 09/04/2022  Additional Screening:  Hepatitis C Screening: does not qualify; Completed 03/06/2015  Vision Screening: Recommended annual ophthalmology exams for early detection of glaucoma and other disorders of the eye. Is the patient up to date with their annual eye exam?  Yes  Who is the provider or what is the name of the office in which the patient attends annual eye exams? Dr.Simpson If pt is not established with a provider, would they like to be referred to a provider to establish care? No .   Dental Screening: Recommended annual dental exams for proper oral hygiene  Community Resource Referral / Chronic Care Management: CRR required this visit?  No   CCM required this visit?  No      Plan:     I have personally reviewed and noted the following in the patient's chart:   Medical and social history Use of alcohol, tobacco or illicit drugs  Current medications and supplements including opioid prescriptions. Patient is not currently taking opioid prescriptions. Functional ability and  status Nutritional status Physical activity Advanced directives List of other physicians Hospitalizations, surgeries, and ER visits in previous 12 months Vitals Screenings to include cognitive, depression, and falls Referrals and appointments  In addition, I have reviewed and discussed with patient certain preventive protocols, quality metrics, and best practice recommendations. A written personalized care plan for preventive services as well as general preventive health recommendations were provided to patient.     Craig Dy, MD   09/04/2022

## 2022-09-04 NOTE — Patient Instructions (Signed)
  [name] , Thank you for taking time to come for your Medicare Wellness Visit. I appreciate your ongoing commitment to your health goals. Please review the following plan we discussed and let me know if I can assist you in the future.   These are the goals we discussed: Patient is going to continue to cut back and try to quit smoking.   This is a list of the screening recommended for you and due dates:  Health Maintenance  Topic Date Due   COVID-19 Vaccine (4 - 2023-24 season) 02/10/2022   Medicare Annual Wellness Visit  09/04/2023   DTaP/Tdap/Td vaccine (4 - Td or Tdap) 02/25/2031   Colon Cancer Screening  08/06/2031   Flu Shot  Completed   Hepatitis C Screening: USPSTF Recommendation to screen - Ages 18-79 yo.  Completed   HIV Screening  Completed   Zoster (Shingles) Vaccine  Completed   HPV Vaccine  Aged Out

## 2022-09-07 ENCOUNTER — Ambulatory Visit: Payer: Medicare Other | Admitting: Family Medicine

## 2022-09-12 ENCOUNTER — Ambulatory Visit (INDEPENDENT_AMBULATORY_CARE_PROVIDER_SITE_OTHER): Payer: 59 | Admitting: Family Medicine

## 2022-09-12 ENCOUNTER — Encounter: Payer: Self-pay | Admitting: Family Medicine

## 2022-09-12 ENCOUNTER — Other Ambulatory Visit: Payer: Self-pay | Admitting: Family Medicine

## 2022-09-12 VITALS — BP 120/70 | HR 72 | Ht 68.0 in | Wt 156.0 lb

## 2022-09-12 DIAGNOSIS — Z125 Encounter for screening for malignant neoplasm of prostate: Secondary | ICD-10-CM

## 2022-09-12 DIAGNOSIS — E785 Hyperlipidemia, unspecified: Secondary | ICD-10-CM

## 2022-09-12 DIAGNOSIS — T50905A Adverse effect of unspecified drugs, medicaments and biological substances, initial encounter: Secondary | ICD-10-CM | POA: Diagnosis not present

## 2022-09-12 DIAGNOSIS — I10 Essential (primary) hypertension: Secondary | ICD-10-CM

## 2022-09-12 DIAGNOSIS — E559 Vitamin D deficiency, unspecified: Secondary | ICD-10-CM

## 2022-09-12 DIAGNOSIS — Z720 Tobacco use: Secondary | ICD-10-CM

## 2022-09-12 DIAGNOSIS — N62 Hypertrophy of breast: Secondary | ICD-10-CM | POA: Diagnosis not present

## 2022-09-12 DIAGNOSIS — F17290 Nicotine dependence, other tobacco product, uncomplicated: Secondary | ICD-10-CM

## 2022-09-12 DIAGNOSIS — Z13220 Encounter for screening for lipoid disorders: Secondary | ICD-10-CM

## 2022-09-12 NOTE — Patient Instructions (Addendum)
Annual exam early October, call if you need me sooner  Aim for 2 cigarettes/ day or less  by next visit  Nurse pls look into chest scan previously ordered  by dr Court Joy, still no appt made and follow through ( let's discuss also best way)  CBC, lipid, cmp and EGFr, TSH, pSA  free and total, vit D today  It is important that you exercise regularly at least 30 minutes 5 times a week. If you develop chest pain, have severe difficulty breathing, or feel very tired, stop exercising immediately and seek medical attention   Think about what you will eat, plan ahead. Choose " clean, green, fresh or frozen" over canned, processed or packaged foods which are more sugary, salty and fatty. 70 to 75% of food eaten should be vegetables and fruit. Three meals at set times with snacks allowed between meals, but they must be fruit or vegetables. Aim to eat over a 12 hour period , example 7 am to 7 pm, and STOP after  your last meal of the day. Drink water,generally about 64 ounces per day, no other drink is as healthy. Fruit juice is best enjoyed in a healthy way, by EATING the fruit.  Thanks for choosing [facility], we consider it a privelige to serve you.

## 2022-09-13 LAB — CMP14+EGFR
ALT: 17 IU/L (ref 0–44)
AST: 19 IU/L (ref 0–40)
Albumin/Globulin Ratio: 1.2 (ref 1.2–2.2)
Albumin: 3.9 g/dL (ref 3.9–4.9)
Alkaline Phosphatase: 106 IU/L (ref 44–121)
BUN/Creatinine Ratio: 10 (ref 10–24)
BUN: 13 mg/dL (ref 8–27)
Bilirubin Total: <0.2 mg/dL (ref 0.0–1.2)
CO2: 22 mmol/L (ref 20–29)
Calcium: 9.1 mg/dL (ref 8.6–10.2)
Chloride: 101 mmol/L (ref 96–106)
Creatinine, Ser: 1.31 mg/dL — ABNORMAL HIGH (ref 0.76–1.27)
Globulin, Total: 3.3 g/dL (ref 1.5–4.5)
Glucose: 96 mg/dL (ref 70–99)
Potassium: 4.4 mmol/L (ref 3.5–5.2)
Sodium: 136 mmol/L (ref 134–144)
Total Protein: 7.2 g/dL (ref 6.0–8.5)
eGFR: 61 mL/min/{1.73_m2} (ref >59)

## 2022-09-13 LAB — CBC
Hematocrit: 39 % (ref 37.5–51.0)
Hemoglobin: 12.9 g/dL — ABNORMAL LOW (ref 13.0–17.7)
MCH: 29.1 pg (ref 26.6–33.0)
MCHC: 33.1 g/dL (ref 31.5–35.7)
MCV: 88 fL (ref 79–97)
Platelets: 250 10*3/uL (ref 150–450)
RBC: 4.43 x10E6/uL (ref 4.14–5.80)
RDW: 14 % (ref 11.6–15.4)
WBC: 6.3 10*3/uL (ref 3.4–10.8)

## 2022-09-13 LAB — LIPID PANEL
Chol/HDL Ratio: 2.2 ratio (ref 0.0–5.0)
Cholesterol, Total: 110 mg/dL (ref 100–199)
HDL: 50 mg/dL (ref >39)
LDL Chol Calc (NIH): 46 mg/dL (ref 0–99)
Triglycerides: 62 mg/dL (ref 0–149)
VLDL Cholesterol Cal: 14 mg/dL (ref 5–40)

## 2022-09-13 LAB — PSA: Prostate Specific Ag, Serum: 3.1 ng/mL (ref 0.0–4.0)

## 2022-09-13 LAB — TSH: TSH: 1.29 u[IU]/mL (ref 0.450–4.500)

## 2022-09-13 LAB — VITAMIN D 25 HYDROXY (VIT D DEFICIENCY, FRACTURES): Vit D, 25-Hydroxy: 39.9 ng/mL (ref 30.0–100.0)

## 2022-09-17 ENCOUNTER — Encounter: Payer: Self-pay | Admitting: Family Medicine

## 2022-09-17 DIAGNOSIS — E559 Vitamin D deficiency, unspecified: Secondary | ICD-10-CM | POA: Insufficient documentation

## 2022-09-17 NOTE — Assessment & Plan Note (Signed)
Resolved off of spironolactone, viewed pic from 04/2022 visit

## 2022-09-17 NOTE — Assessment & Plan Note (Signed)
Updated lab needed at/ before next visit.   

## 2022-09-17 NOTE — Progress Notes (Signed)
   TABIAS FICK     MRN: [id number]      DOB: [birth year]   HPI [name] is here for follow up and re-evaluation of chronic medical conditions, medication management and review of any available recent lab and radiology data.  Preventive health is updated, specifically  Cancer screening and Immunization.   Questions or concerns regarding consultations or procedures which the PT has had in the interim are  addressed. The PT denies any adverse reactions to current medications since the last visit.  There are no new concerns.  There are no specific complaints   ROS Denies recent fever or chills. Denies sinus pressure, nasal congestion, ear pain or sore throat. Denies chest congestion, productive cough or wheezing. Denies chest pains, palpitations and leg swelling Denies abdominal pain, nausea, vomiting,diarrhea or constipation.   Denies dysuria, frequency, hesitancy or incontinence. Denies joint pain, swelling and limitation in mobility. Denies headaches, seizures, numbness, or tingling. Denies depression, anxiety or insomnia. Denies skin break down or rash.   PE  BP 120/70   Pulse 72   Ht 5\' 8"  (1.727 m)   Wt 156 lb 0.6 oz (70.8 kg)   SpO2 95%   BMI 23.73 kg/m   Patient alert and oriented and in no cardiopulmonary distress.  HEENT: No facial asymmetry, EOMI,     Neck supple .  Chest: Clear to auscultation bilaterally.  CVS: S1, S2 no murmurs, no S3.Regular rate.  ABD: Soft non tender.   Ext: No edema  MS: Adequate ROM spine, shoulders, hips and knees.  Skin: Intact, no ulcerations or rash noted.  Psych: Good eye contact, normal affect. Memory intact not anxious or depressed appearing.  CNS: CN 2-12 intact, power,  normal throughout.no focal deficits noted.   Assessment & Plan  Essential hypertension Controlled, no change in medication DASH diet and commitment to daily physical activity for a minimum of 30 minutes discussed and encouraged, as a part of  hypertension management. The importance of attaining a healthy weight is also discussed.     09/12/2022    8:50 AM 09/12/2022    8:19 AM 04/24/2022    8:39 AM 04/10/2022    2:54 PM 04/09/2022    7:21 AM 04/09/2022    7:18 AM 03/08/2022   10:10 AM  BP/Weight  Systolic BP 120 131 120 119 140  130  Diastolic BP 70 78 70 69 81  72  Wt. (Lbs)  156.04 162.08 161  160   BMI  23.73 kg/m2 24.64 kg/m2 24.48 kg/m2  24.33 kg/m2        Hyperlipidemia LDL goal <100 Hyperlipidemia:Low fat diet discussed and encouraged.   Lipid Panel  Lab Results  Component Value Date   CHOL 110 09/12/2022   HDL 50 09/12/2022   LDLCALC 46 09/12/2022   TRIG 62 09/12/2022   CHOLHDL 2.2 09/12/2022     Controlled, no change in medication   Tobacco abuse Asked:confirms currently smokes cigars Assess: Unwilling to set a quit date Advise: needs to QUIT to reduce risk of cancer, cardio and cerebrovascular disease Assist: counseled for 5 minutes and literature provided Arrange: follow up in 2 to 4 months   Drug-induced gynecomastia Resolved off of spironolactone, viewed pic from 04/2022 visit  Vitamin D deficiency Updated lab needed at/ before next visit.

## 2022-09-17 NOTE — Assessment & Plan Note (Signed)
Asked:confirms currently smokes cigars Assess: Unwilling to set a quit date Advise: needs to QUIT to reduce risk of cancer, cardio and cerebrovascular disease Assist: counseled for 5 minutes and literature provided Arrange: follow up in 2 to 4 months

## 2022-09-17 NOTE — Assessment & Plan Note (Signed)
Controlled, no change in medication DASH diet and commitment to daily physical activity for a minimum of 30 minutes discussed and encouraged, as a part of hypertension management. The importance of attaining a healthy weight is also discussed.     09/12/2022    8:50 AM 09/12/2022    8:19 AM 04/24/2022    8:39 AM 04/10/2022    2:54 PM 04/09/2022    7:21 AM 04/09/2022    7:18 AM 03/08/2022   10:10 AM  BP/Weight  Systolic BP 120 131 120 119 140  130  Diastolic BP 70 78 70 69 81  72  Wt. (Lbs)  156.04 162.08 161  160   BMI  23.73 kg/m2 24.64 kg/m2 24.48 kg/m2  24.33 kg/m2

## 2022-09-17 NOTE — Assessment & Plan Note (Signed)
Hyperlipidemia:Low fat diet discussed and encouraged.   Lipid Panel  Lab Results  Component Value Date   CHOL 110 09/12/2022   HDL 50 09/12/2022   LDLCALC 46 09/12/2022   TRIG 62 09/12/2022   CHOLHDL 2.2 09/12/2022     Controlled, no change in medication

## 2022-10-02 NOTE — Progress Notes (Signed)
[facility] referral received. Called and spoke with patient who requests that I call back at another time.

## 2022-11-28 NOTE — Progress Notes (Signed)
[facility] referral received. Call placed to patient who states that he would like to wait on scheduling [facility]. He will let Dr. Lodema Hong know when he is ready to proceed so that she can re-refer him to the program. Referral closed at this time.

## 2022-12-11 ENCOUNTER — Other Ambulatory Visit: Payer: Self-pay | Admitting: Family Medicine

## 2022-12-17 ENCOUNTER — Other Ambulatory Visit: Payer: Self-pay | Admitting: Family Medicine

## 2023-03-11 ENCOUNTER — Other Ambulatory Visit: Payer: Self-pay | Admitting: Family Medicine

## 2023-03-14 ENCOUNTER — Encounter: Payer: 59 | Admitting: Family Medicine

## 2023-05-03 ENCOUNTER — Encounter: Payer: Self-pay | Admitting: Family Medicine

## 2023-05-03 ENCOUNTER — Ambulatory Visit (INDEPENDENT_AMBULATORY_CARE_PROVIDER_SITE_OTHER): Payer: 59 | Admitting: Family Medicine

## 2023-05-03 VITALS — BP 120/70 | HR 69 | Ht 68.0 in | Wt 159.0 lb

## 2023-05-03 DIAGNOSIS — Z0000 Encounter for general adult medical examination without abnormal findings: Secondary | ICD-10-CM | POA: Diagnosis not present

## 2023-05-03 DIAGNOSIS — Z9189 Other specified personal risk factors, not elsewhere classified: Secondary | ICD-10-CM

## 2023-05-03 DIAGNOSIS — I10 Essential (primary) hypertension: Secondary | ICD-10-CM | POA: Diagnosis not present

## 2023-05-03 DIAGNOSIS — E785 Hyperlipidemia, unspecified: Secondary | ICD-10-CM

## 2023-05-03 DIAGNOSIS — Z720 Tobacco use: Secondary | ICD-10-CM

## 2023-05-03 NOTE — Assessment & Plan Note (Signed)
Asked:confirms currently smokes cigarettes 4 to  5/day Assess: Unwilling to set a quit date, but is cutting back Advise: needs to QUIT to reduce risk of cancer, cardio and cerebrovascular disease Assist: counseled for 5 minutes and literature provided Arrange: follow up in 2 to 4 months

## 2023-05-03 NOTE — Patient Instructions (Signed)
F/U in 6 months, call if you need me sooner  You are up to date with all vaccines, need Pneumonia vaccine next year after age 62  EKG in office today   Please work on stopping smoking , that is best for your health   Fasting lipid, cmp annd eGFR in next 1 week  Thanks for choosing  [facility], we consider it a privelige to serve you.

## 2023-05-03 NOTE — Assessment & Plan Note (Signed)
Controlled, no change in medication DASH diet and commitment to daily physical activity for a minimum of 30 minutes discussed and encouraged, as a part of hypertension management. The importance of attaining a healthy weight is also discussed.     05/03/2023    2:59 PM 09/12/2022    8:50 AM 09/12/2022    8:19 AM 04/24/2022    8:39 AM 04/10/2022    2:54 PM 04/09/2022    7:21 AM 04/09/2022    7:18 AM  BP/Weight  Systolic BP 120 120 131 120 119 140   Diastolic BP 70 70 78 70 69 81   Wt. (Lbs) 159  156.04 162.08 161  160  BMI 24.18 kg/m2  23.73 kg/m2 24.64 kg/m2 24.48 kg/m2  24.33 kg/m2     Needs EKG

## 2023-05-03 NOTE — Progress Notes (Signed)
   CHARLESTON LANSDOWN     MRN: [id number]      DOB: 03/28/1961  Chief Complaint  Patient presents with   Annual Exam    CPE no concerns    HPI: Patient is in for annual physical exam. No other health concerns are expressed or addressed at the visit. . Immunization is reviewed , and  is up to date  PE; BP 120/70 (BP Location: Right Arm, Patient Position: Sitting, Cuff Size: Large)   Pulse 69   Ht 5\' 8"  (1.727 m)   Wt 159 lb (72.1 kg)   SpO2 98%   BMI 24.18 kg/m   Pleasant male, alert and oriented x 3, in no cardio-pulmonary distress. Afebrile. HEENT No facial trauma or asymetry. Sinuses non tender. EOMI External ears normal,  Neck: supple, no adenopathy,JVD or thyromegaly.No bruits.  Chest: Clear to ascultation bilaterally.No crackles or wheezes. Non tender to palpation  Cardiovascular system; Heart sounds normal,  S1 and  S2 ,no S3.  No murmur, or thrill. Apical beat not displaced Peripheral pulses normal.  Abdomen: Soft, non tender, no organomegaly or masses. No bruits. Bowel sounds normal. No guarding, tenderness or rebound.    Musculoskeletal exam: Full ROM of spine, hips , shoulders and knees. No deformity ,swelling or crepitus noted. No muscle wasting or atrophy.   Neurologic: Cranial nerves 2 to 12 intact. Power, tone ,sensation and reflexes normal throughout. No disturbance in gait. No tremor.  Skin: Intact, no ulceration, erythema , scaling or rash noted. Pigmentation normal throughout  Psych; Normal mood and affect. Judgement and concentration normal   Assessment & Plan:  Encounter for annual physical exam Annual exam as documented. Counseling done  re healthy lifestyle involving commitment to 150 minutes exercise per week, heart healthy diet, and attaining healthy weight.The importance of adequate sleep also discussed. Rn by the patient with goals and time frames  set for achieving them. Immunization and cancer screening needs are  specifically addressed at this visit.   Tobacco abuse Asked:confirms currently smokes cigarettes 4 to  5/day Assess: Unwilling to set a quit date, but is cutting back Advise: needs to QUIT to reduce risk of cancer, cardio and cerebrovascular disease Assist: counseled for 5 minutes and literature provided Arrange: follow up in 2 to 4 months   Essential hypertension Controlled, no change in medication DASH diet and commitment to daily physical activity for a minimum of 30 minutes discussed and encouraged, as a part of hypertension management. The importance of attaining a healthy weight is also discussed.     05/03/2023    2:59 PM 09/12/2022    8:50 AM 09/12/2022    8:19 AM 04/24/2022    8:39 AM 04/10/2022    2:54 PM 04/09/2022    7:21 AM 04/09/2022    7:18 AM  BP/Weight  Systolic BP 120 120 131 120 119 140   Diastolic BP 70 70 78 70 69 81   Wt. (Lbs) 159  156.04 162.08 161  160  BMI 24.18 kg/m2  23.73 kg/m2 24.64 kg/m2 24.48 kg/m2  24.33 kg/m2     Needs EKG  At increased risk for cardiovascular disease Counseled re need to quit niotine use to lower risk, EKG in office

## 2023-05-03 NOTE — Assessment & Plan Note (Signed)
Annual exam as documented. Counseling done  re healthy lifestyle involving commitment to 150 minutes exercise per week, heart healthy diet, and attaining healthy weight.The importance of adequate sleep also discussed. Rn by the patient with goals and time frames  set for achieving them. Immunization and cancer screening needs are specifically addressed at this visit.

## 2023-05-04 DIAGNOSIS — E785 Hyperlipidemia, unspecified: Secondary | ICD-10-CM | POA: Diagnosis not present

## 2023-05-04 DIAGNOSIS — I10 Essential (primary) hypertension: Secondary | ICD-10-CM | POA: Diagnosis not present

## 2023-05-05 LAB — CMP14+EGFR
ALT: 18 [IU]/L (ref 0–44)
AST: 19 [IU]/L (ref 0–40)
Albumin: 4 g/dL (ref 3.9–4.9)
Alkaline Phosphatase: 93 [IU]/L (ref 44–121)
BUN/Creatinine Ratio: 13 (ref 10–24)
BUN: 15 mg/dL (ref 8–27)
Bilirubin Total: 0.2 mg/dL (ref 0.0–1.2)
CO2: 21 mmol/L (ref 20–29)
Calcium: 9.3 mg/dL (ref 8.6–10.2)
Chloride: 103 mmol/L (ref 96–106)
Creatinine, Ser: 1.19 mg/dL (ref 0.76–1.27)
Globulin, Total: 3.2 g/dL (ref 1.5–4.5)
Glucose: 79 mg/dL (ref 70–99)
Potassium: 4.5 mmol/L (ref 3.5–5.2)
Sodium: 140 mmol/L (ref 134–144)
Total Protein: 7.2 g/dL (ref 6.0–8.5)
eGFR: 68 mL/min/{1.73_m2} (ref >59)

## 2023-05-05 LAB — LIPID PANEL
Chol/HDL Ratio: 2.4 ratio (ref 0.0–5.0)
Cholesterol, Total: 127 mg/dL (ref 100–199)
HDL: 52 mg/dL (ref >39)
LDL Chol Calc (NIH): 61 mg/dL (ref 0–99)
Triglycerides: 65 mg/dL (ref 0–149)
VLDL Cholesterol Cal: 14 mg/dL (ref 5–40)

## 2023-05-06 DIAGNOSIS — Z9189 Other specified personal risk factors, not elsewhere classified: Secondary | ICD-10-CM | POA: Insufficient documentation

## 2023-05-06 NOTE — Assessment & Plan Note (Signed)
Counseled re need to quit niotine use to lower risk, EKG in office

## 2023-05-09 ENCOUNTER — Ambulatory Visit (INDEPENDENT_AMBULATORY_CARE_PROVIDER_SITE_OTHER): Payer: 59 | Admitting: Family Medicine

## 2023-05-09 ENCOUNTER — Encounter: Payer: Self-pay | Admitting: Family Medicine

## 2023-05-09 VITALS — BP 124/71 | HR 85 | Ht 68.0 in | Wt 157.1 lb

## 2023-05-09 DIAGNOSIS — M7552 Bursitis of left shoulder: Secondary | ICD-10-CM | POA: Diagnosis not present

## 2023-05-09 DIAGNOSIS — I10 Essential (primary) hypertension: Secondary | ICD-10-CM | POA: Diagnosis not present

## 2023-05-09 HISTORY — DX: Bursitis of left shoulder: M75.52

## 2023-05-09 MED ORDER — FAMOTIDINE 40 MG PO TABS: 40.0000 mg | ORAL_TABLET | Freq: Every day | ORAL | 0 refills | Status: DC

## 2023-05-09 MED ORDER — METHYLPREDNISOLONE ACETATE 80 MG/ML IJ SUSP
80.0000 mg | Freq: Once | INTRAMUSCULAR | Status: AC
  Administered 2023-05-09: 80 mg via INTRAMUSCULAR

## 2023-05-09 MED ORDER — PREDNISONE 20 MG PO TABS: ORAL_TABLET | ORAL | 0 refills | Status: DC

## 2023-05-09 MED ORDER — KETOROLAC TROMETHAMINE 60 MG/2ML IM SOLN
60.0000 mg | Freq: Once | INTRAMUSCULAR | Status: AC
  Administered 2023-05-09: 60 mg via INTRAMUSCULAR

## 2023-05-09 NOTE — Assessment & Plan Note (Signed)
Controlled, no change in medication  

## 2023-05-09 NOTE — Assessment & Plan Note (Addendum)
Uncontrolled.Toradol 60 mg and depo medrol 80 mg  administered IM in the office , to be followed by a short course of oral prednisone

## 2023-05-09 NOTE — Patient Instructions (Addendum)
Follow-up as before, call if you need me sooner.  You are treated today for acute bursitis of your left shoulder.  2 injections will be administered in the office and a 5-day course of prednisone is sent to your pharmacy.  Start taking the prednisone today. Also 10 days of pepcid to protect your stomach  Contact the office next week Monday if your symptoms have not improved significantly, at that time I will refer you to orthopedics on an urgent basis

## 2023-05-14 NOTE — Progress Notes (Signed)
   BOSTEN FAGOT     MRN: [id number]      DOB: 01/26/1961  Chief Complaint  Patient presents with   Follow-up    Left shoulder pain x 3 days    HPI Mr. Henriques is here with a 3 day h/o unprovoked swelling and pain of left shoulder, first episode Npo other complaints RS See HPI  Denies recent fever or chills. . Denies chest pains, palpitations and leg swelling Denies abdominal pain, nausea, vomiting,diarrhea or constipation.   ce. Denies skin break down or rash.   PE  BP 124/71 (BP Location: Right Arm, Patient Position: Sitting, Cuff Size: Large)   Pulse 85   Ht 5\' 8"  (1.727 m)   Wt 157 lb 1.9 oz (71.3 kg)   SpO2 97%   BMI 23.89 kg/m   Patient alert and oriented and in no cardiopulmonary distress.  HEENT: No facial asymmetry, EOMI,     Neck supple .  Chest: Clear to auscultation bilaterally.  CVS: S1, S2 no murmurs, no S3.Regular rate.  MS: Adequate ROM spine, decrased ROM left shoulder which is swollen and tender.  Skin: Intact, no ulcerations or rash noted.  Psych: Good eye contact, normal affect. Memory intact not anxious or depressed appearing.  CNS: CN 2-12 intact, power,  normal throughout.no focal deficits noted.   Assessment & Plan  Acute shoulder bursitis, left Uncontrolled.Toradol 60 mg and depo medrol 80 mg  administered IM in the office , to be followed by a short course of oral prednisone   Essential hypertension Controlled, no change in medication

## 2023-06-10 ENCOUNTER — Other Ambulatory Visit: Payer: Self-pay | Admitting: Family Medicine

## 2023-06-19 ENCOUNTER — Other Ambulatory Visit: Payer: Self-pay | Admitting: Family Medicine

## 2023-09-08 ENCOUNTER — Other Ambulatory Visit: Payer: Self-pay | Admitting: Family Medicine

## 2023-10-31 ENCOUNTER — Ambulatory Visit (INDEPENDENT_AMBULATORY_CARE_PROVIDER_SITE_OTHER): Payer: 59 | Admitting: Family Medicine

## 2023-10-31 ENCOUNTER — Encounter: Payer: Self-pay | Admitting: Family Medicine

## 2023-10-31 VITALS — BP 148/82 | HR 79 | Resp 18 | Ht 68.0 in | Wt 162.0 lb

## 2023-10-31 DIAGNOSIS — Z23 Encounter for immunization: Secondary | ICD-10-CM

## 2023-10-31 DIAGNOSIS — Z125 Encounter for screening for malignant neoplasm of prostate: Secondary | ICD-10-CM | POA: Insufficient documentation

## 2023-10-31 DIAGNOSIS — E785 Hyperlipidemia, unspecified: Secondary | ICD-10-CM | POA: Diagnosis not present

## 2023-10-31 DIAGNOSIS — I10 Essential (primary) hypertension: Secondary | ICD-10-CM

## 2023-10-31 DIAGNOSIS — Z720 Tobacco use: Secondary | ICD-10-CM

## 2023-10-31 DIAGNOSIS — F17210 Nicotine dependence, cigarettes, uncomplicated: Secondary | ICD-10-CM

## 2023-10-31 DIAGNOSIS — E559 Vitamin D deficiency, unspecified: Secondary | ICD-10-CM | POA: Diagnosis not present

## 2023-10-31 HISTORY — DX: Encounter for immunization: Z23

## 2023-10-31 MED ORDER — SPIRONOLACTONE 25 MG PO TABS: 25.0000 mg | ORAL_TABLET | Freq: Every day | ORAL | 3 refills | Status: DC

## 2023-10-31 MED ORDER — OLMESARTAN MEDOXOMIL 20 MG PO TABS: 20.0000 mg | ORAL_TABLET | Freq: Every day | ORAL | 2 refills | Status: DC

## 2023-10-31 MED ORDER — DILTIAZEM HCL ER BEADS 360 MG PO CP24: 360.0000 mg | ORAL_CAPSULE | Freq: Every day | ORAL | 3 refills | Status: DC

## 2023-10-31 NOTE — Patient Instructions (Addendum)
 Follow-up and 6 weeks to reevaluate blood pressure, call if you need me sooner.  Annual exam 11/22 or after  New additional medication to be started today for your blood pressure which is high.   This is spironolactone  25 mg 1 daily.  Please take this along with your current blood pressure pill diltiazem  360 mg once daily.  Pneumonia 20 vaccine today.  Fasting labs today CBC lipid CMP and EGFR PSA TSH and vitamin D .  Cut back cigarettes to 5/day currently smoking 7/day, we will keep the extra money that you save.  More importantly we will save your health from potential problems in the future.  It is important that you exercise regularly at least 30 minutes 5 times a week. If you develop chest pain, have severe difficulty breathing, or feel very tired, stop exercising immediately and seek medical attention  Thanks for choosing [facility], we consider it a privelige to serve you.

## 2023-10-31 NOTE — Assessment & Plan Note (Signed)
 Asked:confirms currently smokes cigarettes 7/day Assess: Unwilling to set a quit date, not currently cutting back but will  aim for 5/day Advise: needs to QUIT to reduce risk of cancer, cardio and cerebrovascular disease Assist: counseled for 5 minutes and literature provided Arrange: follow up in 2 to 4 months

## 2023-10-31 NOTE — Assessment & Plan Note (Signed)
 Uncontrolled add olmesartan 20 mg dailuy DASH diet and commitment to daily physical activity for a minimum of 30 minutes discussed and encouraged, as a part of hypertension management. The importance of attaining a healthy weight is also discussed.     10/31/2023    8:27 AM 10/31/2023    8:04 AM 05/09/2023    9:05 AM 05/03/2023    2:59 PM 09/12/2022    8:50 AM 09/12/2022    8:19 AM 04/24/2022    8:39 AM  BP/Weight  Systolic BP 148 146 124 120 120 131 120  Diastolic BP 82 81 71 70 70 78 70  Wt. (Lbs)  162 157.12 159  156.04 162.08  BMI  24.63 kg/m2 23.89 kg/m2 24.18 kg/m2  23.73 kg/m2 24.64 kg/m2

## 2023-10-31 NOTE — Assessment & Plan Note (Signed)
 Updated lab needed at/ before next visit.

## 2023-10-31 NOTE — Progress Notes (Signed)
 Craig Shaffer     MRN: [id number]      DOB: July 14, 1960  Chief Complaint  Patient presents with   Hypertension    6 month follow up     HPI Craig Shaffer is here for follow up and re-evaluation of chronic medical conditions, medication management and review of any available recent lab and radiology data.  Preventive health is updated, specifically  Cancer screening and Immunization.   sed. The PT denies any adverse reactions to current medications since the last visit.  There are no new concerns.  There are no specific complaints   ROS Denies recent fever or chills. Denies sinus pressure, nasal congestion, ear pain or sore throat. Denies chest congestion, productive cough or wheezing. Denies chest pains, palpitations and leg swelling Denies abdominal pain, nausea, vomiting,diarrhea or constipation.   Denies dysuria, frequency, hesitancy or incontinence. Denies joint pain, swelling and limitation in mobility. Denies headaches, seizures, numbness, or tingling. Denies depression, anxiety or insomnia. Denies skin break down or rash.   PE  BP (!) 148/82   Pulse 79   Resp 18   Ht 5\' 8"  (1.727 m)   Wt 162 lb (73.5 kg)   SpO2 97%   BMI 24.63 kg/m   Patient alert and oriented and in no cardiopulmonary distress.  HEENT: No facial asymmetry, EOMI,     Neck supple .  Chest: Clear to auscultation bilaterally.  CVS: S1, S2 no murmurs, no S3.Regular rate.  ABD: Soft non tender.   Ext: No edema  MS: Adequate ROM spine, shoulders, hips and knees.  Skin: Intact, no ulcerations or rash noted.  Psych: Good eye contact, normal affect. Memory intact not anxious or depressed appearing.  CNS: CN 2-12 intact, power,  normal throughout.no focal deficits noted.   Assessment & Plan  Essential hypertension Uncontrolled add olmesartan 20 mg dailuy DASH diet and commitment to daily physical activity for a minimum of 30 minutes discussed and encouraged, as a part of  hypertension management. The importance of attaining a healthy weight is also discussed.     10/31/2023    8:27 AM 10/31/2023    8:04 AM 05/09/2023    9:05 AM 05/03/2023    2:59 PM 09/12/2022    8:50 AM 09/12/2022    8:19 AM 04/24/2022    8:39 AM  BP/Weight  Systolic BP 148 146 124 120 120 131 120  Diastolic BP 82 81 71 70 70 78 70  Wt. (Lbs)  162 157.12 159  156.04 162.08  BMI  24.63 kg/m2 23.89 kg/m2 24.18 kg/m2  23.73 kg/m2 24.64 kg/m2       Hyperlipidemia LDL goal <100 Hyperlipidemia:Low fat diet discussed and encouraged.   Lipid Panel  Lab Results  Component Value Date   CHOL 127 05/04/2023   HDL 52 05/04/2023   LDLCALC 61 05/04/2023   TRIG 65 05/04/2023   CHOLHDL 2.4 05/04/2023     Updated lab needed at/ before next visit.   Tobacco abuse Asked:confirms currently smokes cigarettes 7/day Assess: Unwilling to set a quit date, not currently cutting back but will  aim for 5/day Advise: needs to QUIT to reduce risk of cancer, cardio and cerebrovascular disease Assist: counseled for 5 minutes and literature provided Arrange: follow up in 2 to 4 months   Screening PSA (prostate specific antigen) Updated lab needed at/ before next visit.   Vitamin D  deficiency Updated lab needed at/ before next visit.   Encounter for immunization After obtaining informed consent, the Pneumonia  20 vaccine is  administered , with no adverse effect noted at the time of administration.

## 2023-10-31 NOTE — Assessment & Plan Note (Signed)
 Hyperlipidemia:Low fat diet discussed and encouraged.   Lipid Panel  Lab Results  Component Value Date   CHOL 127 05/04/2023   HDL 52 05/04/2023   LDLCALC 61 05/04/2023   TRIG 65 05/04/2023   CHOLHDL 2.4 05/04/2023     Updated lab needed at/ before next visit.

## 2023-10-31 NOTE — Assessment & Plan Note (Signed)
 After obtaining informed consent, the  Pneumonia 20 vaccine is  administered , with no adverse effect noted at the time of administration.

## 2023-11-01 ENCOUNTER — Ambulatory Visit: Payer: Self-pay | Admitting: Family Medicine

## 2023-11-01 LAB — CMP14+EGFR
ALT: 19 IU/L (ref 0–44)
AST: 23 IU/L (ref 0–40)
Albumin: 4.3 g/dL (ref 3.9–4.9)
Alkaline Phosphatase: 106 IU/L (ref 44–121)
BUN/Creatinine Ratio: 9 — ABNORMAL LOW (ref 10–24)
BUN: 10 mg/dL (ref 8–27)
Bilirubin Total: <0.2 mg/dL (ref 0.0–1.2)
CO2: 21 mmol/L (ref 20–29)
Calcium: 9.4 mg/dL (ref 8.6–10.2)
Chloride: 103 mmol/L (ref 96–106)
Creatinine, Ser: 1.17 mg/dL (ref 0.76–1.27)
Globulin, Total: 2.9 g/dL (ref 1.5–4.5)
Glucose: 89 mg/dL (ref 70–99)
Potassium: 4.6 mmol/L (ref 3.5–5.2)
Sodium: 140 mmol/L (ref 134–144)
Total Protein: 7.2 g/dL (ref 6.0–8.5)
eGFR: 69 mL/min/{1.73_m2} (ref >59)

## 2023-11-01 LAB — CBC WITH DIFFERENTIAL/PLATELET
Basophils Absolute: 0 10*3/uL (ref 0.0–0.2)
Basos: 1 %
EOS (ABSOLUTE): 0.4 10*3/uL (ref 0.0–0.4)
Eos: 6 %
Hematocrit: 40.3 % (ref 37.5–51.0)
Hemoglobin: 13.2 g/dL (ref 13.0–17.7)
Immature Grans (Abs): 0 10*3/uL (ref 0.0–0.1)
Immature Granulocytes: 0 %
Lymphocytes Absolute: 2.1 10*3/uL (ref 0.7–3.1)
Lymphs: 35 %
MCH: 29.4 pg (ref 26.6–33.0)
MCHC: 32.8 g/dL (ref 31.5–35.7)
MCV: 90 fL (ref 79–97)
Monocytes Absolute: 0.8 10*3/uL (ref 0.1–0.9)
Monocytes: 13 %
Neutrophils Absolute: 2.7 10*3/uL (ref 1.4–7.0)
Neutrophils: 45 %
Platelets: 239 10*3/uL (ref 150–450)
RBC: 4.49 x10E6/uL (ref 4.14–5.80)
RDW: 13.5 % (ref 11.6–15.4)
WBC: 5.9 10*3/uL (ref 3.4–10.8)

## 2023-11-01 LAB — LIPID PANEL
Chol/HDL Ratio: 2.4 ratio (ref 0.0–5.0)
Cholesterol, Total: 136 mg/dL (ref 100–199)
HDL: 56 mg/dL (ref >39)
LDL Chol Calc (NIH): 65 mg/dL (ref 0–99)
Triglycerides: 77 mg/dL (ref 0–149)
VLDL Cholesterol Cal: 15 mg/dL (ref 5–40)

## 2023-11-01 LAB — TSH: TSH: 1.73 u[IU]/mL (ref 0.450–4.500)

## 2023-11-01 LAB — PSA: Prostate Specific Ag, Serum: 3.8 ng/mL (ref 0.0–4.0)

## 2023-11-01 LAB — VITAMIN D 25 HYDROXY (VIT D DEFICIENCY, FRACTURES): Vit D, 25-Hydroxy: 44.8 ng/mL (ref 30.0–100.0)

## 2023-11-13 ENCOUNTER — Ambulatory Visit

## 2023-11-13 VITALS — Ht 68.0 in | Wt 162.0 lb

## 2023-11-13 DIAGNOSIS — Z0000 Encounter for general adult medical examination without abnormal findings: Secondary | ICD-10-CM | POA: Diagnosis not present

## 2023-11-13 DIAGNOSIS — F17200 Nicotine dependence, unspecified, uncomplicated: Secondary | ICD-10-CM

## 2023-11-13 DIAGNOSIS — F17210 Nicotine dependence, cigarettes, uncomplicated: Secondary | ICD-10-CM

## 2023-11-13 NOTE — Patient Instructions (Signed)
 [name] , Thank you for taking time out of your busy schedule to complete your Annual Wellness Visit with me. I enjoyed our conversation and look forward to speaking with you again next year. I, as well as your care team,  appreciate your ongoing commitment to your health goals. Please review the following plan we discussed and let me know if I can assist you in the future. Your Game plan/ To Do List    Referrals: If you haven't heard from the office you've been referred to, please reach out to them at the phone number provided.  You have been referred to have a low dose lung cancer screening test. If you haven't heard from them in a week please call one of the numbers listed below.   [facility] [address] [place] [address] Selene Dais Phone: [phone] [facility] Phone: [phone]  Follow up Visits: Next Medicare AWV with our clinical staff:   November 17, 2024 at 10:00 am telephone visit Have you seen your provider in the last 6 months (3 months if uncontrolled diabetes)? Yes Next Office Visit with your provider:   December 12, 2023 at 8:20 am   Clinician Recommendations:   Aim for 30 minutes of exercise or brisk walking, 6-8 glasses of water , and 5 servings of fruits and vegetables each day.  I enjoyed our conversation today and look forward to talking with you again next year!! Have a wonderful and safe year. All the best, Mahrosh Donnell This is a list of the screening recommended for you and due dates:  Health Maintenance  Topic Date Due   COVID-19 Vaccine (5 - 2024-25 season) 06/14/2023   Screening for Lung Cancer  Never done   Flu Shot  01/11/2024   Medicare Annual Wellness Visit  11/12/2024   Colon Cancer Screening  08/05/2026   DTaP/Tdap/Td vaccine (4 - Td or Tdap) 02/25/2031   Pneumonia Vaccine  Completed   Hepatitis C Screening  Completed   HIV Screening  Completed   Zoster (Shingles) Vaccine  Completed   HPV Vaccine  Aged Out   Meningitis B Vaccine  Aged Out     Advanced directives: (Declined) Advance directive discussed with you today. Even though you declined this today, please call our office should you change your mind, and we can give you the proper paperwork for you to fill out. Advance Care Planning is important because it:  [x]  Makes sure you receive the medical care that is consistent with your values, goals, and preferences  [x]  It provides guidance to your family and loved ones and reduces their decisional burden about whether or not they are making the right decisions based on your wishes.  Follow the link provided in your after visit summary or read over the paperwork we have mailed to you to help you started getting your Advance Directives in place. If you need assistance in completing these, please reach out to us  so that we can help you! See attachments for Preventive Care and Fall Prevention Tips.  Understanding Your Risk for Falls Millions of people have serious injuries from falls each year. It is important to understand your risk of falling. Talk with your health care provider about your risk and what you can do to lower it. If you do have a serious fall, make sure to tell your provider. Falling once raises your risk of falling again. How can falls affect me? Serious injuries from falls are common. These include: Broken bones, such as hip fractures. Head  injuries, such as traumatic brain injuries (TBI) or concussions. A fear of falling can cause you to avoid activities and stay at home. This can make your muscles weaker and raise your risk for a fall. What can increase my risk? There are a number of risk factors that increase your risk for falling. The more risk factors you have, the higher your risk of falling. Serious injuries from a fall happen most often to people who are older than 65 years old. Teenagers and young adults ages 62-29 are also at higher risk. Common risk factors include: Weakness in the lower body. Being  generally weak or confused due to long-term (chronic) illness. Dizziness or balance problems. Poor vision. Medicines that cause dizziness or drowsiness. These may include: Medicines for your blood pressure, heart, anxiety, insomnia, or swelling (edema). Pain medicines. Muscle relaxants. Other risk factors include: Drinking alcohol. Having had a fall in the past. Having foot pain or wearing improper footwear. Working at a dangerous job. Having any of the following in your home: Tripping hazards, such as floor clutter or loose rugs. Poor lighting. Pets. Having dementia or memory loss. What actions can I take to lower my risk of falling?     Physical activity Stay physically fit. Do strength and balance exercises. Consider taking a regular class to build strength and balance. Yoga and tai chi are good options. Vision Have your eyes checked every year and your prescription for glasses or contacts updated as needed. Shoes and walking aids Wear non-skid shoes. Wear shoes that have rubber soles and low heels. Do not wear high heels. Do not walk around the house in socks or slippers. Use a cane or walker as told by your provider. Home safety Attach secure railings on both sides of your stairs. Install grab bars for your bathtub, shower, and toilet. Use a non-skid mat in your bathtub or shower. Attach bath mats securely with double-sided, non-slip rug tape. Use good lighting in all rooms. Keep a flashlight near your bed. Make sure there is a clear path from your bed to the bathroom. Use night-lights. Do not use throw rugs. Make sure all carpeting is taped or tacked down securely. Remove all clutter from walkways and stairways, including extension cords. Repair uneven or broken steps and floors. Avoid walking on icy or slippery surfaces. Walk on the grass instead of on icy or slick sidewalks. Use ice melter to get rid of ice on walkways in the winter. Use a cordless phone. Questions to  ask your health care provider Can you help me check my risk for a fall? Do any of my medicines make me more likely to fall? Should I take a vitamin D  supplement? What exercises can I do to improve my strength and balance? Should I make an appointment to have my vision checked? Do I need a bone density test to check for weak bones (osteoporosis)? Would it help to use a cane or a walker? Where to find more information Centers for Disease Control and Prevention, STEADI: TonerPromos.no Community-Based Fall Prevention Programs: TonerPromos.no [employer] on Aging: BaseRingTones.pl Contact a health care provider if: You fall at home. You are afraid of falling at home. You feel weak, drowsy, or dizzy. This information is not intended to replace advice given to you by your health care provider. Make sure you discuss any questions you have with your health care provider. Document Revised: 01/30/2022 Document Reviewed: 01/30/2022 Elsevier Patient Education  2024 [employer].  Understanding Your Risk for Falls  Millions of people have serious injuries from falls each year. It is important to understand your risk of falling. Talk with your health care provider about your risk and what you can do to lower it. If you do have a serious fall, make sure to tell your provider. Falling once raises your risk of falling again. How can falls affect me? Serious injuries from falls are common. These include: Broken bones, such as hip fractures. Head injuries, such as traumatic brain injuries (TBI) or concussions. A fear of falling can cause you to avoid activities and stay at home. This can make your muscles weaker and raise your risk for a fall. What can increase my risk? There are a number of risk factors that increase your risk for falling. The more risk factors you have, the higher your risk of falling. Serious injuries from a fall happen most often to people who are older than 65 years old. Teenagers and young adults  ages 69-29 are also at higher risk. Common risk factors include: Weakness in the lower body. Being generally weak or confused due to long-term (chronic) illness. Dizziness or balance problems. Poor vision. Medicines that cause dizziness or drowsiness. These may include: Medicines for your blood pressure, heart, anxiety, insomnia, or swelling (edema). Pain medicines. Muscle relaxants. Other risk factors include: Drinking alcohol. Having had a fall in the past. Having foot pain or wearing improper footwear. Working at a dangerous job. Having any of the following in your home: Tripping hazards, such as floor clutter or loose rugs. Poor lighting. Pets. Having dementia or memory loss. What actions can I take to lower my risk of falling?     Physical activity Stay physically fit. Do strength and balance exercises. Consider taking a regular class to build strength and balance. Yoga and tai chi are good options. Vision Have your eyes checked every year and your prescription for glasses or contacts updated as needed. Shoes and walking aids Wear non-skid shoes. Wear shoes that have rubber soles and low heels. Do not wear high heels. Do not walk around the house in socks or slippers. Use a cane or walker as told by your provider. Home safety Attach secure railings on both sides of your stairs. Install grab bars for your bathtub, shower, and toilet. Use a non-skid mat in your bathtub or shower. Attach bath mats securely with double-sided, non-slip rug tape. Use good lighting in all rooms. Keep a flashlight near your bed. Make sure there is a clear path from your bed to the bathroom. Use night-lights. Do not use throw rugs. Make sure all carpeting is taped or tacked down securely. Remove all clutter from walkways and stairways, including extension cords. Repair uneven or broken steps and floors. Avoid walking on icy or slippery surfaces. Walk on the grass instead of on icy or slick  sidewalks. Use ice melter to get rid of ice on walkways in the winter. Use a cordless phone. Questions to ask your health care provider Can you help me check my risk for a fall? Do any of my medicines make me more likely to fall? Should I take a vitamin D  supplement? What exercises can I do to improve my strength and balance? Should I make an appointment to have my vision checked? Do I need a bone density test to check for weak bones (osteoporosis)? Would it help to use a cane or a walker? Where to find more information Centers for Disease Control and Prevention, STEADI: TonerPromos.no Community-Based Fall Prevention Programs:  TonerPromos.no [employer] on Aging: BaseRingTones.pl Contact a health care provider if: You fall at home. You are afraid of falling at home. You feel weak, drowsy, or dizzy. This information is not intended to replace advice given to you by your health care provider. Make sure you discuss any questions you have with your health care provider. Document Revised: 01/30/2022 Document Reviewed: 01/30/2022 Elsevier Patient Education  2024 [employer].

## 2023-11-13 NOTE — Progress Notes (Signed)
 Subjective:   Craig Shaffer is a 63 y.o. who presents for a Medicare Wellness preventive visit.  As a reminder, Annual Wellness Visits don't include a physical exam, and some assessments may be limited, especially if this visit is performed virtually. We may recommend an in-person follow-up visit with your provider if needed.  Visit Complete: Virtual I connected with  Craig Shaffer on 11/13/23 by a audio enabled telemedicine application and verified that I am speaking with the correct person using two identifiers.  Patient Location: Home  Provider Location: Home Office  I discussed the limitations of evaluation and management by telemedicine. The patient expressed understanding and agreed to proceed.  Vital Signs: Because this visit was a virtual/telehealth visit, some criteria may be missing or patient reported. Any vitals not documented were not able to be obtained and vitals that have been documented are patient reported.  VideoDeclined- This patient declined Librarian, academic. Therefore the visit was completed with audio only.  Persons Participating in Visit: Patient.  AWV Questionnaire: Yes: Patient Medicare AWV questionnaire was completed by the patient on 11/12/2023; I have confirmed that all information answered by patient is correct and no changes since this date.  Cardiac Risk Factors include: advanced age (>89men, >61 women);hypertension;smoking/ tobacco exposure;male gender;dyslipidemia     Objective:     Today's Vitals   11/13/23 1352  Weight: 162 lb (73.5 kg)  Height: 5\' 8"  (1.727 m)   Body mass index is 24.63 kg/m.     11/13/2023    2:02 PM 09/04/2022    8:42 AM 04/09/2022    7:19 AM 08/29/2021    8:14 AM 08/05/2021    9:06 AM 06/17/2018    8:19 AM 07/18/2017    1:58 PM  Advanced Directives  Does Patient Have a Medical Advance Directive? No No No No No No No  Would patient like information on creating a medical advance  directive? No - Patient declined Yes (ED - Information included in AVS) No - Patient declined No - Patient declined No - Patient declined No - Patient declined No - Patient declined    Current Medications (verified) Outpatient Encounter Medications as of 11/13/2023  Medication Sig   atorvastatin  (LIPITOR) 40 MG tablet TAKE 1 TABLET(40 MG) BY MOUTH DAILY   Cholecalciferol (VITAMIN D3) 25 MCG (1000 UT) CAPS Take 1 capsule (1,000 Units total) by mouth daily.   diltiazem  (TIAZAC ) 360 MG 24 hr capsule Take 1 capsule (360 mg total) by mouth daily.   olmesartan  (BENICAR ) 20 MG tablet Take 1 tablet (20 mg total) by mouth daily.   No facility-administered encounter medications on file as of 11/13/2023.    Allergies (verified) Spironolactone    History: Past Medical History:  Diagnosis Date   Anemia 2012 MCV 76.2 HB 12.3 CR 1.0-1.24   FERRITIN 14    Demyelinating disease of central nervous system [facility])    ED (erectile dysfunction)    Elevated PSA    GERD (gastroesophageal reflux disease)    Hyperlipidemia LDL goal <100 09/20/2021   Hypertension    Nicotine  addiction    Past Surgical History:  Procedure Laterality Date   APPENDECTOMY  04/2016   BIOPSY  08/05/2021   Procedure: BIOPSY;  Surgeon: Vinetta Greening, DO;  [facility];  Service: Endoscopy;;   CHOLECYSTECTOMY  5/07   COLONOSCOPY  2002 DR SMITH   [place] POLYP   COLONOSCOPY  JAN 2013 [facility] ANEMIA   H POLYP, IH   COLONOSCOPY  WITH PROPOFOL  N/A 08/05/2021   Procedure: COLONOSCOPY WITH PROPOFOL ;  Surgeon: Vinetta Greening, DO;  Location: [facility];  Service: Endoscopy;  Laterality: N/A;  10:30 / ASA 2   GIVENS CAPSULE STUDY  10/05/2011   Procedure: GIVENS CAPSULE STUDY;  Surgeon: Alyce Jubilee, MD;  Location: [facility];  Service: Endoscopy;  Laterality: N/A;   LAPAROSCOPIC APPENDECTOMY N/A 05/11/2016   Procedure: APPENDECTOMY LAPAROSCOPIC;  Surgeon: Alanda Allegra, MD;  Location: [facility] ORS;  Service: General;  Laterality: N/A;    POLYPECTOMY  08/05/2021   Procedure: POLYPECTOMY;  Surgeon: Vinetta Greening, DO;  Location: [facility];  Service: Endoscopy;;   UPPER GASTROINTESTINAL ENDOSCOPY  JAN 2013 ANEMIA   GASTRITIS, NL DUO Bx   Family History  Problem Relation Age of Onset   Hypertension Mother    Cancer Mother 6       metastatized to liver, died at age 90   Hypertension Sister    Hypertension Brother    Hypertension Sister    Hypertension Sister    Stroke Sister    Hypertension Sister    Heart disease Sister 62       stents in 4 valves, ruptd aorta   Hypertension Brother    Colon cancer Neg Hx    Social History   Socioeconomic History   Marital status: Single    Spouse name: Not on file   Number of children: 0   Years of education: 12   Highest education level: 12th grade  Occupational History    Employer: UNEMPLOYED  Tobacco Use   Smoking status: Never    Passive exposure: Yes   Smokeless tobacco: Never   Tobacco comments:       Vaping Use   Vaping status: Never Used  Substance and Sexual Activity   Alcohol use: No   Drug use: No   Sexual activity: Yes  Other Topics Concern   Not on file  Social History Narrative   Lives with sister and aunt. Aunt passed away two weeks ago    Social Drivers of Corporate investment banker Strain: Low Risk  (11/13/2023)   Overall Financial Resource Strain (CARDIA)    Difficulty of Paying Living Expenses: Not hard at all  Food Insecurity: No Food Insecurity (11/13/2023)   Hunger Vital Sign    Worried About Running Out of Food in the Last Year: Never true    Ran Out of Food in the Last Year: Never true  Transportation Needs: No Transportation Needs (11/13/2023)   PRAPARE - Administrator, Civil Service (Medical): No    Lack of Transportation (Non-Medical): No  Physical Activity: Sufficiently Active (11/13/2023)   Exercise Vital Sign    Days of Exercise per Week: 7 days    Minutes of Exercise per Session: 30 min  Recent Concern:  Physical Activity - Inactive (10/27/2023)   Exercise Vital Sign    Days of Exercise per Week: 0 days    Minutes of Exercise per Session: 20 min  Stress: No Stress Concern Present (11/13/2023)   [employer] of Occupational Health - Occupational Stress Questionnaire    Feeling of Stress : Not at all  Social Connections: Moderately Isolated (11/13/2023)   Social Connection and Isolation Panel [NHANES]    Frequency of Communication with Friends and Family: More than three times a week    Frequency of Social Gatherings with Friends and Family: More than three times a week    Attends Religious Services:  More than 4 times per year    Active Member of Clubs or Organizations: No    Attends Banker Meetings: Never    Marital Status: Never married    Tobacco Counseling Counseling given: Yes Tobacco comments:      Clinical Intake:  Pre-visit preparation completed: Yes  Pain : No/denies pain     BMI - recorded: 24.63 Nutritional Status: BMI of 19-24  Normal Nutritional Risks: None Diabetes: No  Lab Results  Component Value Date   HGBA1C 5.4 11/12/2019   HGBA1C 5.7 (H) 08/09/2015   HGBA1C 5.7 (H) 03/06/2015     How often do you need to have someone help you when you read instructions, pamphlets, or other written materials from your doctor or pharmacy?: 1 - Never  Interpreter Needed?: No  Information entered by :: Sally Crazier CMA   Activities of Daily Living     11/13/2023    1:54 PM  In your present state of health, do you have any difficulty performing the following activities:  Hearing? 0  Vision? 0  Difficulty concentrating or making decisions? 0  Walking or climbing stairs? 0  Dressing or bathing? 0  Doing errands, shopping? 0  Preparing Food and eating ? N  Using the Toilet? N  In the past six months, have you accidently leaked urine? N  Do you have problems with loss of bowel control? N  Managing your Medications? N  Managing your Finances? N   Housekeeping or managing your Housekeeping? N    Patient Care Team: Towanda Fret, MD as PCP - [facility], Quinton Buckler, MD (Inactive) (Gastroenterology) Homero Luster, MD as Attending Physician (Urology)  I have updated your Care Teams any recent Medical Services you may have received from other providers in the past year.     Assessment:    This is a routine wellness examination for Kaidin.  Hearing/Vision screen Hearing Screening - Comments:: Patient denies any hearing difficulties.  Vision Screening - Comments:: Patient is not up to date on yearly eye exams.  Patient declined referral to establish with an ophthalmologist.     Goals Addressed             This Visit's Progress    DIET - DECREASE SODA OR JUICE INTAKE   On track    Drinks 1 liter out of a whole week      Quit smoking / using tobacco   On track    Still a 1/4 pack a day. Still working on reducing even more       Depression Screen     11/13/2023    1:59 PM 10/31/2023    8:05 AM 05/09/2023    9:05 AM 05/03/2023    3:00 PM 09/12/2022    8:20 AM 04/24/2022    8:40 AM 04/10/2022    2:55 PM  PHQ 2/9 Scores  PHQ - 2 Score 0 0 0 0 0 0 0  PHQ- 9 Score 0          Fall Risk     11/13/2023    2:04 PM 10/31/2023    8:05 AM 05/09/2023    9:05 AM 05/03/2023    3:00 PM 09/12/2022    8:19 AM  Fall Risk   Falls in the past year? 0 0 0 0 0  Number falls in past yr: 0 0 0 0 0  Injury with Fall? 0 0 0 0 0  Risk for fall due to : No Fall  Risks  No Fall Risks No Fall Risks No Fall Risks  Follow up Falls evaluation completed Falls evaluation completed Falls evaluation completed Falls evaluation completed Falls evaluation completed    MEDICARE RISK AT HOME:  Medicare Risk at Home Any stairs in or around the home?: No If so, are there any without handrails?: No Home free of loose throw rugs in walkways, pet beds, electrical cords, etc?: Yes Adequate lighting in your home to reduce risk of falls?: Yes Life  alert?: No Use of a cane, walker or w/c?: No Grab bars in the bathroom?: No Shower chair or bench in shower?: No Elevated toilet seat or a handicapped toilet?: No  TIMED UP AND GO:  Was the test performed?  No  Cognitive Function: 6CIT completed        11/13/2023    2:05 PM 09/04/2022    8:47 AM 08/29/2021    8:11 AM 08/24/2020    8:13 AM 08/24/2020    8:12 AM  6CIT Screen  What Year? 0 points 0 points 0 points 0 points 0 points  What month? 0 points 0 points 0 points 0 points 0 points  What time? 0 points 0 points 0 points 0 points 0 points  Count back from 20 0 points 0 points 0 points 0 points 0 points  Months in reverse 0 points 0 points 0 points 0 points 0 points  Repeat phrase 0 points 0 points 0 points 0 points 0 points  Total Score 0 points 0 points 0 points 0 points 0 points    Immunizations Immunization History  Administered Date(s) Administered   DTaP 02/24/2021   Fluad Quad(high Dose 65+) 04/19/2023   Influenza,inj,Quad PF,6+ Mos 04/04/2013, 03/16/2014, 03/10/2015, 03/08/2022   Moderna Covid-19 Vaccine Bivalent Booster 28yrs & up 04/19/2023   PFIZER(Purple Top)SARS-COV-2 Vaccination 12/26/2019, 01/26/2020, 08/11/2020   PNEUMOCOCCAL CONJUGATE-20 10/31/2023   Td 02/18/2004   Tdap 02/24/2021   Zoster Recombinant(Shingrix) 08/26/2020   Zoster, Unspecified 01/04/2021    Screening Tests Health Maintenance  Topic Date Due   COVID-19 Vaccine (5 - 2024-25 season) 06/14/2023   INFLUENZA VACCINE  01/11/2024   Medicare Annual Wellness (AWV)  11/12/2024   Colonoscopy  08/05/2026   DTaP/Tdap/Td (4 - Td or Tdap) 02/25/2031   Pneumonia Vaccine 65+ Years old  Completed   Hepatitis C Screening  Completed   HIV Screening  Completed   Zoster Vaccines- Shingrix  Completed   HPV VACCINES  Aged Out   Meningococcal B Vaccine  Aged Out   Lung Cancer Screening  Discontinued    Health Maintenance  Health Maintenance Due  Topic Date Due   COVID-19 Vaccine (5 - 2024-25  season) 06/14/2023   Health Maintenance Items Addressed: Referral sent to [facility] (smoker/hx smoking)  Additional Screening:  Vision Screening: Recommended annual ophthalmology exams for early detection of glaucoma and other disorders of the eye. Would you like a referral to an eye doctor? No    Dental Screening: Recommended annual dental exams for proper oral hygiene  Community Resource Referral / Chronic Care Management: CRR required this visit?  No   CCM required this visit?  No   Plan:    I have personally reviewed and noted the following in the patient's chart:   Medical and social history Use of alcohol, tobacco or illicit drugs  Current medications and supplements including opioid prescriptions. Patient is not currently taking opioid prescriptions. Functional ability and status Nutritional status Physical activity Advanced directives List of other physicians Hospitalizations,  surgeries, and ER visits in previous 12 months Vitals Screenings to include cognitive, depression, and falls Referrals and appointments  In addition, I have reviewed and discussed with patient certain preventive protocols, quality metrics, and best practice recommendations. A written personalized care plan for preventive services as well as general preventive health recommendations were provided to patient.   Aaleeyah Bias, CMA   11/13/2023   After Visit Summary: (MyChart) Due to this being a telephonic visit, the after visit summary with patients personalized plan was offered to patient via MyChart   Notes: Please refer to Routing Comments.

## 2023-12-11 ENCOUNTER — Other Ambulatory Visit: Payer: Self-pay | Admitting: Family Medicine

## 2023-12-12 ENCOUNTER — Encounter: Payer: Self-pay | Admitting: Family Medicine

## 2023-12-12 ENCOUNTER — Ambulatory Visit (INDEPENDENT_AMBULATORY_CARE_PROVIDER_SITE_OTHER): Admitting: Family Medicine

## 2023-12-12 VITALS — BP 128/80 | HR 75 | Resp 16 | Ht 68.0 in | Wt 159.0 lb

## 2023-12-12 DIAGNOSIS — I10 Essential (primary) hypertension: Secondary | ICD-10-CM

## 2023-12-12 DIAGNOSIS — Z720 Tobacco use: Secondary | ICD-10-CM

## 2023-12-12 DIAGNOSIS — E785 Hyperlipidemia, unspecified: Secondary | ICD-10-CM | POA: Diagnosis not present

## 2023-12-12 MED ORDER — DILTIAZEM HCL ER BEADS 360 MG PO CP24: 360.0000 mg | ORAL_CAPSULE | Freq: Every day | ORAL | 3 refills | Status: DC

## 2023-12-12 MED ORDER — OLMESARTAN MEDOXOMIL 20 MG PO TABS: 20.0000 mg | ORAL_TABLET | Freq: Every day | ORAL | 2 refills | Status: DC

## 2023-12-12 MED ORDER — ATORVASTATIN CALCIUM 40 MG PO TABS: 40.0000 mg | ORAL_TABLET | Freq: Every day | ORAL | 3 refills | Status: DC

## 2023-12-12 MED ORDER — OLMESARTAN MEDOXOMIL 20 MG PO TABS: 20.0000 mg | ORAL_TABLET | Freq: Every day | ORAL | 3 refills | Status: DC

## 2023-12-12 NOTE — Patient Instructions (Addendum)
 F/U in 3 months, call if you  need me sooner   NO SPIRONOLACTONE , has s/e of breast enlargement  New additional tablet is olmesartan  20 mg one daily for your blood pressure, please collect today and start tomorrow  Thanks for choosing [facility], we consider it a privelige to serve you.

## 2023-12-12 NOTE — Assessment & Plan Note (Signed)
 Controlled, no change in medication Hyperlipidemia:Low fat diet discussed and encouraged.   Lipid Panel  Lab Results  Component Value Date   CHOL 136 10/31/2023   HDL 56 10/31/2023   LDLCALC 65 10/31/2023   TRIG 77 10/31/2023   CHOLHDL 2.4 10/31/2023

## 2023-12-12 NOTE — Assessment & Plan Note (Signed)
Asked:confirms currently smokes cigarettes ?Assess: Unwilling to set a quit date, and not  cutting back ?Advise: needs to QUIT to reduce risk of cancer, cardio and cerebrovascular disease ?Assist: counseled for 5 minutes and literature provided ?Arrange: follow up in 2 to 4 months ? ?

## 2023-12-12 NOTE — Progress Notes (Signed)
   Craig Shaffer     MRN: [id number]      DOB: [birth year]  Chief Complaint  Patient presents with   Hypertension    6 week follow up. Has been running good at home, takes bp every morning     HPI Craig Shaffer is here for follow up and re-evaluation of chronic medical conditions, medication management and review of any available recent lab and radiology data.  Preventive health is updated, specifically  Cancer screening and Immunization.   Questions or concerns regarding consultations or procedures which the PT has had in the interim are  addressed. The PT denies any adverse reactions to current medications since the last visit.  There are no new concerns.  There are no specific complaints   ROS Denies recent fever or chills. Denies sinus pressure, nasal congestion, ear pain or sore throat. Denies chest congestion, productive cough or wheezing. Denies chest pains, palpitations and leg swelling Denies abdominal pain, nausea, vomiting,diarrhea or constipation.   Denies dysuria, frequency, hesitancy or incontinence. Denies joint pain, swelling and limitation in mobility. Denies headaches, seizures, numbness, or tingling. Denies depression, anxiety or insomnia. Denies skin break down or rash.   PE  BP 128/80   Pulse 75   Resp 16   Ht 5' 8 (1.727 m)   Wt 159 lb 0.6 oz (72.1 kg)   SpO2 98%   BMI 24.18 kg/m   Patient alert and oriented and in no cardiopulmonary distress.  HEENT: No facial asymmetry, EOMI,     Neck supple .  Chest: Clear to auscultation bilaterally.  CVS: S1, S2 no murmurs, no S3.Regular rate.  ABD: Soft non tender.   Ext: No edema  MS: Adequate ROM spine, shoulders, hips and knees.  Skin: Intact, no ulcerations or rash noted.  Psych: Good eye contact, normal affect. Memory intact not anxious or depressed appearing.  CNS: CN 2-12 intact, power,  normal throughout.no focal deficits noted.   Assessment & Plan  Essential  hypertension Controlled on current meds, BUT should not be on spironolactone  due to breast enlargement , stop spironolactone  and start olmesartan  DASH diet and commitment to daily physical activity for a minimum of 30 minutes discussed and encouraged, as a part of hypertension management. The importance of attaining a healthy weight is also discussed.     12/12/2023    8:44 AM 12/12/2023    8:21 AM 11/13/2023    1:52 PM 10/31/2023    8:27 AM 10/31/2023    8:04 AM 05/09/2023    9:05 AM 05/03/2023    2:59 PM  BP/Weight  Systolic BP 128 139 -- 148 146 124 120  Diastolic BP 80 77 -- 82 81 71 70  Wt. (Lbs)  159.04 162  162 157.12 159  BMI  24.18 kg/m2 24.63 kg/m2  24.63 kg/m2 23.89 kg/m2 24.18 kg/m2       Hyperlipidemia LDL goal <100 Controlled, no change in medication Hyperlipidemia:Low fat diet discussed and encouraged.   Lipid Panel  Lab Results  Component Value Date   CHOL 136 10/31/2023   HDL 56 10/31/2023   LDLCALC 65 10/31/2023   TRIG 77 10/31/2023   CHOLHDL 2.4 10/31/2023       Tobacco abuse Asked:confirms currently smokes cigarettes Assess: Unwilling to set a quit date, and not  cutting back Advise: needs to QUIT to reduce risk of cancer, cardio and cerebrovascular disease Assist: counseled for 5 minutes and literature provided Arrange: follow up in 2 to 4 months

## 2023-12-12 NOTE — Assessment & Plan Note (Signed)
 Controlled on current meds, BUT should not be on spironolactone  due to breast enlargement , stop spironolactone  and start olmesartan  DASH diet and commitment to daily physical activity for a minimum of 30 minutes discussed and encouraged, as a part of hypertension management. The importance of attaining a healthy weight is also discussed.     12/12/2023    8:44 AM 12/12/2023    8:21 AM 11/13/2023    1:52 PM 10/31/2023    8:27 AM 10/31/2023    8:04 AM 05/09/2023    9:05 AM 05/03/2023    2:59 PM  BP/Weight  Systolic BP 128 139 -- 148 146 124 120  Diastolic BP 80 77 -- 82 81 71 70  Wt. (Lbs)  159.04 162  162 157.12 159  BMI  24.18 kg/m2 24.63 kg/m2  24.63 kg/m2 23.89 kg/m2 24.18 kg/m2

## 2024-03-10 ENCOUNTER — Other Ambulatory Visit: Payer: Self-pay | Admitting: Family Medicine

## 2024-03-13 ENCOUNTER — Ambulatory Visit: Admitting: Family Medicine

## 2024-04-09 NOTE — Progress Notes (Signed)
   04/09/2024  Patient ID: Craig Shaffer, male   DOB: 05/04/1961, 63 y.o.   MRN: [id number]  Pharmacy Quality Measure Review  This patient is appearing on a report for being at risk of failing the adherence measure for hypertension (ACEi/ARB) medications this calendar year.   Medication: olmestartan  Last fill date: 04/02/2024 for 30 day supply  Insurance report was not up to date. No action needed at this time.   Lang Sieve, PharmD, BCGP Clinical Pharmacist  [phone]

## 2024-04-24 NOTE — Progress Notes (Signed)
 Reminder set to check adherence and contact pharmacy and/or patient 05/02/2024 to ensure olmesartan  is being filled. Absolute fail date for [facility] 05/13/2024.

## 2024-05-06 NOTE — Progress Notes (Signed)
 Pharmacy Quality Measure Review  This patient is appearing on a report for being at risk of failing the adherence measure for hypertension (ACEi/ARB) medications this calendar year.   Medication: olmesartan  20mg   Noted that olmesartan  20mg  filled 05/02/24, and sold 05/03/2024.   Future Appointments  Date Time Provider Department Center  05/13/2024  8:00 AM Antonetta Rollene BRAVO, MD [facility] [address]  11/17/2024 10:00 AM [facility]NUAL WELLNESS VISIT [facility] [address]     Insurance report was not up to date. No action needed at this time.

## 2024-05-13 ENCOUNTER — Encounter: Payer: Self-pay | Admitting: Family Medicine

## 2024-05-13 ENCOUNTER — Ambulatory Visit: Admitting: Family Medicine

## 2024-05-13 VITALS — BP 138/80 | HR 83 | Resp 16 | Ht 68.0 in | Wt 164.1 lb

## 2024-05-13 DIAGNOSIS — E785 Hyperlipidemia, unspecified: Secondary | ICD-10-CM | POA: Diagnosis not present

## 2024-05-13 DIAGNOSIS — Z720 Tobacco use: Secondary | ICD-10-CM

## 2024-05-13 DIAGNOSIS — I10 Essential (primary) hypertension: Secondary | ICD-10-CM

## 2024-05-13 DIAGNOSIS — Z125 Encounter for screening for malignant neoplasm of prostate: Secondary | ICD-10-CM

## 2024-05-13 DIAGNOSIS — E559 Vitamin D deficiency, unspecified: Secondary | ICD-10-CM | POA: Diagnosis not present

## 2024-05-13 DIAGNOSIS — Z0000 Encounter for general adult medical examination without abnormal findings: Secondary | ICD-10-CM

## 2024-05-13 MED ORDER — OLMESARTAN MEDOXOMIL 20 MG PO TABS: 20.0000 mg | ORAL_TABLET | Freq: Every day | ORAL | 3 refills | Status: DC

## 2024-05-13 MED ORDER — DILTIAZEM HCL ER BEADS 360 MG PO CP24: 360.0000 mg | ORAL_CAPSULE | Freq: Every day | ORAL | 3 refills | Status: AC

## 2024-05-13 MED ORDER — ATORVASTATIN CALCIUM 40 MG PO TABS: 40.0000 mg | ORAL_TABLET | Freq: Every day | ORAL | 3 refills | Status: AC

## 2024-05-13 NOTE — Patient Instructions (Addendum)
 Annual exam with Dr Tobie in 1 year  F/U in 6 months  Fasting lipid cmp  and EGFR today  Increase vegetables, fruit, water , white meat, and trade lemonade for sodas pls  Please work to cut cigars to 2 to 3 per day  It is important that you exercise regularly at least 30 minutes 5 times a week. If you develop chest pain, have severe difficulty breathing, or feel very tired, stop exercising immediately and seek medical attention   Work ar 5 pound weight loss!   Congrats on good health habits, keep working on them , the reward of good work is more work!   Fasting CBC, lipid, cmp and EGFR, PDA , TSH and vit D 1 week before follow up  Thanks for choosing [facility], we consider it a privelige to serve you.

## 2024-05-13 NOTE — Progress Notes (Signed)
   Craig Shaffer     MRN: [id number]      DOB: 1960-07-11  Chief Complaint  Patient presents with   Annual Exam    Cpe     HPI: Patient is in for annual physical exam. No other health concerns are expressed or addressed at the visit.  Recent labs, if available are reviewed. Immunization is reviewed , and  updated if needed.    PE; BP 138/80   Pulse 83   Resp 16   Ht 5' 8 (1.727 m)   Wt 164 lb 1.3 oz (74.4 kg)   SpO2 99%   BMI 24.95 kg/m   Pleasant male, alert and oriented x 3, in no cardio-pulmonary distress. Afebrile. HEENT No facial trauma or asymetry. Sinuses non tender. EOMI External ears normal,  Neck: supple, no adenopathy,JVD or thyromegaly.No bruits.  Chest: Clear to ascultation bilaterally.No crackles or wheezes. Non tender to palpation  Cardiovascular system; Heart sounds normal,  S1 and  S2 ,no S3.  No murmur, or thrill. Apical beat not displaced Peripheral pulses normal.  Abdomen: Soft, non tender, no organomegaly or masses. No bruits. Bowel sounds normal. No guarding, tenderness or rebound.    Musculoskeletal exam: Full ROM of spine, hips , shoulders and knees. No deformity ,swelling or crepitus noted. No muscle wasting or atrophy.   Neurologic: Cranial nerves 2 to 12 intact. Power, tone ,sensation normal throughout. No disturbance in gait. No tremor.  Skin: Intact, no ulceration, erythema , scaling or rash noted. Pigmentation normal throughout  Psych; Normal mood and affect. Judgement and concentration normal   Assessment & Plan:  Encounter for annual physical exam Annual exam as documented. Counseling done  re healthy lifestyle involving commitment to 150 minutes exercise per week, heart healthy diet, and attaining healthy weight.The importance of adequate sleep also discussed. Regular seat belt use and home safety, is also discussed. Changes in health habits are decided on by the patient with goals and time frames  set  for achieving them. Immunization and cancer screening needs are specifically addressed at this visit.   Tobacco abuse Asked:confirms currently smokes cigars on average 4/day Assess: Unwilling to set a quit date, but is planning on cutting back Advise: needs to QUIT to reduce risk of cancer, cardio and cerebrovascular disease Assist: counseled for 5 minutes and literature provided Arrange: follow up in 2 to 4 months

## 2024-05-13 NOTE — Assessment & Plan Note (Signed)

## 2024-05-13 NOTE — Assessment & Plan Note (Signed)
 Asked:confirms currently smokes cigars on average 4/day Assess: Unwilling to set a quit date, but is planning on cutting back Advise: needs to QUIT to reduce risk of cancer, cardio and cerebrovascular disease Assist: counseled for 5 minutes and literature provided Arrange: follow up in 2 to 4 months

## 2024-05-14 ENCOUNTER — Ambulatory Visit: Payer: Self-pay | Admitting: Family Medicine

## 2024-05-14 LAB — CMP14+EGFR
ALT: 17 IU/L (ref 0–44)
AST: 20 IU/L (ref 0–40)
Albumin: 3.9 g/dL (ref 3.9–4.9)
Alkaline Phosphatase: 95 IU/L (ref 47–123)
BUN/Creatinine Ratio: 9 — ABNORMAL LOW (ref 10–24)
BUN: 11 mg/dL (ref 8–27)
Bilirubin Total: <0.2 mg/dL (ref 0.0–1.2)
CO2: 24 mmol/L (ref 20–29)
Calcium: 9.1 mg/dL (ref 8.6–10.2)
Chloride: 103 mmol/L (ref 96–106)
Creatinine, Ser: 1.16 mg/dL (ref 0.76–1.27)
Globulin, Total: 3 g/dL (ref 1.5–4.5)
Glucose: 70 mg/dL (ref 70–99)
Potassium: 4.2 mmol/L (ref 3.5–5.2)
Sodium: 140 mmol/L (ref 134–144)
Total Protein: 6.9 g/dL (ref 6.0–8.5)
eGFR: 70 mL/min/1.73 (ref >59)

## 2024-05-14 LAB — LIPID PANEL
Chol/HDL Ratio: 2.2 ratio (ref 0.0–5.0)
Cholesterol, Total: 106 mg/dL (ref 100–199)
HDL: 48 mg/dL (ref >39)
LDL Chol Calc (NIH): 45 mg/dL (ref 0–99)
Triglycerides: 55 mg/dL (ref 0–149)
VLDL Cholesterol Cal: 13 mg/dL (ref 5–40)

## 2024-06-02 ENCOUNTER — Other Ambulatory Visit: Payer: Self-pay | Admitting: Family Medicine

## 2024-11-13 ENCOUNTER — Ambulatory Visit: Admitting: Family Medicine

## 2024-11-17 ENCOUNTER — Ambulatory Visit

## 2025-05-14 ENCOUNTER — Encounter: Admitting: Internal Medicine
